# Patient Record
Sex: Female | Born: 1935 | Race: White | Hispanic: No | Smoking: Never smoker
Health system: Southern US, Community
[De-identification: ages and names within clinical notes are randomized; demographics above are authoritative.]

## PROBLEM LIST (undated history)

## (undated) DIAGNOSIS — E785 Hyperlipidemia, unspecified: Secondary | ICD-10-CM

## (undated) DIAGNOSIS — I639 Cerebral infarction, unspecified: Secondary | ICD-10-CM

## (undated) HISTORY — DX: Hyperlipidemia, unspecified: E78.5

---

## 2006-10-06 ENCOUNTER — Emergency Department: Payer: Self-pay | Admitting: Emergency Medicine

## 2006-10-24 ENCOUNTER — Ambulatory Visit: Payer: Self-pay | Admitting: Orthopedic Surgery

## 2009-03-03 ENCOUNTER — Ambulatory Visit: Payer: Self-pay | Admitting: Obstetrics and Gynecology

## 2009-03-26 ENCOUNTER — Ambulatory Visit: Payer: Self-pay | Admitting: Obstetrics and Gynecology

## 2010-06-29 ENCOUNTER — Ambulatory Visit: Payer: Self-pay | Admitting: Ophthalmology

## 2010-07-06 ENCOUNTER — Ambulatory Visit: Payer: Self-pay | Admitting: Ophthalmology

## 2010-07-20 ENCOUNTER — Ambulatory Visit: Payer: Self-pay | Admitting: Ophthalmology

## 2017-06-15 ENCOUNTER — Emergency Department: Payer: Medicare Other

## 2017-06-15 ENCOUNTER — Inpatient Hospital Stay (HOSPITAL_COMMUNITY): Payer: Medicare Other

## 2017-06-15 ENCOUNTER — Encounter: Payer: Self-pay | Admitting: Emergency Medicine

## 2017-06-15 ENCOUNTER — Inpatient Hospital Stay (HOSPITAL_COMMUNITY)
Admission: EM | Admit: 2017-06-15 | Discharge: 2017-06-20 | DRG: 041 | Disposition: A | Discharge status: Rehabilitation Facility | Payer: Medicare Other | Attending: Neurology | Admitting: Neurology

## 2017-06-15 ENCOUNTER — Other Ambulatory Visit: Payer: Self-pay

## 2017-06-15 ENCOUNTER — Emergency Department
Admission: EM | Admit: 2017-06-15 | Discharge: 2017-06-15 | Disposition: A | Discharge status: Other Acute Inpatient Hospital | Payer: Medicare Other | Attending: Emergency Medicine | Admitting: Emergency Medicine

## 2017-06-15 DIAGNOSIS — I340 Nonrheumatic mitral (valve) insufficiency: Secondary | ICD-10-CM | POA: Diagnosis not present

## 2017-06-15 DIAGNOSIS — I63432 Cerebral infarction due to embolism of left posterior cerebral artery: Secondary | ICD-10-CM | POA: Diagnosis present

## 2017-06-15 DIAGNOSIS — K219 Gastro-esophageal reflux disease without esophagitis: Secondary | ICD-10-CM | POA: Diagnosis present

## 2017-06-15 DIAGNOSIS — E785 Hyperlipidemia, unspecified: Secondary | ICD-10-CM | POA: Diagnosis not present

## 2017-06-15 DIAGNOSIS — I6300 Cerebral infarction due to thrombosis of unspecified precerebral artery: Secondary | ICD-10-CM | POA: Diagnosis not present

## 2017-06-15 DIAGNOSIS — I639 Cerebral infarction, unspecified: Secondary | ICD-10-CM | POA: Insufficient documentation

## 2017-06-15 DIAGNOSIS — I6350 Cerebral infarction due to unspecified occlusion or stenosis of unspecified cerebral artery: Secondary | ICD-10-CM

## 2017-06-15 DIAGNOSIS — R29706 NIHSS score 6: Secondary | ICD-10-CM | POA: Diagnosis present

## 2017-06-15 DIAGNOSIS — I6329 Cerebral infarction due to unspecified occlusion or stenosis of other precerebral arteries: Secondary | ICD-10-CM | POA: Diagnosis not present

## 2017-06-15 DIAGNOSIS — I361 Nonrheumatic tricuspid (valve) insufficiency: Secondary | ICD-10-CM | POA: Diagnosis not present

## 2017-06-15 DIAGNOSIS — D649 Anemia, unspecified: Secondary | ICD-10-CM | POA: Diagnosis present

## 2017-06-15 DIAGNOSIS — R410 Disorientation, unspecified: Secondary | ICD-10-CM | POA: Diagnosis present

## 2017-06-15 DIAGNOSIS — R531 Weakness: Secondary | ICD-10-CM | POA: Diagnosis present

## 2017-06-15 DIAGNOSIS — I69320 Aphasia following cerebral infarction: Secondary | ICD-10-CM | POA: Diagnosis not present

## 2017-06-15 DIAGNOSIS — I69351 Hemiplegia and hemiparesis following cerebral infarction affecting right dominant side: Secondary | ICD-10-CM | POA: Diagnosis not present

## 2017-06-15 DIAGNOSIS — R29810 Facial weakness: Secondary | ICD-10-CM | POA: Diagnosis not present

## 2017-06-15 DIAGNOSIS — I69319 Unspecified symptoms and signs involving cognitive functions following cerebral infarction: Secondary | ICD-10-CM | POA: Diagnosis not present

## 2017-06-15 DIAGNOSIS — I69398 Other sequelae of cerebral infarction: Secondary | ICD-10-CM | POA: Diagnosis not present

## 2017-06-15 DIAGNOSIS — R29703 NIHSS score 3: Secondary | ICD-10-CM | POA: Diagnosis not present

## 2017-06-15 DIAGNOSIS — I10 Essential (primary) hypertension: Secondary | ICD-10-CM | POA: Diagnosis not present

## 2017-06-15 DIAGNOSIS — I6389 Other cerebral infarction: Secondary | ICD-10-CM | POA: Diagnosis not present

## 2017-06-15 DIAGNOSIS — R270 Ataxia, unspecified: Secondary | ICD-10-CM | POA: Diagnosis present

## 2017-06-15 DIAGNOSIS — H9190 Unspecified hearing loss, unspecified ear: Secondary | ICD-10-CM | POA: Diagnosis present

## 2017-06-15 DIAGNOSIS — G8321 Monoplegia of upper limb affecting right dominant side: Secondary | ICD-10-CM | POA: Diagnosis present

## 2017-06-15 DIAGNOSIS — R269 Unspecified abnormalities of gait and mobility: Secondary | ICD-10-CM | POA: Diagnosis not present

## 2017-06-15 DIAGNOSIS — I69390 Apraxia following cerebral infarction: Secondary | ICD-10-CM | POA: Diagnosis not present

## 2017-06-15 DIAGNOSIS — W1839XA Other fall on same level, initial encounter: Secondary | ICD-10-CM | POA: Diagnosis present

## 2017-06-15 DIAGNOSIS — R414 Neurologic neglect syndrome: Secondary | ICD-10-CM | POA: Diagnosis not present

## 2017-06-15 DIAGNOSIS — E538 Deficiency of other specified B group vitamins: Secondary | ICD-10-CM | POA: Diagnosis present

## 2017-06-15 DIAGNOSIS — K5900 Constipation, unspecified: Secondary | ICD-10-CM | POA: Diagnosis present

## 2017-06-15 DIAGNOSIS — I63332 Cerebral infarction due to thrombosis of left posterior cerebral artery: Secondary | ICD-10-CM | POA: Diagnosis not present

## 2017-06-15 DIAGNOSIS — R739 Hyperglycemia, unspecified: Secondary | ICD-10-CM | POA: Diagnosis not present

## 2017-06-15 DIAGNOSIS — Z9282 Status post administration of tPA (rtPA) in a different facility within the last 24 hours prior to admission to current facility: Secondary | ICD-10-CM

## 2017-06-15 DIAGNOSIS — I63532 Cerebral infarction due to unspecified occlusion or stenosis of left posterior cerebral artery: Secondary | ICD-10-CM | POA: Diagnosis not present

## 2017-06-15 HISTORY — DX: Cerebral infarction, unspecified: I63.9

## 2017-06-15 LAB — DIFFERENTIAL
BASOS PCT: 1 %
Basophils Absolute: 0 10*3/uL (ref 0–0.1)
EOS PCT: 2 %
Eosinophils Absolute: 0.1 10*3/uL (ref 0–0.7)
Lymphocytes Relative: 45 %
Lymphs Abs: 2 10*3/uL (ref 1.0–3.6)
MONO ABS: 0.4 10*3/uL (ref 0.2–0.9)
Monocytes Relative: 9 %
NEUTROS ABS: 1.9 10*3/uL (ref 1.4–6.5)
Neutrophils Relative %: 43 %

## 2017-06-15 LAB — COMPREHENSIVE METABOLIC PANEL
ALT: 19 U/L (ref 14–54)
AST: 33 U/L (ref 15–41)
Albumin: 4.1 g/dL (ref 3.5–5.0)
Alkaline Phosphatase: 76 U/L (ref 38–126)
Anion gap: 11 (ref 5–15)
BUN: 18 mg/dL (ref 6–20)
CALCIUM: 9.3 mg/dL (ref 8.9–10.3)
CHLORIDE: 105 mmol/L (ref 101–111)
CO2: 22 mmol/L (ref 22–32)
CREATININE: 0.76 mg/dL (ref 0.44–1.00)
Glucose, Bld: 127 mg/dL — ABNORMAL HIGH (ref 65–99)
Potassium: 4.1 mmol/L (ref 3.5–5.1)
SODIUM: 138 mmol/L (ref 135–145)
Total Bilirubin: 0.4 mg/dL (ref 0.3–1.2)
Total Protein: 6.7 g/dL (ref 6.5–8.1)

## 2017-06-15 LAB — CBC
HEMATOCRIT: 37 % (ref 35.0–47.0)
Hemoglobin: 12.8 g/dL (ref 12.0–16.0)
MCH: 32.8 pg (ref 26.0–34.0)
MCHC: 34.6 g/dL (ref 32.0–36.0)
MCV: 94.7 fL (ref 80.0–100.0)
PLATELETS: 145 10*3/uL — AB (ref 150–440)
RBC: 3.91 MIL/uL (ref 3.80–5.20)
RDW: 12.9 % (ref 11.5–14.5)
WBC: 4.4 10*3/uL (ref 3.6–11.0)

## 2017-06-15 LAB — ETHANOL

## 2017-06-15 LAB — TROPONIN I

## 2017-06-15 LAB — PROTIME-INR
INR: 0.97
PROTHROMBIN TIME: 12.8 s (ref 11.4–15.2)

## 2017-06-15 LAB — APTT: aPTT: 29 seconds (ref 24–36)

## 2017-06-15 MED ORDER — CLEVIDIPINE BUTYRATE 0.5 MG/ML IV EMUL: 0.0000 mg/h | INTRAVENOUS | Status: DC

## 2017-06-15 MED ORDER — SENNOSIDES-DOCUSATE SODIUM 8.6-50 MG PO TABS: 1.0000 | ORAL_TABLET | Freq: Every evening | ORAL | Status: DC | PRN

## 2017-06-15 MED ORDER — LABETALOL HCL 5 MG/ML IV SOLN
20.0000 mg | Freq: Once | INTRAVENOUS | Status: AC
  Administered 2017-06-15: 20 mg via INTRAVENOUS

## 2017-06-15 MED ORDER — ALTEPLASE 100 MG IV SOLR
INTRAVENOUS | Status: AC
  Administered 2017-06-15: 57 mg via INTRAVENOUS
  Filled 2017-06-15: qty 100

## 2017-06-15 MED ORDER — STROKE: EARLY STAGES OF RECOVERY BOOK
Freq: Once | Status: AC
  Administered 2017-06-16: 02:00:00
  Filled 2017-06-15: qty 1

## 2017-06-15 MED ORDER — ACETAMINOPHEN 650 MG RE SUPP: 650.0000 mg | RECTAL | Status: DC | PRN

## 2017-06-15 MED ORDER — ACETAMINOPHEN 160 MG/5ML PO SOLN: 650.0000 mg | ORAL | Status: DC | PRN

## 2017-06-15 MED ORDER — ACETAMINOPHEN 325 MG PO TABS: 650.0000 mg | ORAL_TABLET | ORAL | Status: DC | PRN

## 2017-06-15 MED ORDER — LABETALOL HCL 5 MG/ML IV SOLN
INTRAVENOUS | Status: AC
  Administered 2017-06-15: 20 mg via INTRAVENOUS
  Filled 2017-06-15: qty 4

## 2017-06-15 MED ORDER — IOPAMIDOL (ISOVUE-370) INJECTION 76%
100.0000 mL | Freq: Once | INTRAVENOUS | Status: AC | PRN
  Administered 2017-06-15: 100 mL via INTRAVENOUS

## 2017-06-15 MED ORDER — SODIUM CHLORIDE 0.9 % IV SOLN
INTRAVENOUS | Status: AC
  Administered 2017-06-16: 02:00:00 via INTRAVENOUS
  Administered 2017-06-16: 75 mL/h via INTRAVENOUS

## 2017-06-15 MED ORDER — PANTOPRAZOLE SODIUM 40 MG IV SOLR
40.0000 mg | Freq: Every day | INTRAVENOUS | Status: DC
  Administered 2017-06-16: 40 mg via INTRAVENOUS
  Filled 2017-06-15: qty 40

## 2017-06-15 MED ORDER — ALTEPLASE (STROKE) FULL DOSE INFUSION
0.9000 mg/kg | Freq: Once | INTRAVENOUS | Status: AC
  Administered 2017-06-15: 57 mg via INTRAVENOUS
  Filled 2017-06-15: qty 100

## 2017-06-15 MED ORDER — LABETALOL HCL 5 MG/ML IV SOLN: 20.0000 mg | Freq: Once | INTRAVENOUS | Status: DC

## 2017-06-15 MED ORDER — NICARDIPINE HCL IN NACL 20-0.86 MG/200ML-% IV SOLN
INTRAVENOUS | Status: AC
  Filled 2017-06-15: qty 200

## 2017-06-15 MED ORDER — SODIUM CHLORIDE 0.9 % IV SOLN: 50.0000 mL | Freq: Once | INTRAVENOUS | Status: DC

## 2017-06-15 MED ORDER — LABETALOL HCL 5 MG/ML IV SOLN
INTRAVENOUS | Status: AC
  Administered 2017-06-15: 20 mg
  Filled 2017-06-15: qty 4

## 2017-06-15 NOTE — Plan of Care (Signed)
Was called about this patient by Dr. Shaune PollackLord. 82 year old female presents with right visual field defect- status post TPA after tele neurology consult. Recommended stat CTA head and neck to rule out LVO as patient also had some mild aphasia in additional to right homonymous hemianopsia and mild right-sided weakness. Perfusion scan appears to have been done as well.   CTA to me shows a left P2 occlusion with small perfusion deficit in the left PCA territory.  She will not be a candidate for EMT. She'll be admitted to neuro ICU for further workup.

## 2017-06-15 NOTE — ED Triage Notes (Signed)
Pt transferred from [facility] via [facility]. Per [facility] LSW [id number] when pt had a fall and was found with right arm numbness and dizziness. [facility] she had garbled speech and right peripheral vision loss. Pt was given a total does of 57 TPA started at 1817 and stopped at approx 1905. Pt also given 2 doses of labetolol totaling 40mg  At unknown times. She had CT and CTA PTA to this facility. Pt has 18LAC 20RAC and 20Rwrist. Pt still has numbness and peripheral vision loss.

## 2017-06-15 NOTE — ED Notes (Signed)
MD requesting medication administration to lower BP prior to alteplase administration.

## 2017-06-15 NOTE — ED Notes (Signed)
Pt Blood pressure to high to give TPA. Labetalol is ordered.

## 2017-06-15 NOTE — ED Notes (Signed)
Spoke with Dr. Otelia LimesLindzen to check on bed request order for ICU s/p TPA

## 2017-06-15 NOTE — ED Notes (Signed)
Patient transported to CT with nurse. MD aware that pt is leaving room for CT scan.

## 2017-06-15 NOTE — ED Notes (Signed)
Pt blood pressure to elevated to give TPA. Labetalol ordered.

## 2017-06-15 NOTE — ED Provider Notes (Addendum)
[facility] Emergency Department Provider Note ____________________________________________   I have reviewed the triage vital signs and the triage nursing note.  HISTORY  Chief Complaint Cerebrovascular Accident   Historian Level 5 Caveat History Limited by patient with altered mental status. Granddaughter/niece provides history, she lives next door. Sister also came to the bedside Daughter by phone from [place]  HPI [name] is a 82 y.o. female with no history of known medical problems, lives alone and is very sharp according to family.  She lives next door to her great niece who his children were over at her house this afternoon when the patient apparently lowered herself to the floor.  The 52-year-old called her mother the patient's grand niece who came over immediately from next door and found the patient to be complaining of right arm and right leg numbness and stating that she could not feel them although she was moving all 4 extremities.  911was called and when EMS got there they report no focal weakness or numbness at that point but the patient was having some trouble following commands and confusion concerning for aphasia.  Symptoms are severe.   History reviewed. No pertinent past medical history.  There are no active problems to display for this patient.   History reviewed. No pertinent surgical history.  Prior to Admission medications   Not on File    No Known Allergies  No family history on file.  Social History Social History   Tobacco Use  . Smoking status: Never Smoker  Substance Use Topics  . Alcohol use: No    Frequency: Never  . Drug use: No    Review of Systems  Constitutional: Negative for recent illness. Eyes: Denies vision changes ENT: Negative for sore throat. Cardiovascular: Negative for chest pain. Respiratory: Negative for shortness of breath. Gastrointestinal: Negative for abdominal pain, vomiting and  diarrhea. Genitourinary: Negative for dysuria. Musculoskeletal: Negative for back pain. Skin: Negative for rash. Neurological: Negative for headache.  Positive for dizziness.  ____________________________________________   PHYSICAL EXAM:  VITAL SIGNS: ED Triage Vitals  Enc Vitals Group     BP      Pulse      Resp      Temp      Temp src      SpO2      Weight      Height      Head Circumference      Peak Flow      Pain Score      Pain Loc      Pain Edu?      Excl. in GC?      Constitutional: Alert and cooperative, but somewhat slow to answer questions, appears to have receptive aphasia. Well appearing and in no distress. HEENT   Head: Normocephalic and atraumatic.      Eyes: Conjunctivae are normal. Pupils equal and round.  Right sided hemi-anopsia      Ears:         Nose: No congestion/rhinnorhea.   Mouth/Throat: Mucous membranes are moist.   Neck: No stridor. Cardiovascular/Chest: Normal rate, regular rhythm.  No murmurs, rubs, or gallops. Respiratory: Normal respiratory effort without tachypnea nor retractions. Breath sounds are clear and equal bilaterally. No wheezes/rales/rhonchi. Gastrointestinal: Soft. No distention, no guarding, no rebound. Nontender.    Genitourinary/rectal:Deferred Musculoskeletal: Nontender with normal range of motion in all extremities. No joint effusions.  No lower extremity tenderness.  No edema. Neurologic: Slow to answer questions.  Initially had  problems following commands consistent with receptive aphasia.  Initially was unable to name common objects.  No drift or obvious motor deficit in 4 extremities.  On arrival had mild left-sided facial droop questionably.  No apparent sensory deficit, she stated that her sensation was normal bilaterally.  Skin:  Skin is warm, dry and intact. No rash noted. Psychiatric: No agitation   ____________________________________________  LABS (pertinent positives/negatives) I, Governor Rooks,  MD the attending physician have reviewed the labs noted below.  Labs Reviewed  CBC - Abnormal; Notable for the following components:      Result Value   Platelets 145 (*)    All other components within normal limits  COMPREHENSIVE METABOLIC PANEL - Abnormal; Notable for the following components:   Glucose, Bld 127 (*)    All other components within normal limits  ETHANOL  PROTIME-INR  APTT  DIFFERENTIAL  TROPONIN I  URINE DRUG SCREEN, QUALITATIVE ([facility] ONLY)  URINALYSIS, ROUTINE W REFLEX MICROSCOPIC    ____________________________________________    EKG I, Governor Rooks, MD, the attending physician have personally viewed and interpreted all ECGs.  None ____________________________________________  RADIOLOGY All Xrays were viewed by me.  Imaging interpreted by Radiologist, and I, Governor Rooks, MD the attending physician have reviewed the radiologist interpretation noted below.  Ct head without contrast:  IMPRESSION: Negative head CT.  These results were called by telephone at the time of interpretation on 06/15/2017 at 5:27 pm to Dr. Governor Rooks , who verbally acknowledged these results.  CT angios head and neck as well as CT perfusion with contrast: Pending __________________________________________  PROCEDURES  Procedure(s) performed: None  Critical Care performed: CRITICAL CARE Performed by: Governor Rooks   Total critical care time: 110 minutes  Critical care time was exclusive of separately billable procedures and treating other patients.  Critical care was necessary to treat or prevent imminent or life-threatening deterioration.  Critical care was time spent personally by me on the following activities: development of treatment plan with patient and/or surrogate as well as nursing, discussions with consultants, evaluation of patient's response to treatment, examination of patient, obtaining history from patient or surrogate, ordering and performing  treatments and interventions, ordering and review of laboratory studies, ordering and review of radiographic studies, pulse oximetry and re-evaluation of patient's condition.    ____________________________________________  ED COURSE / ASSESSMENT AND PLAN  Pertinent labs & imaging results that were available during my care of the patient were reviewed by me and considered in my medical decision making (see chart for details).    Patient arrived with complaint of numbness to the right arm and right leg with acute mental status change with receptive and expressive aphasia while here.  Patient was initiated as a code stroke.  CT head was obtained expeditiously and I discussed with the radiologist and reviewed myself and this was negative.  On-call tele-neurologist was consulted and although the patient was actually improved from the standpoint of her receptive and expressive aphasia, and had no weakness or numbness on exam, she did have a focal right sided hemianopsia, right side visual field deficit in both eyes.  Risk and benefit of TPA was discussed with patient as well as her family including her sister and nephew in the room, and her daughter by phone.  Patient states she wants to try TPA.  Patient did have elevated blood pressure when she came in and got 1 dose of IV labetalol, and then had increased blood pressure again requiring second dose of IV labetalol.  Plan to start nicardipine drip.  I initiated transfer with [facility] neurology, Dr. Laurence SlateAroor, who accepted immediately and [facility] transport was sent.  Patient sent for CT angios head and neck to be done so that this could be reviewed by accepting neurologist to make decisions for potential large vessel intervention if needed as patient is in transport to [facility] to ED versus ICU depending on these imaging results.  Patient to be transported now despite results not back here.  Most of her neurologist has access to the  imaging.  CONSULTATIONS: Code stroke tele-neurologist, agree with recommendation for TPA.  Patient / Family / Caregiver informed of clinical course, medical decision-making process, and agree with plan.    ___________________________________________   FINAL CLINICAL IMPRESSION(S) / ED DIAGNOSES   Final diagnoses:  Cerebrovascular accident (CVA), unspecified mechanism (HCC)      ___________________________________________        Note: This dictation was prepared with Office managerDragon dictation. Any transcriptional errors that result from this process are unintentional    Governor RooksLord, Ellan Tess, MD 06/15/17 1840    Governor RooksLord, Amore Grater, MD 06/15/17 1924

## 2017-06-15 NOTE — ED Provider Notes (Signed)
[facility] EMERGENCY [facility]PART[facility] Provider Note   CSN: [id number] Arrival date & time: 06/15/17  [id number]     History   Chief Complaint No chief complaint on file.   HPI [name] is a 82 y.o. female.  HPI  82 year old female presents from [facility] as a transfer as a code stroke.  History is limited as the patient is mildly confused.  History is supplemented by the nurse who took transfer report as well as family at the bedside.  At around 3 PM today she all of a sudden felt dizzy and laid herself down to the ground.  She has been having right-sided vision loss and was complaining of right arm and leg numbness.  There was no apparent weakness.  She was given TPA at the outside facility and had a CT scan, CT angiography, and CT perfusion.  Neurology accepted patient as transfer.  No past medical history on file.  There are no active problems to display for this patient.   No past surgical history on file.  OB History    No data available       Home Medications    Prior to Admission medications   Not on File    Family History No family history on file.  Social History Social History   Tobacco Use  . Smoking status: Never Smoker  Substance Use Topics  . Alcohol use: No    Frequency: Never  . Drug use: No     Allergies   Patient has no known allergies.   Review of Systems Review of Systems  Unable to perform ROS: Mental status change     Physical Exam Updated Vital Signs BP (!) 172/82   Pulse 67   Temp 98.4 F (36.9 C) (Oral)   Resp 19   SpO2 99%   Physical Exam  Constitutional: She appears well-developed and well-nourished.  HENT:  Head: Normocephalic and atraumatic.  Right Ear: External ear normal.  Left Ear: External ear normal.  Nose: Nose normal.  Eyes: EOM are normal. Pupils are equal, round, and reactive to light. Right eye exhibits no discharge. Left eye exhibits no discharge.  Right  hemianopsia  Neck: Neck supple.  Cardiovascular: Normal rate, regular rhythm and normal heart sounds.  Pulmonary/Chest: Effort normal and breath sounds normal.  Abdominal: Soft. There is no tenderness.  Neurological: She is alert.  Awake, alert, but mild confusion. CN 3-12 grossly intact. 5/5 strength in all 4 extremities. Grossly normal sensation.   Skin: Skin is warm and dry.  Nursing note and vitals reviewed.    ED Treatments / Results  Labs (all labs ordered are listed, but only abnormal results are displayed) Labs Reviewed - No data to display  EKG  EKG Interpretation None       Radiology Ct Angio Head W Or Wo Contrast  Result Date: 06/15/2017 CLINICAL DATA:  Fall and right arm numbness with aphasia that resolved. Right homonymous hemianopia. EXAM: CT ANGIOGRAPHY HEAD AND NECK CT PERFUSION BRAIN TECHNIQUE: Multidetector CT imaging of the head and neck was performed using the standard protocol during bolus administration of intravenous contrast. Multiplanar CT image reconstructions and MIPs were obtained to evaluate the vascular anatomy. Carotid stenosis measurements (when applicable) are obtained utilizing NASCET criteria, using the distal internal carotid diameter as the denominator. Multiphase CT imaging of the brain was performed following IV bolus contrast injection. Subsequent parametric perfusion maps were calculated using RAPID software. CONTRAST:  100mL ISOVUE-370 IOPAMIDOL (  ISOVUE-370) INJECTION 76% COMPARISON:  Noncontrast head CT earlier today FINDINGS: CTA NECK FINDINGS Aortic arch: No acute finding.  Three vessel branching. Right carotid system: Mild atherosclerotic plaque at the ICA bulb. No beading, stenosis, or ulceration. Left carotid system: Mild atherosclerotic plaque at the common carotid bifurcation and ICA bulb. No stenosis, ulceration, or beading. Vertebral arteries: No proximal subclavian stenosis. Mild atheromatous plaque at the vertebral origins. No stenosis  or beading. Skeleton: No acute finding. Other neck: No incidental mass or inflammation. Upper chest: Negative Review of the MIP images confirms the above findings CTA HEAD FINDINGS Anterior circulation: No noted atheromatous changes. No branch occlusion or stenosis. Negative for beading or aneurysm. Posterior circulation: Vertebral and basilar arteries are smooth and widely patent. There is a left P2 segment occlusion or pre occlusion with flow gap. Venous sinuses: Patent Anatomic variants: None significant Delayed phase: No abnormal intracranial enhancement. Review of the MIP images confirms the above findings CT Brain Perfusion Findings: CBF (<30%) Volume: 0mL Perfusion (Tmax>6.0s) volume: 13mL These results were called by telephone at the time of interpretation on 06/15/2017 at 7:08 pm to Dr. Shaune Pollack. Patient has already been given tPA and is being transferred. IMPRESSION: 1. Left P2 segment occlusion with downstream reconstitution. No infarct by CTP but there is 13cc of measurable left occipital lobe penumbra. 2. Very mild atheromatous changes for age. No stenosis or noted embolic souce. Electronically Signed   By: Marnee Spring M.D.   On: 06/15/2017 19:20   Ct Head Wo Contrast  Result Date: 06/15/2017 CLINICAL DATA:  Altered level of consciousness.  Fall.  Aphasia. EXAM: CT HEAD WITHOUT CONTRAST TECHNIQUE: Contiguous axial images were obtained from the base of the skull through the vertex without intravenous contrast. COMPARISON:  None. FINDINGS: Brain: There is no evidence of acute infarct, intracranial hemorrhage, mass, midline shift, or extra-axial fluid collection. The ventricles and sulci are normal. Vascular: No hyperdense vessel. Skull: No fracture or suspicious osseous lesion. 5 mm sclerotic focus in the left frontal bone, possibly a bone island. Sinuses/Orbits: Visualized paranasal sinuses and mastoid air cells are clear. Bilateral cataract extraction. Other: None. IMPRESSION: Negative head CT. These  results were called by telephone at the time of interpretation on 06/15/2017 at 5:27 pm to Dr. Governor Rooks , who verbally acknowledged these results. Electronically Signed   By: Sebastian Ache M.D.   On: 06/15/2017 17:28   Ct Angio Neck W Or Wo Contrast  Result Date: 06/15/2017 CLINICAL DATA:  Fall and right arm numbness with aphasia that resolved. Right homonymous hemianopia. EXAM: CT ANGIOGRAPHY HEAD AND NECK CT PERFUSION BRAIN TECHNIQUE: Multidetector CT imaging of the head and neck was performed using the standard protocol during bolus administration of intravenous contrast. Multiplanar CT image reconstructions and MIPs were obtained to evaluate the vascular anatomy. Carotid stenosis measurements (when applicable) are obtained utilizing NASCET criteria, using the distal internal carotid diameter as the denominator. Multiphase CT imaging of the brain was performed following IV bolus contrast injection. Subsequent parametric perfusion maps were calculated using RAPID software. CONTRAST:  ISOVUE-370 IOPAMIDOL (ISOVUE-370) INJECTION 76% COMPARISON:  Noncontrast head CT earlier today FINDINGS: CTA NECK FINDINGS Aortic arch: No acute finding.  Three vessel branching. Right carotid system: Mild atherosclerotic plaque at the ICA bulb. No beading, stenosis, or ulceration. Left carotid system: Mild atherosclerotic plaque at the common carotid bifurcation and ICA bulb. No stenosis, ulceration, or beading. Vertebral arteries: No proximal subclavian stenosis. Mild atheromatous plaque at the vertebral origins. No stenosis or beading.  Skeleton: No acute finding. Other neck: No incidental mass or inflammation. Upper chest: Negative Review of the MIP images confirms the above findings CTA HEAD FINDINGS Anterior circulation: No noted atheromatous changes. No branch occlusion or stenosis. Negative for beading or aneurysm. Posterior circulation: Vertebral and basilar arteries are smooth and widely patent. There is a left P2  segment occlusion or pre occlusion with flow gap. Venous sinuses: Patent Anatomic variants: None significant Delayed phase: No abnormal intracranial enhancement. Review of the MIP images confirms the above findings CT Brain Perfusion Findings: CBF (<30%) Volume: 0mL Perfusion (Tmax>6.0s) volume: 13mL These results were called by telephone at the time of interpretation on 06/15/2017 at 7:08 pm to Dr. Shaune Pollack. Patient has already been given tPA and is being transferred. IMPRESSION: 1. Left P2 segment occlusion with downstream reconstitution. No infarct by CTP but there is 13cc of measurable left occipital lobe penumbra. 2. Very mild atheromatous changes for age. No stenosis or noted embolic souce. Electronically Signed   By: Marnee Spring M.D.   On: 06/15/2017 19:20   Ct Cerebral Perfusion W Contrast  Result Date: 06/15/2017 CLINICAL DATA:  Fall and right arm numbness with aphasia that resolved. Right homonymous hemianopia. EXAM: CT ANGIOGRAPHY HEAD AND NECK CT PERFUSION BRAIN TECHNIQUE: Multidetector CT imaging of the head and neck was performed using the standard protocol during bolus administration of intravenous contrast. Multiplanar CT image reconstructions and MIPs were obtained to evaluate the vascular anatomy. Carotid stenosis measurements (when applicable) are obtained utilizing NASCET criteria, using the distal internal carotid diameter as the denominator. Multiphase CT imaging of the brain was performed following IV bolus contrast injection. Subsequent parametric perfusion maps were calculated using RAPID software. CONTRAST:  ISOVUE-370 IOPAMIDOL (ISOVUE-370) INJECTION 76% COMPARISON:  Noncontrast head CT earlier today FINDINGS: CTA NECK FINDINGS Aortic arch: No acute finding.  Three vessel branching. Right carotid system: Mild atherosclerotic plaque at the ICA bulb. No beading, stenosis, or ulceration. Left carotid system: Mild atherosclerotic plaque at the common carotid bifurcation and ICA bulb. No  stenosis, ulceration, or beading. Vertebral arteries: No proximal subclavian stenosis. Mild atheromatous plaque at the vertebral origins. No stenosis or beading. Skeleton: No acute finding. Other neck: No incidental mass or inflammation. Upper chest: Negative Review of the MIP images confirms the above findings CTA HEAD FINDINGS Anterior circulation: No noted atheromatous changes. No branch occlusion or stenosis. Negative for beading or aneurysm. Posterior circulation: Vertebral and basilar arteries are smooth and widely patent. There is a left P2 segment occlusion or pre occlusion with flow gap. Venous sinuses: Patent Anatomic variants: None significant Delayed phase: No abnormal intracranial enhancement. Review of the MIP images confirms the above findings CT Brain Perfusion Findings: CBF (<30%) Volume: 0mL Perfusion (Tmax>6.0s) volume: 13mL These results were called by telephone at the time of interpretation on 06/15/2017 at 7:08 pm to Dr. Shaune Pollack. Patient has already been given tPA and is being transferred. IMPRESSION: 1. Left P2 segment occlusion with downstream reconstitution. No infarct by CTP but there is 13cc of measurable left occipital lobe penumbra. 2. Very mild atheromatous changes for age. No stenosis or noted embolic souce. Electronically Signed   By: Marnee Spring M.D.   On: 06/15/2017 19:20    Procedures Procedures (including critical care time)  Medications Ordered in ED Medications - No data to display   Initial Impression / Assessment and Plan / ED Course  I have reviewed the triage vital signs and the nursing notes.  Pertinent labs & imaging results that were  available during my care of the patient were reviewed by me and considered in my medical decision making (see chart for details).     Patient is awake, alert, mild confusion but this is not changed since 3 PM.  She does not feel numbness anymore but still has the right-sided vision loss.  She denies any headache but does feel  little dizzy.  At this point, she appears stable and somewhat improving since her trip to the outside ED.  Dr. Otelia Limes aware patient will admit to the ICU.  Reviewed patient's CT scans with him and no indication for IR treatment based on P2 clot.  Final Clinical Impressions(s) / ED Diagnoses   Final diagnoses:  Posterior circulation stroke [facility])    ED Discharge Orders    None       Pricilla Loveless, MD 06/15/17 2049

## 2017-06-15 NOTE — H&P (Signed)
Admission H&P    Chief Complaint: Left MCA stroke  HPI: [name] is an 82 y.o. female who was in her [place] on Thursday when she suddenly felt dizzy and had a "controlled fall" at 1545. She was unable to get up by herself and family noted her to be weak on the right. She was complaining of right arm numbness and dizziness. EMS transported the patient to [facility]. On arrival, she had garbled speech and right peripheral vision loss. CT head was negative for ICH. She was diagnosed with an acute stroke and tPA infusion started. She had CTA prior to transfer to [facility]. On arrival to [facility], the patient still had numbness and peripheral vision loss.   CTA head and neck with CTP:  1. Left P2 segment occlusion with downstream reconstitution. No infarct by CTP but there is 13cc of measurable left occipital lobe penumbra. 2. Very mild atheromatous changes for age. No stenosis or noted embolic souce.  LSN: [id number] tPA Given: Yes. Administered at [facility]  No past medical history on file.  No past surgical history on file.  No family history on file. Social History:  reports that  has never smoked. She does not have any smokeless tobacco history on file. She reports that she does not drink alcohol or use drugs.  Allergies: No Known Allergies  Home medications: She is not on any medications per family  ROS: No chest pain. Denies confusion. Other ROS as per HPI.   Physical Examination: Blood pressure (!) 165/96, pulse 66, temperature 98.4 F (36.9 C), temperature source Oral, resp. rate (!) 21, SpO2 99 %.  HEENT-  [place]/AT  Lungs - Respirations unlabored Extremities - No edema  Neurologic Examination: Mental Status: Awake and alert. Oriented to self, circumstance and state. Mild loss of intelligibility noted intermittently in the context of preserved fluency. Has difficulty with directional 2-step commands. Some impairment with naming. Cranial Nerves: II:  Right homonymous hemianopsia. PERRL.  III,IV, VI:  No ptosis. Able to gaze to left and right, with hesitancy on gazing to right. No nystagmus. V,VII: Subtle right facial droop. Facial FT sensation equal bilaterally.  VIII: Hearing intact to questions and commands IX,X: No hypophonia or hoarseness XI: No lag on left or right.  XII: midline tongue extension  Motor: RUE: Decreased tone. 4/5 proximal and distal strength.  RLE: 4+/5 proximal and distal LUE and LLE: 5/5 Sensory: FT sensation intact all 4 limbs when tested individually. Right sided extinction with DSS.  Deep Tendon Reflexes:  Right br and biceps: 3+ Left br and biceps: 2+ Right patella 3+ Right achilles: 2+ Left patella: 3+ Left achilles: 4+ (crossed adductor) Plantars: Equivocal bilaterally Cerebellar: Sensory ataxia with right FNF. Normal FNF on left.  Gait: Deferred due to acuity of presentation  Results for orders placed or performed during the hospital encounter of 06/15/17 (from the past 48 hour(s))  Ethanol     Status: None   Collection Time: 06/15/17  5:07 PM  Result Value Ref Range   Alcohol, Ethyl (B) <10 <10 mg/dL    Comment:        LOWEST DETECTABLE LIMIT FOR SERUM ALCOHOL IS 10 mg/dL FOR MEDICAL PURPOSES ONLY Performed at [facility], [address] [place], [place] [address]   Protime-INR     Status: None   Collection Time: 06/15/17  5:07 PM  Result Value Ref Range   Prothrombin Time 12.8 11.4 - 15.2 seconds   INR 0.97     Comment: Performed at  [facility], [address] [place], [place] [address]  APTT     Status: None   Collection Time: 06/15/17  5:07 PM  Result Value Ref Range   aPTT 29 24 - 36 seconds    Comment: Performed at [facility], [address] [place], [place] [address]  CBC     Status: Abnormal   Collection Time: 06/15/17  5:07 PM  Result Value Ref Range   WBC 4.4 3.6 - 11.0 K/uL   RBC 3.91 3.80 - 5.20 MIL/uL   Hemoglobin 12.8 12.0 - 16.0 g/dL   HCT 37.0 35.0 - 47.0 %   MCV 94.7 80.0 - 100.0  fL   MCH 32.8 26.0 - 34.0 pg   MCHC 34.6 32.0 - 36.0 g/dL   RDW 12.9 11.5 - 14.5 %   Platelets 145 (L) 150 - 440 K/uL    Comment: Performed at [facility], [address] [place], [place] [address]  Differential     Status: None   Collection Time: 06/15/17  5:07 PM  Result Value Ref Range   Neutrophils Relative % 43 %   Neutro Abs 1.9 1.4 - 6.5 K/uL   Lymphocytes Relative 45 %   Lymphs Abs 2.0 1.0 - 3.6 K/uL   Monocytes Relative 9 %   Monocytes Absolute 0.4 0.2 - 0.9 K/uL   Eosinophils Relative 2 %   Eosinophils Absolute 0.1 0 - 0.7 K/uL   Basophils Relative 1 %   Basophils Absolute 0.0 0 - 0.1 K/uL    Comment: Performed at [facility], [address] [place], [place] [address]  Comprehensive metabolic panel     Status: Abnormal   Collection Time: 06/15/17  5:07 PM  Result Value Ref Range   Sodium 138 135 - 145 mmol/L   Potassium 4.1 3.5 - 5.1 mmol/L   Chloride 105 101 - 111 mmol/L   CO2 22 22 - 32 mmol/L   Glucose, Bld 127 (H) 65 - 99 mg/dL   BUN 18 6 - 20 mg/dL   Creatinine, Ser 0.76 0.44 - 1.00 mg/dL   Calcium 9.3 8.9 - 10.3 mg/dL   Total Protein 6.7 6.5 - 8.1 g/dL   Albumin 4.1 3.5 - 5.0 g/dL   AST 33 15 - 41 U/L   ALT 19 14 - 54 U/L   Alkaline Phosphatase 76 38 - 126 U/L   Total Bilirubin 0.4 0.3 - 1.2 mg/dL   GFR calc non Af Amer >60 >60 mL/min   GFR calc Af Amer >60 >60 mL/min    Comment: (NOTE) The eGFR has been calculated using the CKD EPI equation. This calculation has not been validated in all clinical situations. eGFR's persistently <60 mL/min signify possible Chronic Kidney Disease.    Anion gap 11 5 - 15    Comment: Performed at [facility], [address] [place], [place] [address]  Troponin I     Status: None   Collection Time: 06/15/17  5:07 PM  Result Value Ref Range   Troponin I <0.03 <0.03 ng/mL    Comment: Performed at [facility], [address] [place], [place] [address]   Ct Angio Head W Or Wo  Contrast  Result Date: 06/15/2017 CLINICAL DATA:  Fall and right arm numbness with aphasia that resolved. Right homonymous hemianopia. EXAM: CT ANGIOGRAPHY HEAD AND NECK CT PERFUSION BRAIN TECHNIQUE: Multidetector CT imaging of the head and neck was performed using the standard protocol during bolus administration of  intravenous contrast. Multiplanar CT image reconstructions and MIPs were obtained to evaluate the vascular anatomy. Carotid stenosis measurements (when applicable) are obtained utilizing NASCET criteria, using the distal internal carotid diameter as the denominator. Multiphase CT imaging of the brain was performed following IV bolus contrast injection. Subsequent parametric perfusion maps were calculated using RAPID software. CONTRAST:  186m ISOVUE-370 IOPAMIDOL (ISOVUE-370) INJECTION 76% COMPARISON:  Noncontrast head CT earlier today FINDINGS: CTA NECK FINDINGS Aortic arch: No acute finding.  Three vessel branching. Right carotid system: Mild atherosclerotic plaque at the ICA bulb. No beading, stenosis, or ulceration. Left carotid system: Mild atherosclerotic plaque at the common carotid bifurcation and ICA bulb. No stenosis, ulceration, or beading. Vertebral arteries: No proximal subclavian stenosis. Mild atheromatous plaque at the vertebral origins. No stenosis or beading. Skeleton: No acute finding. Other neck: No incidental mass or inflammation. Upper chest: Negative Review of the MIP images confirms the above findings CTA HEAD FINDINGS Anterior circulation: No noted atheromatous changes. No branch occlusion or stenosis. Negative for beading or aneurysm. Posterior circulation: Vertebral and basilar arteries are smooth and widely patent. There is a left P2 segment occlusion or pre occlusion with flow gap. Venous sinuses: Patent Anatomic variants: None significant Delayed phase: No abnormal intracranial enhancement. Review of the MIP images confirms the above findings CT Brain Perfusion Findings:  CBF (<30%) Volume: 050mPerfusion (Tmax>6.0s) volume: 1365mhese results were called by telephone at the time of interpretation on 06/15/2017 at 7:08 pm to Dr. LorReita Clicheatient has already been given tPA and is being transferred. IMPRESSION: 1. Left P2 segment occlusion with downstream reconstitution. No infarct by CTP but there is 13cc of measurable left occipital lobe penumbra. 2. Very mild atheromatous changes for age. No stenosis or noted embolic souce. Electronically Signed   By: JonMonte FantasiaD.   On: 06/15/2017 19:20   Ct Head Wo Contrast  Result Date: 06/15/2017 CLINICAL DATA:  Altered level of consciousness.  Fall.  Aphasia. EXAM: CT HEAD WITHOUT CONTRAST TECHNIQUE: Contiguous axial images were obtained from the base of the skull through the vertex without intravenous contrast. COMPARISON:  None. FINDINGS: Brain: There is no evidence of acute infarct, intracranial hemorrhage, mass, midline shift, or extra-axial fluid collection. The ventricles and sulci are normal. Vascular: No hyperdense vessel. Skull: No fracture or suspicious osseous lesion. 5 mm sclerotic focus in the left frontal bone, possibly a bone island. Sinuses/Orbits: Visualized paranasal sinuses and mastoid air cells are clear. Bilateral cataract extraction. Other: None. IMPRESSION: Negative head CT. These results were called by telephone at the time of interpretation on 06/15/2017 at 5:27 pm to Dr. REBLisa Rocawho verbally acknowledged these results. Electronically Signed   By: AllLogan BoresD.   On: 06/15/2017 17:28   Ct Angio Neck W Or Wo Contrast  Result Date: 06/15/2017 CLINICAL DATA:  Fall and right arm numbness with aphasia that resolved. Right homonymous hemianopia. EXAM: CT ANGIOGRAPHY HEAD AND NECK CT PERFUSION BRAIN TECHNIQUE: Multidetector CT imaging of the head and neck was performed using the standard protocol during bolus administration of intravenous contrast. Multiplanar CT image reconstructions and MIPs were obtained to  evaluate the vascular anatomy. Carotid stenosis measurements (when applicable) are obtained utilizing NASCET criteria, using the distal internal carotid diameter as the denominator. Multiphase CT imaging of the brain was performed following IV bolus contrast injection. Subsequent parametric perfusion maps were calculated using RAPID software. CONTRAST:  100m48mOVUE-370 IOPAMIDOL (ISOVUE-370) INJECTION 76% COMPARISON:  Noncontrast head CT earlier today FINDINGS: CTA NECK  FINDINGS Aortic arch: No acute finding.  Three vessel branching. Right carotid system: Mild atherosclerotic plaque at the ICA bulb. No beading, stenosis, or ulceration. Left carotid system: Mild atherosclerotic plaque at the common carotid bifurcation and ICA bulb. No stenosis, ulceration, or beading. Vertebral arteries: No proximal subclavian stenosis. Mild atheromatous plaque at the vertebral origins. No stenosis or beading. Skeleton: No acute finding. Other neck: No incidental mass or inflammation. Upper chest: Negative Review of the MIP images confirms the above findings CTA HEAD FINDINGS Anterior circulation: No noted atheromatous changes. No branch occlusion or stenosis. Negative for beading or aneurysm. Posterior circulation: Vertebral and basilar arteries are smooth and widely patent. There is a left P2 segment occlusion or pre occlusion with flow gap. Venous sinuses: Patent Anatomic variants: None significant Delayed phase: No abnormal intracranial enhancement. Review of the MIP images confirms the above findings CT Brain Perfusion Findings: CBF (<30%) Volume: 59m Perfusion (Tmax>6.0s) volume: 170mThese results were called by telephone at the time of interpretation on 06/15/2017 at 7:08 pm to Dr. LoReita ClichePatient has already been given tPA and is being transferred. IMPRESSION: 1. Left P2 segment occlusion with downstream reconstitution. No infarct by CTP but there is 13cc of measurable left occipital lobe penumbra. 2. Very mild atheromatous  changes for age. No stenosis or noted embolic souce. Electronically Signed   By: JoMonte Fantasia.D.   On: 06/15/2017 19:20   Ct Cerebral Perfusion W Contrast  Result Date: 06/15/2017 CLINICAL DATA:  Fall and right arm numbness with aphasia that resolved. Right homonymous hemianopia. EXAM: CT ANGIOGRAPHY HEAD AND NECK CT PERFUSION BRAIN TECHNIQUE: Multidetector CT imaging of the head and neck was performed using the standard protocol during bolus administration of intravenous contrast. Multiplanar CT image reconstructions and MIPs were obtained to evaluate the vascular anatomy. Carotid stenosis measurements (when applicable) are obtained utilizing NASCET criteria, using the distal internal carotid diameter as the denominator. Multiphase CT imaging of the brain was performed following IV bolus contrast injection. Subsequent parametric perfusion maps were calculated using RAPID software. CONTRAST:  10058mSOVUE-370 IOPAMIDOL (ISOVUE-370) INJECTION 76% COMPARISON:  Noncontrast head CT earlier today FINDINGS: CTA NECK FINDINGS Aortic arch: No acute finding.  Three vessel branching. Right carotid system: Mild atherosclerotic plaque at the ICA bulb. No beading, stenosis, or ulceration. Left carotid system: Mild atherosclerotic plaque at the common carotid bifurcation and ICA bulb. No stenosis, ulceration, or beading. Vertebral arteries: No proximal subclavian stenosis. Mild atheromatous plaque at the vertebral origins. No stenosis or beading. Skeleton: No acute finding. Other neck: No incidental mass or inflammation. Upper chest: Negative Review of the MIP images confirms the above findings CTA HEAD FINDINGS Anterior circulation: No noted atheromatous changes. No branch occlusion or stenosis. Negative for beading or aneurysm. Posterior circulation: Vertebral and basilar arteries are smooth and widely patent. There is a left P2 segment occlusion or pre occlusion with flow gap. Venous sinuses: Patent Anatomic variants:  None significant Delayed phase: No abnormal intracranial enhancement. Review of the MIP images confirms the above findings CT Brain Perfusion Findings: CBF (<30%) Volume: 0mL35mrfusion (Tmax>6.0s) volume: 13mL9mse results were called by telephone at the time of interpretation on 06/15/2017 at 7:08 pm to Dr. Lord.Reita Clicheient has already been given tPA and is being transferred. IMPRESSION: 1. Left P2 segment occlusion with downstream reconstitution. No infarct by CTP but there is 13cc of measurable left occipital lobe penumbra. 2. Very mild atheromatous changes for age. No stenosis or noted embolic souce. Electronically Signed  By: Monte Fantasia M.D.   On: 06/15/2017 19:20    Assessment: 82 y.o. female with acute left PCA ischemic infarction 1. Status post IV tPA.  2. Not an endovascular candidate as occlusion is distal PCA and not accessible with catheter 3. Exam shows neurological deficits referable to the left occipital, parietal and temporal lobes, but per family and patient she has improved partially since tPA was administered 4. Stroke Risk Factors - None  Plan: 1. Admit to ICU under Neurology service 2. Post-tPA orders to include BP management and frequent neuro checks 3. MRI of the brain without contrast 3. PT consult, OT consult, Speech consult 4. TTE 5. No antiplatelet medications or anticoagulants for at least 24 hours following tPA. DVT prophylaxis with SCDs 6. Repeat CT in 24 hours. If negative for hemorrhagic conversion, can start ASA 7. Lipitor 40 mg po qd 8. Telemetry monitoring 9. HgbA1c, fasting lipid panel  45 minutes spent in the emergent Neurological evaluation and management of this acute stroke patient  Electronically signed: Dr. Kerney Elbe 06/15/2017, 9:55 PM

## 2017-06-15 NOTE — ED Notes (Signed)
Pt taken to MRI  

## 2017-06-15 NOTE — ED Notes (Signed)
Dr. Lindzen at bedside. 

## 2017-06-15 NOTE — ED Notes (Addendum)
Spoke with Dr. Otelia LimesLindzen about pt arrival in ED.

## 2017-06-15 NOTE — ED Notes (Signed)
Pt transported to [facility] stretcher in hallway. Pts family updated as best as possible. No further questions at this time.

## 2017-06-15 NOTE — Consult Note (Addendum)
[facility] TeleNeurology Consult Services Date of Service: 06/15/2017  Impression:  Patient with a fall and then right arm numbness and aphasia that resolved, but she has a clear right homonymous hemianopia.  Given the cortical findings, I am suspicious for possible LVO of the left MCA or PCA. Given persistent neurologic deficits, I reviewed the risks/benefits/alternatives to tPA as noted below.  The patient gave verbal consent to tPA.  CTA/CTP was done after tPA - 13 ml left occipital penumbra, left P2 occlusion but with distal reconstitution - she would not be a NIR candidate.  Differential Diagnosis:  1. Cardioembolic stroke  2. Small vessel disease/lacune  3. Thromboembolic, artery-to-artery mechanism  4. Hypercoagulable state-related infarct  5. Transient ischemic attack  6. Thrombotic mechanism, large artery disease   Comments:   Door time:  1644 [facility] contacted: 1735 [facility] at bedside: 1740 NIHSS assessment time: 1745 Verbal tpa order: 1751 Pre-tPA BP: 179/88 Finger stick glucose: 115 Needle time: 1812  Verbal Consent to tPA:  I have explained to the patient/family/guardian the nature of the patient's condition, the use of tPA fibrinolytic agent, and the benefits to be reasonably expected compared with alternative approaches. I have discussed the likelihood of major risks or complications of this procedure including (if applicable) but not limited to loss of limb function, brain damage, paralysis, hemorrhage, infection, complications from transfusion of blood components, drug reactions, blood clots and loss of life. I have also indicated that with any procedure there is always the possibility of an unexpected complication. I have explained the risks which include:    1. Death, Stroke or permanent neurologic injury (paralysis, coma, etc)   2. Worsening of stroke symptoms from swelling or bleeding in the brain   3. Bleeding in other parts of the body     4. Need for blood transfusions to replace blood or clotting factors   5. Allergic reaction to medications   6. Other unexpected complications    All questions were answered and the patient/family/guardian express understanding of the treatment plan and consent to the procedure.  Our recommendations are outlined below.   We will be seeing the patient back in follow up as noted.    Recommendations:   IV tPA - dose = 5.7mg  bolus, 51.3mg  infusion Routine post tPA monitoring including neuro checks and blood pressure control during/after treatment Monitor blood pressure Check blood pressure and NIHSS every 15 min for 2 h, then every 30 min for 6 h, and finally every hour for 16 h  Systolic greater than 180 OR diastolic greater than 105: Option 1: Labetalol 10 mg IV for 1 - 2 min May repeat or double labetalol every 10 min to maximum dose of 300 mg, or give initial labetalol dose, then start labetalol drip at 2 - 8 mg/min. Option 2: Nicardipine 5 mg/h IV infusion as initial dose and titrate to desired effect by increasing 2.5 mg/h every 5 min to maximum of 15 mg/h;  If blood pressure is not controlled by labetolol or nicardipine, consider sodium nitroprusside.  Admission to ICU CT brain 24 hours post tPA NPO until swallowing screen performed and passed No antiplatelet agents or anticoagulants (including heparin for DVT prophylaxis) in first 24 hours No Foley catheter, nasogastric tube, arterial catheter or central venous catheter for 24 hr, unless absolutely necessary Telemetry  Inpatient Neurology Consultation Stroke evaluation as per inpatient neurology recommendations Discussed with ED MD   ------------------------------------------------------------------------------  CC: stroke alert  History of Present Illness  Patient is a  82 year old woman with no medical history who presented to the ED after a fall.  Last seen normal by family at [id number] and a few minutes later they found the  patient on the ground.  She was unable to get up. She was complaining of right arm numbness.  On arrival to the ED she was not speaking clearly with aphasia.  Symptoms have improved, but she still seems confused when trying to read the stroke cards.  Diagnostic CT head wo - nothing acute  Exam  NIHSS score: 2 1a LOC: 0  1b Questions: 0  1c Commands: 0  2 Gaze: 0  3 VF: 2 4 Face: 0  5a Motor arm left: 0 5b Motor arm right: 0 6a Motor leg left: 0 6b Motor leg right: 0  7 Ataxia: 0  8 Sensory: 0  9: Language: 0  10: Speech: 0  11: Extinction: 0    Medical Decision Making:  - Extensive number of diagnosis or management options are considered above.   - Extensive amount of complex data reviewed.   - High risk of complication and/or morbidity or mortality are associated with differential diagnostic considerations above.  - There may be Uncertain outcome and increased probability of prolonged functional impairment or high probability of severe prolonged functional impairment associated with some of these differential diagnosis.  Medical Data Reviewed:  1.Data reviewed include clinical labs, radiology,  Medical Tests;   2.Tests results discussed w/performing or interpreting physician;   3.Obtaining/reviewing old medical records;  4.Obtaining case history from another source;  5.Independent review of image, tracing or specimen.   Medical Decision Making:  - Extensive number of diagnosis or management options are considered above.   - Extensive amount of complex data reviewed.   - High risk of complication and/or morbidity or mortality are associated with differential diagnostic considerations above.  - There may be Uncertain outcome and increased probability of prolonged functional impairment or high probability of severe prolonged functional impairment associated with some of these differential diagnosis.  Medical Data Reviewed:  1.Data reviewed include clinical labs, radiology,   Medical Tests;   2.Tests results discussed w/performing or interpreting physician;   3.Obtaining/reviewing old medical records;  4.Obtaining case history from another source;  5.Independent review of image, tracing or specimen.    Patient was informed the Neurology Consult would happen via telehealth (remote video) and consented to receiving care in this manner.

## 2017-06-15 NOTE — ED Triage Notes (Signed)
Pts family was at pt house at 345, and pt was seen falling to the ground. She was on her hands and knees, they did not see her hit her head. She was talking normal when EMS arrived and still on hands and knees. She stated to EMS that she could not feel her right arm but was still able to move it and still speaking clearly. Once pt arrived to the ER she was not speaking clearly. MD in room for evaluation and Code Stroke was called.

## 2017-06-15 NOTE — ED Notes (Signed)
Spoke with Dr. Otelia LimesLindzen about ICU bed request order. Awaiting order.

## 2017-06-15 NOTE — ED Notes (Signed)
Spoke with Dr. Otelia LimesLindzen after paging to ask for bed request order to move pt to ICU s/p TPA

## 2017-06-16 ENCOUNTER — Inpatient Hospital Stay (HOSPITAL_COMMUNITY): Payer: Medicare Other

## 2017-06-16 DIAGNOSIS — I361 Nonrheumatic tricuspid (valve) insufficiency: Secondary | ICD-10-CM

## 2017-06-16 DIAGNOSIS — I63332 Cerebral infarction due to thrombosis of left posterior cerebral artery: Secondary | ICD-10-CM

## 2017-06-16 DIAGNOSIS — E785 Hyperlipidemia, unspecified: Secondary | ICD-10-CM

## 2017-06-16 LAB — CBC
HEMATOCRIT: 35.8 % — AB (ref 36.0–46.0)
Hemoglobin: 11.9 g/dL — ABNORMAL LOW (ref 12.0–15.0)
MCH: 31.4 pg (ref 26.0–34.0)
MCHC: 33.2 g/dL (ref 30.0–36.0)
MCV: 94.5 fL (ref 78.0–100.0)
Platelets: 130 10*3/uL — ABNORMAL LOW (ref 150–400)
RBC: 3.79 MIL/uL — ABNORMAL LOW (ref 3.87–5.11)
RDW: 12.8 % (ref 11.5–15.5)
WBC: 7.9 10*3/uL (ref 4.0–10.5)

## 2017-06-16 LAB — BASIC METABOLIC PANEL
Anion gap: 7 (ref 5–15)
BUN: 10 mg/dL (ref 6–20)
CO2: 23 mmol/L (ref 22–32)
Calcium: 9.1 mg/dL (ref 8.9–10.3)
Chloride: 108 mmol/L (ref 101–111)
Creatinine, Ser: 0.72 mg/dL (ref 0.44–1.00)
GFR calc Af Amer: >60 mL/min (ref >60)
GFR calc non Af Amer: >60 mL/min (ref >60)
GLUCOSE: 110 mg/dL — AB (ref 65–99)
Potassium: 3.9 mmol/L (ref 3.5–5.1)
Sodium: 138 mmol/L (ref 135–145)

## 2017-06-16 LAB — VITAMIN B12: Vitamin B-12: 146 pg/mL — ABNORMAL LOW (ref 180–914)

## 2017-06-16 LAB — GLUCOSE, CAPILLARY: Glucose-Capillary: 115 mg/dL — ABNORMAL HIGH (ref 65–99)

## 2017-06-16 LAB — HEMOGLOBIN A1C
HEMOGLOBIN A1C: 5.2 % (ref 4.8–5.6)
Mean Plasma Glucose: 102.54 mg/dL

## 2017-06-16 LAB — LIPID PANEL
Cholesterol: 213 mg/dL — ABNORMAL HIGH (ref 0–200)
HDL: 80 mg/dL (ref >40)
LDL CALC: 120 mg/dL — AB (ref 0–99)
TRIGLYCERIDES: 65 mg/dL (ref <150)
Total CHOL/HDL Ratio: 2.7 RATIO
VLDL: 13 mg/dL (ref 0–40)

## 2017-06-16 LAB — MRSA PCR SCREENING: MRSA BY PCR: NEGATIVE

## 2017-06-16 LAB — TSH: TSH: 1.132 u[IU]/mL (ref 0.350–4.500)

## 2017-06-16 LAB — ECHOCARDIOGRAM COMPLETE
Height: 63 in
WEIGHTICAEL: 2130.53 [oz_av]

## 2017-06-16 MED ORDER — ATORVASTATIN CALCIUM 20 MG PO TABS
20.0000 mg | ORAL_TABLET | Freq: Every day | ORAL | Status: DC
  Administered 2017-06-16 – 2017-06-19 (×4): 20 mg via ORAL
  Filled 2017-06-16 (×3): qty 1
  Filled 2017-06-16: qty 2

## 2017-06-16 MED ORDER — ASPIRIN EC 325 MG PO TBEC
325.0000 mg | DELAYED_RELEASE_TABLET | Freq: Every day | ORAL | Status: DC
  Administered 2017-06-16 – 2017-06-19 (×4): 325 mg via ORAL
  Filled 2017-06-16 (×4): qty 1

## 2017-06-16 MED ORDER — PANTOPRAZOLE SODIUM 40 MG PO TBEC
40.0000 mg | DELAYED_RELEASE_TABLET | Freq: Every day | ORAL | Status: DC
  Administered 2017-06-16 – 2017-06-20 (×4): 40 mg via ORAL
  Filled 2017-06-16 (×5): qty 1

## 2017-06-16 MED ORDER — LABETALOL HCL 5 MG/ML IV SOLN: 10.0000 mg | INTRAVENOUS | Status: DC | PRN

## 2017-06-16 MED ORDER — ENOXAPARIN SODIUM 40 MG/0.4ML ~~LOC~~ SOLN
40.0000 mg | SUBCUTANEOUS | Status: DC
Start: 2017-06-17 — End: 2017-06-20
  Administered 2017-06-17 – 2017-06-20 (×4): 40 mg via SUBCUTANEOUS
  Filled 2017-06-16 (×5): qty 0.4

## 2017-06-16 NOTE — ED Notes (Signed)
Pt in CT at this time, unable to perform NIHSS and VS

## 2017-06-16 NOTE — Evaluation (Signed)
Physical Therapy Evaluation Patient Details Name: [name] MRN: [id number] DOB: 08-29-1935 Today's Date: 06/16/2017   History of Present Illness  82 yo admitted after episode of dizziness with fall with right weakness and vision loss. Pt with Left PCA infarct s/p tPA. No PMHx  Clinical Impression  Pt pleasant and eager to get up to bathroom on arrival with pt attempting to climb out of bed with all rails up. Pt needs max and frequent cues for safety and mobility as well as sequence for gait and transfers. Pt with decreased sensation RLE, decreased:balance, cognition, transfers, vision and safety who will benefit from acute therapy to maximize mobility, function, gait and safety to decrease burden of care. Family educated for visiting to right to increase pt attention as well as 2 person assist needed for safe mobility.     Follow Up Recommendations CIR;Supervision/Assistance - 24 hour    Equipment Recommendations  Rolling walker with 5" wheels;3in1 (PT)    Recommendations for Other Services Rehab consult     Precautions / Restrictions Precautions Precautions: Fall Precaution Comments: right inattention, right hemianopsia, ataxia      Mobility  Bed Mobility Overal bed mobility: Needs Assistance Bed Mobility: Supine to Sit     Supine to sit: Min guard;HOB elevated     General bed mobility comments: pt impulsive and trying to get OOB with all rails up. Pt with guarding for lines and safety and cues to decrease speed for awareness of lines  Transfers Overall transfer level: Needs assistance   Transfers: Sit to/from Stand Sit to Stand: Min assist         General transfer comment: min assist to stand from bed and toilet with cues for hand placement and safety  Ambulation/Gait Ambulation/Gait assistance: Mod assist;+2 safety/equipment Ambulation Distance (Feet): 30 Feet Assistive device: 2 person hand held assist Gait Pattern/deviations: Step-through pattern;Decreased  stride length;Leaning posteriorly;Narrow base of support   Gait velocity interpretation: Below normal speed for age/gender General Gait Details: pt with narrow BOS with right posterior lean with very short shuffling steps, not attending to right side with trunk rotated and partially side stepping to ambulate with cues for visual target, hand placement, sequence and safety  Stairs            Wheelchair Mobility    Modified Rankin (Stroke Patients Only) Modified Rankin (Stroke Patients Only) Pre-Morbid Rankin Score: No symptoms Modified Rankin: Moderately severe disability     Balance Overall balance assessment: Needs assistance   Sitting balance-Leahy Scale: Fair       Standing balance-Leahy Scale: Zero Standing balance comment: pt with bil UE support, maintained posterior right lean with lack of awareness for midline                             Pertinent Vitals/Pain Pain Assessment: No/denies pain    Home Living Family/patient expects to be discharged to:: Private residence Living Arrangements: Alone Available Help at Discharge: Family;Available 24 hours/day Type of Home: House       Home Layout: Two level;Laundry or work area in basement;Able to live on main level with [employer]: None      Prior Function Level of Independence: Independent               Hand Dominance        Extremity/Trunk Assessment   Upper Extremity Assessment Upper Extremity Assessment: Defer to OT evaluation    Lower Extremity Assessment  Lower Extremity Assessment: RLE deficits/detail RLE Deficits / Details: pt with functional strength, not formally assessed with decreaesed proprioception and awareness of position RLE Sensation: decreased proprioception    Cervical / Trunk Assessment Cervical / Trunk Assessment: Normal  Communication   Communication: No difficulties  Cognition Arousal/Alertness: Awake/alert Behavior During Therapy:  Impulsive Overall Cognitive Status: Impaired/Different from baseline Area of Impairment: Orientation;Attention;Memory;Following commands;Safety/judgement;Problem solving                 Orientation Level: Disoriented to;Time Current Attention Level: Sustained Memory: Decreased short-term memory Following Commands: Follows one step commands inconsistently;Follows one step commands with increased time Safety/Judgement: Decreased awareness of safety;Decreased awareness of deficits   Problem Solving: Requires tactile cues;Requires verbal cues;Decreased initiation        General Comments      Exercises     Assessment/Plan    PT Assessment Patient needs continued PT services  PT Problem List Decreased strength;Decreased mobility;Decreased safety awareness;Decreased coordination;Decreased activity tolerance;Decreased cognition;Decreased balance;Decreased knowledge of use of DME;Impaired sensation       PT Treatment Interventions Gait training;Therapeutic exercise;Patient/family education;Stair training;Balance training;Functional mobility training;Neuromuscular re-education;DME instruction;Therapeutic activities;Cognitive remediation    PT Goals (Current goals can be found in the Care Plan section)  Acute Rehab PT Goals Patient Stated Goal: return home PT Goal Formulation: With patient/family Time For Goal Achievement: 06/30/17 Potential to Achieve Goals: Fair    Frequency Min 4X/week   Barriers to discharge Decreased caregiver support pt lives alone but among family they believe they can provide 24hr care    Co-evaluation               AM-PAC PT "6 Clicks" Daily Activity  Outcome Measure Difficulty turning over in bed (including adjusting bedclothes, sheets and blankets)?: A Little Difficulty moving from lying on back to sitting on the side of the bed? : A Little Difficulty sitting down on and standing up from a chair with arms (e.g., wheelchair, bedside  commode, etc,.)?: Unable Help needed moving to and from a bed to chair (including a wheelchair)?: A Lot Help needed walking in hospital room?: A Lot Help needed climbing 3-5 steps with a railing? : Total 6 Click Score: 12    End of Session Equipment Utilized During Treatment: Gait belt Activity Tolerance: Patient tolerated treatment well Patient left: in chair;with call bell/phone within reach;with chair alarm set;with family/visitor present Nurse Communication: Mobility status;Precautions PT Visit Diagnosis: Other abnormalities of gait and mobility (R26.89);Other symptoms and signs involving the nervous system (R29.898);Difficulty in walking, not elsewhere classified (R26.2);Unsteadiness on feet (R26.81)    Time: 4098-11911224-1246 PT Time Calculation (min) (ACUTE ONLY): 22 min   Charges:   PT Evaluation $PT Eval Moderate Complexity: 1 Mod     PT G Codes:        Delaney MeigsMaija Tabor Ziair Penson, PT [phone]   Melvern Ramone B Teletha Petrea 06/16/2017, 12:59 PM

## 2017-06-16 NOTE — Evaluation (Signed)
Clinical/Bedside Swallow Evaluation Patient Details  Name: Cecile SheererReva R Sandra MRN: [id number] Date of Birth: 01/09/1936  Today's Date: 06/16/2017 Time: SLP Start Time (ACUTE ONLY): 1150 SLP Stop Time (ACUTE ONLY): 1220 SLP Time Calculation (min) (ACUTE ONLY): 30 min  Past Medical History: No past medical history on file. Past Surgical History: No past surgical history on file. HPI:  Rushie ChestnutReva R Terrellis an 82 y.o.femalewho was in her USOH on Thursday when she suddenly felt dizzy and had a "controlled fall" at 1545. She was unable to get up by herself and family noted her to be weak on the right. She was complaining ofright arm numbness and dizziness. [facility] On arrival,she had garbled speech and right peripheral vision loss.CT head was negative for ICH. She was diagnosed with an acute stroke and tPA infusion started.MRI shows Moderate sized acute ischemic nonhemorrhagic left PCA territory infarct involving the parasagittal left temporal occipital region.    Assessment / Plan / Recommendation Clinical Impression  Pt demosntrates no immediate signs of aspiration. Pts vocal quality slightly hoarse/wet at baseline, but did not change with PO or ever improve with throat clear and does not appear indicative of silent aspiration as pt does not seem to have any subjective oral or oropharyngeal neuromuscular impairment from this CVA. Will initiate a regular diet and thin liquids. SLP will f/u x1 for tolerance.       Aspiration Risk  Mild aspiration risk    Diet Recommendation Regular;Thin liquid   Liquid Administration via: Cup;Straw Medication Administration: Whole meds with liquid Supervision: Patient able to self feed Postural Changes: Seated upright at 90 degrees    Other  Recommendations Oral Care Recommendations: Oral care BID   Follow up Recommendations Inpatient Rehab      Frequency and Duration min 2x/week  2 weeks       Prognosis        Swallow  Study   General HPI: Rushie ChestnutReva R Terrellis an 82 y.o.femalewho was in her USOH on Thursday when she suddenly felt dizzy and had a "controlled fall" at 1545. She was unable to get up by herself and family noted her to be weak on the right. She was complaining ofright arm numbness and dizziness. [facility] On arrival,she had garbled speech and right peripheral vision loss.CT head was negative for ICH. She was diagnosed with an acute stroke and tPA infusion started.MRI shows Moderate sized acute ischemic nonhemorrhagic left PCA territory infarct involving the parasagittal left temporal occipital region.  Type of Study: Bedside Swallow Evaluation Previous Swallow Assessment: none Diet Prior to this Study: NPO Temperature Spikes Noted: No Respiratory Status: Room air History of Recent Intubation: No Behavior/Cognition: Alert;Cooperative;Pleasant mood Oral Cavity Assessment: Within Functional Limits Oral Care Completed by SLP: No Oral Cavity - Dentition: Adequate natural dentition Vision: Functional for self-feeding Self-Feeding Abilities: Able to feed self Patient Positioning: Upright in bed Baseline Vocal Quality: Wet;Hoarse Volitional Cough: Strong Volitional Swallow: Able to elicit    Oral/Motor/Sensory Function Overall Oral Motor/Sensory Function: Within functional limits   Ice Chips Ice chips: Not tested   Thin Liquid Thin Liquid: Within functional limits Presentation: Cup;Straw;Self Fed    Nectar Thick Nectar Thick Liquid: Not tested   Honey Thick Honey Thick Liquid: Not tested   Puree Puree: Within functional limits   Solid   GO   Solid: Within functional limits       [facility] Health CenterBonnie Deloris Moger, MA CCC-SLP [phone]  Claudine MoutonDeBlois, Apryle Stowell Caroline 06/16/2017,3:17 PM

## 2017-06-16 NOTE — ED Notes (Signed)
Pt in CT at this time, unable to perform NIHSS and VS 

## 2017-06-16 NOTE — ED Notes (Signed)
Upon Leaving CT, [facility] in hallway to transport pt to [facility].

## 2017-06-16 NOTE — ED Notes (Signed)
Pt in MRI unable to complete TPA assessment

## 2017-06-16 NOTE — Evaluation (Signed)
Occupational Therapy Evaluation Patient Details Name: [name] MRN: [id number] DOB: 10/11/1935 Today's Date: 06/16/2017    History of Present Illness 82 yo admitted after episode of dizziness with fall with right weakness and vision loss. Pt with Left PCA infarct s/p tPA. No PMHx   Clinical Impression   Pt reports she was independent with ADL PTA. Currently pt overall mod assist +2 for functional mobility and mod assist overall for ADL with cues for sequencing and safety. Pt presenting with impaired balance, R sided weakness/impaired coordination, R inattention and visual field deficits, impaired cognition impacting her independence and safety with ADL and functional mobility. Recommending CIR level therapies to maximize independence and safety with ADL and functional mobility prior to return home. Pt would benefit from continued skilled OT to address established goals.    Follow Up Recommendations  CIR;Supervision/Assistance - 24 hour    Equipment Recommendations  Other (comment)(TBD at next venue)    Recommendations for Other Services       Precautions / Restrictions Precautions Precautions: Fall Precaution Comments: right inattention, right hemianopsia, ataxia Restrictions Weight Bearing Restrictions: No      Mobility Bed Mobility      General bed mobility comments: Pt OOB in bathroom with PT upon arrival.  Transfers Overall transfer level: Needs assistance Equipment used: 1 person hand held assist Transfers: Sit to/from Stand Sit to Stand: Min assist         General transfer comment: min assist to stand from bed and toilet with cues for hand placement and safety    Balance Overall balance assessment: Needs assistance Sitting-balance support: Feet supported Sitting balance-Leahy Scale: Fair     Standing balance support: Bilateral upper extremity supported Standing balance-Leahy Scale: Zero Standing balance comment: pt with bil UE support, maintained  posterior right lean with lack of awareness for midline                           ADL either performed or assessed with clinical judgement   ADL Overall ADL's : Needs assistance/impaired     Grooming: Moderate assistance;Sitting;Oral care   Upper Body Bathing: Sitting;Moderate assistance   Lower Body Bathing: Moderate assistance;Sit to/from stand   Upper Body Dressing : Moderate assistance;Sitting   Lower Body Dressing: Moderate assistance;Sit to/from stand   Toilet Transfer: Moderate assistance;+2 for physical assistance;Ambulation;Comfort height toilet;Grab bars   Toileting- Clothing Manipulation and Hygiene: Minimal assistance;Sit to/from stand       Functional mobility during ADLs: Moderate assistance;+2 for physical assistance       Vision Baseline Vision/History: Wears glasses Wears Glasses: Reading only Patient Visual Report: No change from baseline Vision Assessment?: Vision impaired- to be further tested in functional context Additional Comments: R inattention, pt overshooting during functional tasks     Perception     Praxis      Pertinent Vitals/Pain Pain Assessment: No/denies pain     Hand Dominance Right   Extremity/Trunk Assessment Upper Extremity Assessment Upper Extremity Assessment: RUE deficits/detail RUE Deficits / Details: Grossly 3/5, impaired coordination during functional tasks, poor awareness of position in space RUE Sensation: decreased light touch;decreased proprioception RUE Coordination: decreased fine motor;decreased gross motor   Lower Extremity Assessment Lower Extremity Assessment: Defer to PT evaluation    Cervical / Trunk Assessment Cervical / Trunk Assessment: Normal   Communication Communication Communication: No difficulties   Cognition Arousal/Alertness: Awake/alert Behavior During Therapy: Impulsive Overall Cognitive Status: Impaired/Different from baseline Area of Impairment:  Orientation;Attention;Memory;Following  commands;Safety/judgement;Problem solving                 Orientation Level: Disoriented to;Time Current Attention Level: Sustained Memory: Decreased short-term memory Following Commands: Follows one step commands inconsistently;Follows one step commands with increased time Safety/Judgement: Decreased awareness of safety;Decreased awareness of deficits   Problem Solving: Requires tactile cues;Requires verbal cues;Decreased initiation     General Comments       Exercises     Shoulder Instructions      Home Living Family/patient expects to be discharged to:: Private residence Living Arrangements: Alone Available Help at Discharge: Family;Available 24 hours/day Type of Home: House       Home Layout: Two level;Laundry or work area in basement;Able to live on main level with bedroom/bathroom     Bathroom Shower/Tub: Producer, television/film/video: Standard     Home Equipment: None          Prior Functioning/Environment Level of Independence: Independent                 OT Problem List: Decreased strength;Impaired balance (sitting and/or standing);Decreased activity tolerance;Impaired vision/perception;Decreased coordination;Decreased safety awareness;Decreased cognition;Decreased knowledge of use of DME or AE;Impaired sensation;Impaired UE functional use      OT Treatment/Interventions: Self-care/ADL training;Therapeutic exercise;Neuromuscular education;Energy conservation;DME and/or AE instruction;Therapeutic activities;Cognitive remediation/compensation;Visual/perceptual remediation/compensation;Patient/family education;Balance training    OT Goals(Current goals can be found in the care plan section) Acute Rehab OT Goals Patient Stated Goal: return home OT Goal Formulation: With patient Time For Goal Achievement: 06/30/17 Potential to Achieve Goals: Good ADL Goals Pt Will Perform Grooming: with min guard  assist;standing Pt Will Transfer to Toilet: with min assist;ambulating;regular height toilet Pt Will Perform Toileting - Clothing Manipulation and hygiene: with min guard assist;sit to/from stand Additional ADL Goal #1: Pt will demonstrate selective attention during ADL in a minimally distracting environment. Additional ADL Goal #2: Pt will identify 3/5 items in R visual field with min cues.  OT Frequency: Min 3X/week   Barriers to D/C:            Co-evaluation PT/OT/SLP Co-Evaluation/Treatment: Yes Reason for Co-Treatment: Complexity of the patient's impairments (multi-system involvement);Necessary to address cognition/behavior during functional activity;For patient/therapist safety;To address functional/ADL transfers   OT goals addressed during session: ADL's and self-care      AM-PAC PT "6 Clicks" Daily Activity     Outcome Measure Help from another person eating meals?: A Little Help from another person taking care of personal grooming?: A Little Help from another person toileting, which includes using toliet, bedpan, or urinal?: A Lot Help from another person bathing (including washing, rinsing, drying)?: A Lot Help from another person to put on and taking off regular upper body clothing?: A Lot Help from another person to put on and taking off regular lower body clothing?: A Lot 6 Click Score: 14   End of Session Equipment Utilized During Treatment: Gait belt  Activity Tolerance: Patient tolerated treatment well Patient left: in chair;with call bell/phone within reach;with chair alarm set;with family/visitor present  OT Visit Diagnosis: Unsteadiness on feet (R26.81);Other abnormalities of gait and mobility (R26.89);Hemiplegia and hemiparesis Hemiplegia - Right/Left: Right Hemiplegia - dominant/non-dominant: Dominant Hemiplegia - caused by: Cerebral infarction                Time: 4098-1191 OT Time Calculation (min): 18 min Charges:  OT General Charges $OT Visit: 1  Visit OT Evaluation $OT Eval Moderate Complexity: 1 Mod G-Codes:     Chuong Casebeer A. Brett Albino, M.S., OTR/L  Pager: [phone]  Gaye Alken 06/16/2017, 1:32 PM

## 2017-06-16 NOTE — Progress Notes (Signed)
I will follow up with pt on Monday to discuss rehab venue goals and expectations. CIR admit pending bed availability when pt medically ready for d/c. [phone]

## 2017-06-16 NOTE — Evaluation (Signed)
Speech Language Pathology Evaluation Patient Details Name: [name] MRN: [id number] DOB: 07/04/1935 Today's Date: 06/16/2017 Time: 4098-11911150-1220 SLP Time Calculation (min) (ACUTE ONLY): 30 min  Problem List:  Patient Active Problem List   Diagnosis Date Noted  . Stroke (cerebrum) (HCC) 06/15/2017   Past Medical History: No past medical history on file. Past Surgical History: No past surgical history on file. HPI:  Angela ChestnutReva R Terrellis an 82 y.o.femalewho was in her USOH on Thursday when she suddenly felt dizzy and had a "controlled fall" at 1545. She was unable to get up by herself and family noted her to be weak on the right. She was complaining ofright arm numbness and dizziness. [facility] On arrival,she had garbled speech and right peripheral vision loss.CT head was negative for ICH. She was diagnosed with an acute stroke and tPA infusion started.MRI shows Moderate sized acute ischemic nonhemorrhagic left PCA territory infarct involving the parasagittal left temporal occipital region.    Assessment / Plan / Recommendation Clinical Impression  Pt demonstrates mild aphasia with high level receptive deficits as well as impaired word finding with naming and in conversation impacting conversation level communication. Phonemic and semantic cues beneficial. Significant right visual field cut impacts reading and awareness of functional tasks on right. Pt is able to redirect gaze to right with min question cues. Recommend f/u with CIR for intesive SLP interventions. Will follow acutely for functional communication.     SLP Assessment  SLP Recommendation/Assessment: Patient needs continued Speech Lanaguage Pathology Services SLP Visit Diagnosis: Aphasia (R47.01)    Follow Up Recommendations  Inpatient Rehab    Frequency and Duration min 2x/week  2 weeks      SLP Evaluation Cognition  Overall Cognitive Status: Impaired/Different from baseline Arousal/Alertness:  Awake/alert Orientation Level: Oriented to person;Disoriented to place;Disoriented to time;Disoriented to situation Attention: Focused;Sustained Focused Attention: Appears intact Sustained Attention: Appears intact Memory: Impaired Memory Impairment: Decreased recall of new information Awareness: Impaired Awareness Impairment: Intellectual impairment;Emergent impairment;Anticipatory impairment Problem Solving: Impaired Problem Solving Impairment: Verbal basic;Functional basic Behaviors: Restless;Impulsive Safety/Judgment: Impaired       Comprehension  Auditory Comprehension Overall Auditory Comprehension: Impaired Yes/No Questions: Within Functional Limits Commands: Impaired One Step Basic Commands: 75-100% accurate Two Step Basic Commands: 50-74% accurate Multistep Basic Commands: 50-74% accurate Complex Commands: 0-24% accurate Interfering Components: Visual impairments Reading Comprehension Reading Status: Impaired Word level: Impaired Sentence Level: Not tested Paragraph Level: Not tested Interfering Components: Visual perceptual    Expression Verbal Expression Overall Verbal Expression: Impaired Initiation: No impairment Automatic Speech: Name;Social Response Level of Generative/Spontaneous Verbalization: Conversation Repetition: No impairment Naming: Impairment Responsive: 76-100% accurate Confrontation: Impaired Convergent: 0-24% accurate Divergent: 75-100% accurate Pragmatics: No impairment Written Expression Dominant Hand: Right Written Expression: Not tested   Oral / Motor  Oral Motor/Sensory Function Overall Oral Motor/Sensory Function: Within functional limits Motor Speech Overall Motor Speech: Appears within functional limits for tasks assessed Respiration: Within functional limits Phonation: Wet Resonance: Within functional limits Articulation: Within functional limitis Intelligibility: Intelligible Motor Planning: Witnin functional limits    GO                   [employer] CorporationBonnie Horacio Werth, MA CCC-SLP [phone]  [name], [name] 06/16/2017, 3:26 PM

## 2017-06-16 NOTE — Progress Notes (Signed)
Tried to call report, RN not available at this time.

## 2017-06-16 NOTE — Consult Note (Signed)
Physical Medicine and Rehabilitation Consult Reason for Consult: Right side weakness Referring Physician: Dr.Xu   HPI: [name] is a 82 y.o. right handed female with unremarkable past medical history on no prescription medications. Per chart review patient lives alone independent prior to admission. Two-level home with bedroom and bathroom on first floor.Family live in the area questioned assistance on discharge. Presented 06/15/2017 was sudden onset of dizziness with right-sided weakness, slurred speech blurred vision and fall. Cranial CT scan negative. Patient did receive TPA. CT cerebral perfusion scan as well as CT angiogram head and neck showed left P2 segment occlusion. 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 Echocardiogram with ejection fraction of 70% no wall motion abnormalities. Neurology follow-up await plan for TEE loop recorder and workup presently ongoing. Physical and occupational therapy evaluation completed 06/16/2017 with recommendations of physical medicine rehabilitation consult.   Review of Systems  Constitutional: Negative for chills and fever.  HENT: Negative for hearing loss.   Eyes: Positive for blurred vision. Negative for double vision.  Respiratory: Negative for cough and shortness of breath.   Cardiovascular: Negative for chest pain, palpitations and leg swelling.  Gastrointestinal: Positive for constipation. Negative for nausea.  Genitourinary: Negative for dysuria, flank pain and hematuria.  Musculoskeletal: Positive for myalgias.  Skin: Negative for rash.  Neurological: Positive for dizziness, sensory change, speech change and focal weakness.  All other systems reviewed and are negative.  No past medical history on file. No past surgical history on file. No family history on file. Social History:  reports that  has never smoked. She does not have any smokeless tobacco history on file. She reports that she  does not drink alcohol or use drugs. Allergies: No Known Allergies No medications prior to admission.    Home: Home Living Family/patient expects to be discharged to:: Private residence Living Arrangements: Alone Available Help at Discharge: Family, Available 24 hours/day Type of Home: House Home Layout: Two level, Laundry or work area in basement, Able to live on main level with bedroom/bathroom Bathroom Shower/Tub: Health visitorWalk-in shower Bathroom Toilet: Standard Home Equipment: None  Functional History: Prior Function Level of Independence: Independent Functional Status:  Mobility: Bed Mobility Overal bed mobility: Needs Assistance Bed Mobility: Supine to Sit Supine to sit: Min guard, HOB elevated General bed mobility comments: Pt OOB in bathroom with PT upon arrival. Transfers Overall transfer level: Needs assistance Equipment used: 1 person hand held assist Transfers: Sit to/from Stand Sit to Stand: Min assist General transfer comment: min assist to stand from bed and toilet with cues for hand placement and safety Ambulation/Gait Ambulation/Gait assistance: Mod assist, +2 safety/equipment Ambulation Distance (Feet): 30 Feet Assistive device: 2 person hand held assist Gait Pattern/deviations: Step-through pattern, Decreased stride length, Leaning posteriorly, Narrow base of support General Gait Details: pt with narrow BOS with right posterior lean with very short shuffling steps, not attending to right side with trunk rotated and partially side stepping to ambulate with cues for visual target, hand placement, sequence and safety Gait velocity interpretation: Below normal speed for age/gender    ADL: ADL Overall ADL's : Needs assistance/impaired Grooming: Moderate assistance, Sitting, Oral care Upper Body Bathing: Sitting, Moderate assistance Lower Body Bathing: Moderate assistance, Sit to/from stand Upper Body Dressing : Moderate assistance, Sitting Lower Body Dressing:  Moderate assistance, Sit to/from stand Toilet Transfer: Moderate assistance, +2 for physical assistance, Ambulation, Comfort height toilet, Grab bars Toileting- Clothing Manipulation and Hygiene: Minimal assistance, Sit to/from stand Functional  mobility during ADLs: Moderate assistance, +2 for physical assistance  Cognition: Cognition Overall Cognitive Status: Impaired/Different from baseline Cognition Arousal/Alertness: Awake/alert Behavior During Therapy: Impulsive Overall Cognitive Status: Impaired/Different from baseline Area of Impairment: Orientation, Attention, Memory, Following commands, Safety/judgement, Problem solving Orientation Level: Disoriented to, Time Current Attention Level: Sustained Memory: Decreased short-term memory Following Commands: Follows one step commands inconsistently, Follows one step commands with increased time Safety/Judgement: Decreased awareness of safety, Decreased awareness of deficits Problem Solving: Requires tactile cues, Requires verbal cues, Decreased initiation  Blood pressure (!) 151/96, pulse 80, temperature 97.9 F (36.6 C), temperature source Oral, resp. rate (!) 21, height 5\' 3"  (1.6 m), weight 60.4 kg (133 lb 2.5 oz), SpO2 100 %. Physical Exam  Vitals reviewed. Constitutional: She appears well-developed.  Eyes:  Pupils reactive to light  Neck: Normal range of motion. Neck supple. No thyromegaly present.  Cardiovascular: Normal rate, regular rhythm and normal heart sounds.  Respiratory: Effort normal and breath sounds normal. No respiratory distress.  GI: Soft. Bowel sounds are normal. She exhibits no distension.  Neurological:  Mood is flat but appropriate. Follows simple commands. Provides her name and age. Right HH. Right pronator drift and decreased FTN, HTN right upper and lower exts. Senses pain in all 4's.   Skin: Skin is warm and dry.    Results for orders placed or performed during the hospital encounter of 06/15/17 (from  the past 24 hour(s))  MRSA PCR Screening     Status: None   Collection Time: 06/16/17  1:35 AM  Result Value Ref Range   MRSA by PCR NEGATIVE NEGATIVE  Hemoglobin A1c     Status: None   Collection Time: 06/16/17  4:40 AM  Result Value Ref Range   Hgb A1c MFr Bld 5.2 4.8 - 5.6 %   Mean Plasma Glucose 102.54 mg/dL  Lipid panel     Status: Abnormal   Collection Time: 06/16/17  4:40 AM  Result Value Ref Range   Cholesterol 213 (H) 0 - 200 mg/dL   Triglycerides 65 <409 mg/dL   HDL 80 >81 mg/dL   Total CHOL/HDL Ratio 2.7 RATIO   VLDL 13 0 - 40 mg/dL   LDL Cholesterol 191 (H) 0 - 99 mg/dL  TSH     Status: None   Collection Time: 06/16/17  8:02 AM  Result Value Ref Range   TSH 1.132 0.350 - 4.500 uIU/mL  Vitamin B12     Status: Abnormal   Collection Time: 06/16/17  8:02 AM  Result Value Ref Range   Vitamin B-12 146 (L) 180 - 914 pg/mL  CBC     Status: Abnormal   Collection Time: 06/16/17  8:02 AM  Result Value Ref Range   WBC 7.9 4.0 - 10.5 K/uL   RBC 3.79 (L) 3.87 - 5.11 MIL/uL   Hemoglobin 11.9 (L) 12.0 - 15.0 g/dL   HCT 47.8 (L) 29.5 - 62.1 %   MCV 94.5 78.0 - 100.0 fL   MCH 31.4 26.0 - 34.0 pg   MCHC 33.2 30.0 - 36.0 g/dL   RDW 30.8 65.7 - 84.6 %   Platelets 130 (L) 150 - 400 K/uL  Basic metabolic panel     Status: Abnormal   Collection Time: 06/16/17  8:02 AM  Result Value Ref Range   Sodium 138 135 - 145 mmol/L   Potassium 3.9 3.5 - 5.1 mmol/L   Chloride 108 101 - 111 mmol/L   CO2 23 22 - 32 mmol/L   Glucose, Bld  110 (H) 65 - 99 mg/dL   BUN 10 6 - 20 mg/dL   Creatinine, Ser 4.09 0.44 - 1.00 mg/dL   Calcium 9.1 8.9 - 81.1 mg/dL   GFR calc non Af Amer >60 >60 mL/min   GFR calc Af Amer >60 >60 mL/min   Anion gap 7 5 - 15   Ct Angio Head W Or Wo Contrast  Result Date: 06/15/2017 CLINICAL DATA:  Fall and right arm numbness with aphasia that resolved. Right homonymous hemianopia. EXAM: CT ANGIOGRAPHY HEAD AND NECK CT PERFUSION BRAIN TECHNIQUE: Multidetector CT imaging  of the head and neck was performed using the standard protocol during bolus administration of intravenous contrast. Multiplanar CT image reconstructions and MIPs were obtained to evaluate the vascular anatomy. Carotid stenosis measurements (when applicable) are obtained utilizing NASCET criteria, using the distal internal carotid diameter as the denominator. Multiphase CT imaging of the brain was performed following IV bolus contrast injection. Subsequent parametric perfusion maps were calculated using RAPID software. CONTRAST:  ISOVUE-370 IOPAMIDOL (ISOVUE-370) INJECTION 76% COMPARISON:  Noncontrast head CT earlier today FINDINGS: CTA NECK FINDINGS Aortic arch: No acute finding.  Three vessel branching. Right carotid system: Mild atherosclerotic plaque at the ICA bulb. No beading, stenosis, or ulceration. Left carotid system: Mild atherosclerotic plaque at the common carotid bifurcation and ICA bulb. No stenosis, ulceration, or beading. Vertebral arteries: No proximal subclavian stenosis. Mild atheromatous plaque at the vertebral origins. No stenosis or beading. Skeleton: No acute finding. Other neck: No incidental mass or inflammation. Upper chest: Negative Review of the MIP images confirms the above findings CTA HEAD FINDINGS Anterior circulation: No noted atheromatous changes. No branch occlusion or stenosis. Negative for beading or aneurysm. Posterior circulation: Vertebral and basilar arteries are smooth and widely patent. There is a left P2 segment occlusion or pre occlusion with flow gap. Venous sinuses: Patent Anatomic variants: None significant Delayed phase: No abnormal intracranial enhancement. Review of the MIP images confirms the above findings CT Brain Perfusion Findings: CBF (<30%) Volume: 0mL Perfusion (Tmax>6.0s) volume: 13mL These results were called by telephone at the time of interpretation on 06/15/2017 at 7:08 pm to Dr. Shaune Pollack. Patient has already been given tPA and is being transferred.  IMPRESSION: 1. Left P2 segment occlusion with downstream reconstitution. No infarct by CTP but there is 13cc of measurable left occipital lobe penumbra. 2. Very mild atheromatous changes for age. No stenosis or noted embolic souce. Electronically Signed   By: Marnee Spring M.D.   On: 06/15/2017 19:20   Ct Head Wo Contrast  Result Date: 06/15/2017 CLINICAL DATA:  Altered level of consciousness.  Fall.  Aphasia. EXAM: CT HEAD WITHOUT CONTRAST TECHNIQUE: Contiguous axial images were obtained from the base of the skull through the vertex without intravenous contrast. COMPARISON:  None. FINDINGS: Brain: There is no evidence of acute infarct, intracranial hemorrhage, mass, midline shift, or extra-axial fluid collection. The ventricles and sulci are normal. Vascular: No hyperdense vessel. Skull: No fracture or suspicious osseous lesion. 5 mm sclerotic focus in the left frontal bone, possibly a bone island. Sinuses/Orbits: Visualized paranasal sinuses and mastoid air cells are clear. Bilateral cataract extraction. Other: None. IMPRESSION: Negative head CT. These results were called by telephone at the time of interpretation on 06/15/2017 at 5:27 pm to Dr. Governor Rooks , who verbally acknowledged these results. Electronically Signed   By: Sebastian Ache M.D.   On: 06/15/2017 17:28   Ct Angio Neck W Or Wo Contrast  Result Date: 06/15/2017 CLINICAL DATA:  Fall and right arm numbness with aphasia that resolved. Right homonymous hemianopia. EXAM: CT ANGIOGRAPHY HEAD AND NECK CT PERFUSION BRAIN TECHNIQUE: Multidetector CT imaging of the head and neck was performed using the standard protocol during bolus administration of intravenous contrast. Multiplanar CT image reconstructions and MIPs were obtained to evaluate the vascular anatomy. Carotid stenosis measurements (when applicable) are obtained utilizing NASCET criteria, using the distal internal carotid diameter as the denominator. Multiphase CT imaging of the brain was  performed following IV bolus contrast injection. Subsequent parametric perfusion maps were calculated using RAPID software. CONTRAST:  ISOVUE-370 IOPAMIDOL (ISOVUE-370) INJECTION 76% COMPARISON:  Noncontrast head CT earlier today FINDINGS: CTA NECK FINDINGS Aortic arch: No acute finding.  Three vessel branching. Right carotid system: Mild atherosclerotic plaque at the ICA bulb. No beading, stenosis, or ulceration. Left carotid system: Mild atherosclerotic plaque at the common carotid bifurcation and ICA bulb. No stenosis, ulceration, or beading. Vertebral arteries: No proximal subclavian stenosis. Mild atheromatous plaque at the vertebral origins. No stenosis or beading. Skeleton: No acute finding. Other neck: No incidental mass or inflammation. Upper chest: Negative Review of the MIP images confirms the above findings CTA HEAD FINDINGS Anterior circulation: No noted atheromatous changes. No branch occlusion or stenosis. Negative for beading or aneurysm. Posterior circulation: Vertebral and basilar arteries are smooth and widely patent. There is a left P2 segment occlusion or pre occlusion with flow gap. Venous sinuses: Patent Anatomic variants: None significant Delayed phase: No abnormal intracranial enhancement. Review of the MIP images confirms the above findings CT Brain Perfusion Findings: CBF (<30%) Volume: 0mL Perfusion (Tmax>6.0s) volume: 13mL These results were called by telephone at the time of interpretation on 06/15/2017 at 7:08 pm to Dr. Shaune Pollack. Patient has already been given tPA and is being transferred. IMPRESSION: 1. Left P2 segment occlusion with downstream reconstitution. No infarct by CTP but there is 13cc of measurable left occipital lobe penumbra. 2. Very mild atheromatous changes for age. No stenosis or noted embolic souce. Electronically Signed   By: Marnee Spring M.D.   On: 06/15/2017 19:20   Mr Brain Wo Contrast  Result Date: 06/16/2017 CLINICAL DATA:  Follow-up examination for  stroke. Right-sided numbness. EXAM: MRI HEAD WITHOUT CONTRAST TECHNIQUE: Multiplanar, multiecho pulse sequences of the brain and surrounding structures were obtained without intravenous contrast. COMPARISON:  Prior CTs from 06/15/2016. FINDINGS: Brain: Study degraded by motion artifact. Cerebral volume normal. Minimal T2/FLAIR hyperintensity within the periventricular white matter, most like related chronic small vessel ischemic disease, felt to be within normal limits for age. Moderate-sized confluent area of restricted diffusion seen involving the parasagittal left temporal occipital region, consistent with acute left PCA territory infarct. Involvement of the left thalamus. No associated mass effect or hemorrhage. No other evidence for acute or subacute ischemia. Gray-white matter differentiation otherwise maintained. No mass lesion, midline shift or mass effect. No hydrocephalus. No extra-axial fluid collection. Pituitary gland suprasellar region normal. Midline structures intact and normal. Vascular: Major intracranial vascular flow voids maintained at the skullbase. Skull and upper cervical spine: Craniocervical junction normal. Upper cervical spine within normal limits. Bone marrow signal intensity normal. No scalp soft tissue abnormality. Sinuses/Orbits: Globes normal soft tissues within normal limits. Patient status post cataract extraction bilaterally. Paranasal sinuses are clear. Trace right mastoid effusion. Inner ear structures normal. Other: None IMPRESSION: 1. Moderate sized acute ischemic nonhemorrhagic left PCA territory infarct. 2. Otherwise normal brain MRI. Electronically Signed   By: Rise Mu M.D.   On: 06/16/2017 00:49   Ct  Cerebral Perfusion W Contrast  Result Date: 06/15/2017 CLINICAL DATA:  Fall and right arm numbness with aphasia that resolved. Right homonymous hemianopia. EXAM: CT ANGIOGRAPHY HEAD AND NECK CT PERFUSION BRAIN TECHNIQUE: Multidetector CT imaging of the head and  neck was performed using the standard protocol during bolus administration of intravenous contrast. Multiplanar CT image reconstructions and MIPs were obtained to evaluate the vascular anatomy. Carotid stenosis measurements (when applicable) are obtained utilizing NASCET criteria, using the distal internal carotid diameter as the denominator. Multiphase CT imaging of the brain was performed following IV bolus contrast injection. Subsequent parametric perfusion maps were calculated using RAPID software. CONTRAST:  ISOVUE-370 IOPAMIDOL (ISOVUE-370) INJECTION 76% COMPARISON:  Noncontrast head CT earlier today FINDINGS: CTA NECK FINDINGS Aortic arch: No acute finding.  Three vessel branching. Right carotid system: Mild atherosclerotic plaque at the ICA bulb. No beading, stenosis, or ulceration. Left carotid system: Mild atherosclerotic plaque at the common carotid bifurcation and ICA bulb. No stenosis, ulceration, or beading. Vertebral arteries: No proximal subclavian stenosis. Mild atheromatous plaque at the vertebral origins. No stenosis or beading. Skeleton: No acute finding. Other neck: No incidental mass or inflammation. Upper chest: Negative Review of the MIP images confirms the above findings CTA HEAD FINDINGS Anterior circulation: No noted atheromatous changes. No branch occlusion or stenosis. Negative for beading or aneurysm. Posterior circulation: Vertebral and basilar arteries are smooth and widely patent. There is a left P2 segment occlusion or pre occlusion with flow gap. Venous sinuses: Patent Anatomic variants: None significant Delayed phase: No abnormal intracranial enhancement. Review of the MIP images confirms the above findings CT Brain Perfusion Findings: CBF (<30%) Volume: 0mL Perfusion (Tmax>6.0s) volume: 13mL These results were called by telephone at the time of interpretation on 06/15/2017 at 7:08 pm to Dr. Shaune Pollack. Patient has already been given tPA and is being transferred. IMPRESSION: 1. Left  P2 segment occlusion with downstream reconstitution. No infarct by CTP but there is 13cc of measurable left occipital lobe penumbra. 2. Very mild atheromatous changes for age. No stenosis or noted embolic souce. Electronically Signed   By: Marnee Spring M.D.   On: 06/15/2017 19:20    Assessment/Plan: Diagnosis: left PCA infarct 1. Does the need for close, 24 hr/day medical supervision in concert with the patient's rehab needs make it unreasonable for this patient to be served in a less intensive setting? Yes 2. 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 Due to bladder management, bowel management, safety, skin/wound care, disease management, medication administration and patient education, does the patient require 24 hr/day rehab nursing? Yes 4. Does the patient require coordinated care of a physician, rehab nurse, PT (1-2 hrs/day, 5 days/week) and OT (1-2 hrs/day, 5 days/week) to address physical and functional deficits in the context of the above medical diagnosis(es)? Yes Addressing deficits in the following areas: balance, endurance, locomotion, strength, transferring, bowel/bladder control, bathing, dressing, feeding, grooming, toileting and psychosocial support 5. Can the patient actively participate in an intensive therapy program of at least 3 hrs of therapy per day at least 5 days per week? Yes 6. The potential for patient to make measurable gains while on inpatient rehab is excellent 7. Anticipated functional outcomes upon discharge from inpatient rehab are modified independent and supervision  with PT, modified independent and supervision with OT, n/a with SLP. 8. Estimated rehab length of stay to reach the above functional goals is: 10-15 days 9. Anticipated D/C setting: Home 10. Anticipated post D/C treatments: HH therapy 11. Overall Rehab/Functional Prognosis: excellent  RECOMMENDATIONS: This patient's condition is appropriate for  continued rehabilitative care in the following setting: CIR Patient has agreed to participate in recommended program. Yes Note that insurance prior authorization may be required for reimbursement for recommended care.  Comment: Rehab Admissions Coordinator to follow up.  Thanks,  Ranelle Oyster, MD, [place] [facility]    Mcarthur Rossetti Angiulli, PA-C 06/16/2017

## 2017-06-16 NOTE — Progress Notes (Signed)
  Echocardiogram 2D Echocardiogram has been performed.  Celene SkeenVijay  Sharron Simpson 06/16/2017, 12:14 PM

## 2017-06-16 NOTE — Progress Notes (Addendum)
    [facility] has been requested to perform a transesophageal echocardiogram on 06/19/16 for Stroke.  After careful review of history and examination, the risks and benefits of transesophageal echocardiogram have been explained including risks of esophageal damage, perforation (1:10,000 risk), bleeding, pharyngeal hematoma as well as other potential complications associated with conscious sedation including aspiration, arrhythmia, respiratory failure and death. Alternatives to treatment were discussed, questions were answered. Patient is willing to proceed. Labs and vitals signs stable.   Laverda PageLindsay Ava Tangney, NP-C 06/16/2017 1:44 PM

## 2017-06-16 NOTE — Progress Notes (Signed)
STROKE TEAM PROGRESS NOTE   SUBJECTIVE (INTERVAL HISTORY) Her daughters and granddaughter are at the bedside.  Overall she feels her condition is gradually improving. She still has right hemianopia, right sensory neglect and mild ataxia on the right. However, she stated that the weakness and numbness are getting better. MRI confirmed left PCA infarct. CT pending to rule out bleeding at 24 h of tPA.    OBJECTIVE Temp:  [98 F (36.7 C)-98.5 F (36.9 C)] 98.3 F (36.8 C) (01/04 0800) Pulse Rate:  [59-81] 74 (01/04 1100) Resp:  [11-25] 17 (01/04 1100) BP: (125-187)/(75-119) 157/119 (01/04 1100) SpO2:  [95 %-100 %] 98 % (01/04 1100) FiO2 (%):  [0 %] 0 % (01/03 2201) Weight:  [133 lb 2.5 oz (60.4 kg)-140 lb (63.5 kg)] 133 lb 2.5 oz (60.4 kg) (01/04 0100)  Recent Labs  Lab 06/15/17 1719  GLUCAP 115*   Recent Labs  Lab 06/15/17 1707 06/16/17 0802  NA 138 138  K 4.1 3.9  CL 105 108  CO2 22 23  GLUCOSE 127* 110*  BUN 18 10  CREATININE 0.76 0.72  CALCIUM 9.3 9.1   Recent Labs  Lab 06/15/17 1707  AST 33  ALT 19  ALKPHOS 76  BILITOT 0.4  PROT 6.7  ALBUMIN 4.1   Recent Labs  Lab 06/15/17 1707 06/16/17 0802  WBC 4.4 7.9  NEUTROABS 1.9  --   HGB 12.8 11.9*  HCT 37.0 35.8*  MCV 94.7 94.5  PLT 145* 130*   Recent Labs  Lab 06/15/17 1707  TROPONINI <0.03   Recent Labs    06/15/17 1707  LABPROT 12.8  INR 0.97   No results for input(s): COLORURINE, LABSPEC, PHURINE, GLUCOSEU, HGBUR, BILIRUBINUR, KETONESUR, PROTEINUR, UROBILINOGEN, NITRITE, LEUKOCYTESUR in the last 72 hours.  Invalid input(s): APPERANCEUR     Component Value Date/Time   CHOL 213 (H) 06/16/2017 0440   TRIG 65 06/16/2017 0440   HDL 80 06/16/2017 0440   CHOLHDL 2.7 06/16/2017 0440   VLDL 13 06/16/2017 0440   LDLCALC 120 (H) 06/16/2017 0440   Lab Results  Component Value Date   HGBA1C 5.2 06/16/2017   No results found for: LABOPIA, COCAINSCRNUR, LABBENZ, AMPHETMU, THCU, LABBARB  Recent Labs   Lab 06/15/17 1707  ETH <10    I have personally reviewed the radiological images below and agree with the radiology interpretations.  Ct Angio Head W Or Wo Contrast  Result Date: 06/15/2017 CLINICAL DATA:  Fall and right arm numbness with aphasia that resolved. Right homonymous hemianopia. EXAM: CT ANGIOGRAPHY HEAD AND NECK CT PERFUSION BRAIN TECHNIQUE: Multidetector CT imaging of the head and neck was performed using the standard protocol during bolus administration of intravenous contrast. Multiplanar CT image reconstructions and MIPs were obtained to evaluate the vascular anatomy. Carotid stenosis measurements (when applicable) are obtained utilizing NASCET criteria, using the distal internal carotid diameter as the denominator. Multiphase CT imaging of the brain was performed following IV bolus contrast injection. Subsequent parametric perfusion maps were calculated using RAPID software. CONTRAST:  ISOVUE-370 IOPAMIDOL (ISOVUE-370) INJECTION 76% COMPARISON:  Noncontrast head CT earlier today FINDINGS: CTA NECK FINDINGS Aortic arch: No acute finding.  Three vessel branching. Right carotid system: Mild atherosclerotic plaque at the ICA bulb. No beading, stenosis, or ulceration. Left carotid system: Mild atherosclerotic plaque at the common carotid bifurcation and ICA bulb. No stenosis, ulceration, or beading. Vertebral arteries: No proximal subclavian stenosis. Mild atheromatous plaque at the vertebral origins. No stenosis or beading. Skeleton: No acute finding. Other  neck: No incidental mass or inflammation. Upper chest: Negative Review of the MIP images confirms the above findings CTA HEAD FINDINGS Anterior circulation: No noted atheromatous changes. No branch occlusion or stenosis. Negative for beading or aneurysm. Posterior circulation: Vertebral and basilar arteries are smooth and widely patent. There is a left P2 segment occlusion or pre occlusion with flow gap. Venous sinuses: Patent  Anatomic variants: None significant Delayed phase: No abnormal intracranial enhancement. Review of the MIP images confirms the above findings CT Brain Perfusion Findings: CBF (<30%) Volume: 0mL Perfusion (Tmax>6.0s) volume: 13mL These results were called by telephone at the time of interpretation on 06/15/2017 at 7:08 pm to Dr. Shaune Pollack. Patient has already been given tPA and is being transferred. IMPRESSION: 1. Left P2 segment occlusion with downstream reconstitution. No infarct by CTP but there is 13cc of measurable left occipital lobe penumbra. 2. Very mild atheromatous changes for age. No stenosis or noted embolic souce. Electronically Signed   By: Marnee Spring M.D.   On: 06/15/2017 19:20   Ct Head Wo Contrast  Result Date: 06/15/2017 CLINICAL DATA:  Altered level of consciousness.  Fall.  Aphasia. EXAM: CT HEAD WITHOUT CONTRAST TECHNIQUE: Contiguous axial images were obtained from the base of the skull through the vertex without intravenous contrast. COMPARISON:  None. FINDINGS: Brain: There is no evidence of acute infarct, intracranial hemorrhage, mass, midline shift, or extra-axial fluid collection. The ventricles and sulci are normal. Vascular: No hyperdense vessel. Skull: No fracture or suspicious osseous lesion. 5 mm sclerotic focus in the left frontal bone, possibly a bone island. Sinuses/Orbits: Visualized paranasal sinuses and mastoid air cells are clear. Bilateral cataract extraction. Other: None. IMPRESSION: Negative head CT. These results were called by telephone at the time of interpretation on 06/15/2017 at 5:27 pm to Dr. Governor Rooks , who verbally acknowledged these results. Electronically Signed   By: Sebastian Ache M.D.   On: 06/15/2017 17:28   Ct Angio Neck W Or Wo Contrast  Result Date: 06/15/2017 CLINICAL DATA:  Fall and right arm numbness with aphasia that resolved. Right homonymous hemianopia. EXAM: CT ANGIOGRAPHY HEAD AND NECK CT PERFUSION BRAIN TECHNIQUE: Multidetector CT imaging of the  head and neck was performed using the standard protocol during bolus administration of intravenous contrast. Multiplanar CT image reconstructions and MIPs were obtained to evaluate the vascular anatomy. Carotid stenosis measurements (when applicable) are obtained utilizing NASCET criteria, using the distal internal carotid diameter as the denominator. Multiphase CT imaging of the brain was performed following IV bolus contrast injection. Subsequent parametric perfusion maps were calculated using RAPID software. CONTRAST:  ISOVUE-370 IOPAMIDOL (ISOVUE-370) INJECTION 76% COMPARISON:  Noncontrast head CT earlier today FINDINGS: CTA NECK FINDINGS Aortic arch: No acute finding.  Three vessel branching. Right carotid system: Mild atherosclerotic plaque at the ICA bulb. No beading, stenosis, or ulceration. Left carotid system: Mild atherosclerotic plaque at the common carotid bifurcation and ICA bulb. No stenosis, ulceration, or beading. Vertebral arteries: No proximal subclavian stenosis. Mild atheromatous plaque at the vertebral origins. No stenosis or beading. Skeleton: No acute finding. Other neck: No incidental mass or inflammation. Upper chest: Negative Review of the MIP images confirms the above findings CTA HEAD FINDINGS Anterior circulation: No noted atheromatous changes. No branch occlusion or stenosis. Negative for beading or aneurysm. Posterior circulation: Vertebral and basilar arteries are smooth and widely patent. There is a left P2 segment occlusion or pre occlusion with flow gap. Venous sinuses: Patent Anatomic variants: None significant Delayed phase: No abnormal intracranial enhancement.  Review of the MIP images confirms the above findings CT Brain Perfusion Findings: CBF (<30%) Volume: 0mL Perfusion (Tmax>6.0s) volume: 13mL These results were called by telephone at the time of interpretation on 06/15/2017 at 7:08 pm to Dr. Shaune Pollack. Patient has already been given tPA and is being transferred.  IMPRESSION: 1. Left P2 segment occlusion with downstream reconstitution. No infarct by CTP but there is 13cc of measurable left occipital lobe penumbra. 2. Very mild atheromatous changes for age. No stenosis or noted embolic souce. Electronically Signed   By: Marnee Spring M.D.   On: 06/15/2017 19:20   Mr Brain Wo Contrast  Result Date: 06/16/2017 CLINICAL DATA:  Follow-up examination for stroke. Right-sided numbness. EXAM: MRI HEAD WITHOUT CONTRAST TECHNIQUE: Multiplanar, multiecho pulse sequences of the brain and surrounding structures were obtained without intravenous contrast. COMPARISON:  Prior CTs from 06/15/2016. FINDINGS: Brain: Study degraded by motion artifact. Cerebral volume normal. Minimal T2/FLAIR hyperintensity within the periventricular white matter, most like related chronic small vessel ischemic disease, felt to be within normal limits for age. Moderate-sized confluent area of restricted diffusion seen involving the parasagittal left temporal occipital region, consistent with acute left PCA territory infarct. Involvement of the left thalamus. No associated mass effect or hemorrhage. No other evidence for acute or subacute ischemia. Gray-white matter differentiation otherwise maintained. No mass lesion, midline shift or mass effect. No hydrocephalus. No extra-axial fluid collection. Pituitary gland suprasellar region normal. Midline structures intact and normal. Vascular: Major intracranial vascular flow voids maintained at the skullbase. Skull and upper cervical spine: Craniocervical junction normal. Upper cervical spine within normal limits. Bone marrow signal intensity normal. No scalp soft tissue abnormality. Sinuses/Orbits: Globes normal soft tissues within normal limits. Patient status post cataract extraction bilaterally. Paranasal sinuses are clear. Trace right mastoid effusion. Inner ear structures normal. Other: None IMPRESSION: 1. Moderate sized acute ischemic nonhemorrhagic left PCA  territory infarct. 2. Otherwise normal brain MRI. Electronically Signed   By: Rise Mu M.D.   On: 06/16/2017 00:49   Ct Cerebral Perfusion W Contrast  Result Date: 06/15/2017 CLINICAL DATA:  Fall and right arm numbness with aphasia that resolved. Right homonymous hemianopia. EXAM: CT ANGIOGRAPHY HEAD AND NECK CT PERFUSION BRAIN TECHNIQUE: Multidetector CT imaging of the head and neck was performed using the standard protocol during bolus administration of intravenous contrast. Multiplanar CT image reconstructions and MIPs were obtained to evaluate the vascular anatomy. Carotid stenosis measurements (when applicable) are obtained utilizing NASCET criteria, using the distal internal carotid diameter as the denominator. Multiphase CT imaging of the brain was performed following IV bolus contrast injection. Subsequent parametric perfusion maps were calculated using RAPID software. CONTRAST:  ISOVUE-370 IOPAMIDOL (ISOVUE-370) INJECTION 76% COMPARISON:  Noncontrast head CT earlier today FINDINGS: CTA NECK FINDINGS Aortic arch: No acute finding.  Three vessel branching. Right carotid system: Mild atherosclerotic plaque at the ICA bulb. No beading, stenosis, or ulceration. Left carotid system: Mild atherosclerotic plaque at the common carotid bifurcation and ICA bulb. No stenosis, ulceration, or beading. Vertebral arteries: No proximal subclavian stenosis. Mild atheromatous plaque at the vertebral origins. No stenosis or beading. Skeleton: No acute finding. Other neck: No incidental mass or inflammation. Upper chest: Negative Review of the MIP images confirms the above findings CTA HEAD FINDINGS Anterior circulation: No noted atheromatous changes. No branch occlusion or stenosis. Negative for beading or aneurysm. Posterior circulation: Vertebral and basilar arteries are smooth and widely patent. There is a left P2 segment occlusion or pre occlusion with flow gap. Venous sinuses:  Patent Anatomic  variants: None significant Delayed phase: No abnormal intracranial enhancement. Review of the MIP images confirms the above findings CT Brain Perfusion Findings: CBF (<30%) Volume: 0mL Perfusion (Tmax>6.0s) volume: 13mL These results were called by telephone at the time of interpretation on 06/15/2017 at 7:08 pm to Dr. Shaune Pollack. Patient has already been given tPA and is being transferred. IMPRESSION: 1. Left P2 segment occlusion with downstream reconstitution. No infarct by CTP but there is 13cc of measurable left occipital lobe penumbra. 2. Very mild atheromatous changes for age. No stenosis or noted embolic souce. Electronically Signed   By: Marnee Spring M.D.   On: 06/15/2017 19:20   TTE pending   PHYSICAL EXAM  Temp:  [98 F (36.7 C)-98.5 F (36.9 C)] 98.3 F (36.8 C) (01/04 0800) Pulse Rate:  [59-81] 74 (01/04 1100) Resp:  [11-25] 17 (01/04 1100) BP: (125-187)/(75-119) 157/119 (01/04 1100) SpO2:  [95 %-100 %] 98 % (01/04 1100) FiO2 (%):  [0 %] 0 % (01/03 2201) Weight:  [133 lb 2.5 oz (60.4 kg)-140 lb (63.5 kg)] 133 lb 2.5 oz (60.4 kg) (01/04 0100)  General - Well nourished, well developed, in no apparent distress.  Ophthalmologic - fundi not visualized due to noncooperation.  Cardiovascular - Regular rate and rhythm with no murmur.  Mental Status -  Level of arousal and orientation to time, place, and person were intact. Language including expression, naming, repetition, comprehension was assessed and found intact. Fund of Knowledge was assessed and was intact.  Cranial Nerves II - XII - II - right hemianopia. III, IV, VI - Extraocular movements intact. V - Facial sensation intact bilaterally. VII - Facial movement intact bilaterally. VIII - Hearing & vestibular intact bilaterally. X - Palate elevates symmetrically. XI - Chin turning & shoulder shrug intact bilaterally. XII - Tongue protrusion intact.  Motor Strength - The patient's strength was normal in all extremities and  pronator drift was absent.  Bulk was normal and fasciculations were absent.   Motor Tone - Muscle tone was assessed at the neck and appendages and was normal.  Reflexes - The patient's reflexes were symmetrical in all extremities and she had no pathological reflexes.  Sensory - Light touch, temperature/pinprick were assessed and were diminished on the RLE, but also has b/l sensory neglect.    Coordination - The patient had right UE and LE mild ataxia.  Tremor was absent.  Gait and Station - deferred.   ASSESSMENT/PLAN [name] is a 82 y.o. female with history of mild hard of hearing admitted for right sided weakness, numbness, slurry speech and right hemianopia. TPA given.  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  Resultant right hemianopia, right sensory neglect, right ataxia  MRI  Left PCA infarct including left occipital and left thalamus  CTA head and neck left P2/P3 occlusion  CTP - no core, only minimal left occipital penumbra  2D Echo  Pending  Recommend TEE/loop to rule out cardioembolic source  LDL 120  HgbA1c 5.2  SCDs for VTE prophylaxis  Diet NPO time specified   No antithrombotic prior to admission, now on No antithrombotic within 24h of tPA  Ongoing aggressive stroke risk factor management  Therapy recommendations:  pending  Disposition:  pending  Hyperlipidemia  Home meds:  none   LDL 120, goal < 70  Will start statin once passed swallow  Continue statin at discharge  Other Stroke Risk Factors  Advanced age  Other Active Problems  Family report  mild cognitive impairment over time  Hospital day # 1  This patient is critically ill due to left PCA infarct s/p tPA and at significant risk of neurological worsening, death form recurrent stroke, hemorrhagic conversion, seizure. This patient's care requires constant monitoring of vital signs, hemodynamics, respiratory and cardiac monitoring, review of multiple  databases, neurological assessment, discussion with family, other specialists and medical decision making of high complexity. I had long discussion with daqughters at bedside, updated pt current condition, treatment plan and potential prognosis. They expressed understanding and appreciation. I spent 40 minutes of neurocritical care time in the care of this patient.   Marvel PlanJindong Aarron Wierzbicki, MD PhD Stroke Neurology 06/16/2017 12:11 PM    To contact Stroke Continuity provider, please refer to [email or web address]. After hours, contact General Neurology

## 2017-06-16 NOTE — Progress Notes (Signed)
PT Cancellation Note  Patient Details Name: [name] MRN: [id number] DOB: 11/11/1935   Cancelled Treatment:    Reason Eval/Treat Not Completed: Medical issues which prohibited therapy(pt on strict bedrest s/p tPA. Await increased activity order)   [name] B [name] 06/16/2017, 7:27 AM  Delaney MeigsMaija Tabor Levell Tavano, PT [phone]

## 2017-06-16 NOTE — Plan of Care (Signed)
Patient is resting after TPA administration. Patient understands immediate plans for close monitoring and testing.

## 2017-06-16 NOTE — Progress Notes (Signed)
Inpatient Rehabilitation  Per PT request, patient was screened by Danicka Hourihan for appropriateness for an Inpatient Acute Rehab consult.  At this time we are recommending an Inpatient Rehab consult.  Please order if you are agreeable.    Davontay Watlington, M.A., CCC/SLP Admission Coordinator  [facility]  Cell [phone]  

## 2017-06-17 ENCOUNTER — Encounter (HOSPITAL_COMMUNITY): Payer: Self-pay | Admitting: *Deleted

## 2017-06-17 ENCOUNTER — Other Ambulatory Visit: Payer: Self-pay

## 2017-06-17 LAB — BASIC METABOLIC PANEL
Anion gap: 8 (ref 5–15)
BUN: 8 mg/dL (ref 6–20)
CALCIUM: 9 mg/dL (ref 8.9–10.3)
CHLORIDE: 108 mmol/L (ref 101–111)
CO2: 24 mmol/L (ref 22–32)
CREATININE: 0.73 mg/dL (ref 0.44–1.00)
GFR calc Af Amer: >60 mL/min (ref >60)
GFR calc non Af Amer: >60 mL/min (ref >60)
GLUCOSE: 104 mg/dL — AB (ref 65–99)
Potassium: 3.6 mmol/L (ref 3.5–5.1)
Sodium: 140 mmol/L (ref 135–145)

## 2017-06-17 LAB — CBC
HEMATOCRIT: 34.7 % — AB (ref 36.0–46.0)
Hemoglobin: 11.8 g/dL — ABNORMAL LOW (ref 12.0–15.0)
MCH: 32.4 pg (ref 26.0–34.0)
MCHC: 34 g/dL (ref 30.0–36.0)
MCV: 95.3 fL (ref 78.0–100.0)
Platelets: 127 10*3/uL — ABNORMAL LOW (ref 150–400)
RBC: 3.64 MIL/uL — ABNORMAL LOW (ref 3.87–5.11)
RDW: 13.2 % (ref 11.5–15.5)
WBC: 5.8 10*3/uL (ref 4.0–10.5)

## 2017-06-17 NOTE — Progress Notes (Signed)
Patient's daughter called me in the room due to concern with mothers hand-eye coordination. NIH stroke scale performed and patient went from a 6 to a 3. Patient is still having trouble with following directions, dates,  right peripheral vision and right sided hand-eye coordination.

## 2017-06-17 NOTE — Progress Notes (Signed)
STROKE TEAM PROGRESS NOTE    HISTORY PER H&P 06/15/17 Cecile SheererReva R Torelli is an 82 y.o. female who was in her USOH on Thursday when she suddenly felt dizzy and had a "controlled fall" at 1545. She was unable to get up by herself and family noted her to be weak on the right. She was complaining of right arm numbness and dizziness. EMS [facility] On arrival, she had garbled speech and right peripheral vision loss. CT head was negative for ICH. She was diagnosed with an acute stroke and tPA infusion started. [facility] had [facility]. [facility], the patient still had numbness and peripheral vision loss.  CTA head and neck with CTP:  1. Left P2 segment occlusion with downstream reconstitution. No infarct by CTP but there is 13cc of measurable left occipital lobe penumbra. 2. Very mild atheromatous changes for age. No stenosis or noted embolic souce.  LSN: [id number] tPA Given: Yes. [facility]    SUBJECTIVE (INTERVAL HISTORY) No events overnight.    OBJECTIVE Temp:  [97.9 F (36.6 C)-98.7 F (37.1 C)] 98.4 F (36.9 C) (01/05 0955) Pulse Rate:  [67-80] 74 (01/05 0955) Cardiac Rhythm: Normal sinus rhythm (01/05 0703) Resp:  [15-21] 20 (01/05 0955) BP: (130-180)/(73-128) 130/80 (01/05 0955) SpO2:  [96 %-100 %] 98 % (01/05 0955) Weight:  [139 lb 15.9 oz (63.5 kg)] 139 lb 15.9 oz (63.5 kg) (01/04 2312)  Recent Labs  Lab 06/15/17 1719  GLUCAP 115*   Recent Labs  Lab 06/15/17 1707 06/16/17 0802 06/17/17 0413  NA 138 138 140  K 4.1 3.9 3.6  CL 105 108 108  CO2 22 23 24   GLUCOSE 127* 110* 104*  BUN 18 10 8   CREATININE 0.76 0.72 0.73  CALCIUM 9.3 9.1 9.0   Recent Labs  Lab 06/15/17 1707  AST 33  ALT 19  ALKPHOS 76  BILITOT 0.4  PROT 6.7  ALBUMIN 4.1   Recent Labs  Lab 06/15/17 1707 06/16/17 0802 06/17/17 0413  WBC 4.4 7.9 5.8  NEUTROABS 1.9  --   --   HGB 12.8 11.9* 11.8*  HCT 37.0 35.8* 34.7*  MCV 94.7 94.5 95.3  PLT  145* 130* 127*   Recent Labs  Lab 06/15/17 1707  TROPONINI <0.03   Recent Labs    06/15/17 1707  LABPROT 12.8  INR 0.97   No results for input(s): COLORURINE, LABSPEC, PHURINE, GLUCOSEU, HGBUR, BILIRUBINUR, KETONESUR, PROTEINUR, UROBILINOGEN, NITRITE, LEUKOCYTESUR in the last 72 hours.  Invalid input(s): APPERANCEUR     Component Value Date/Time   CHOL 213 (H) 06/16/2017 0440   TRIG 65 06/16/2017 0440   HDL 80 06/16/2017 0440   CHOLHDL 2.7 06/16/2017 0440   VLDL 13 06/16/2017 0440   LDLCALC 120 (H) 06/16/2017 0440   Lab Results  Component Value Date   HGBA1C 5.2 06/16/2017   No results found for: LABOPIA, COCAINSCRNUR, LABBENZ, AMPHETMU, THCU, LABBARB  Recent Labs  Lab 06/15/17 1707  ETH <10    IMAGING  I have personally reviewed the radiological images below and agree with the radiology interpretations.   Ct Angio Head W Or Wo Contrast Ct Angio Neck W Or Wo Contrast Ct Cerebral Perfusion W Contrast 06/15/2017 IMPRESSION:  1. Left P2 segment occlusion with downstream reconstitution. No infarct by CTP but there is 13cc of measurable left occipital lobe penumbra.  2. Very mild atheromatous changes for age. No stenosis or noted embolic souce.  Ct Head Wo Contrast 06/16/2017 IMPRESSION:  1. Stable distribution of left PCA infarct. Interval increase in edema and local mass effect. No hemorrhage or herniation.  2. No new acute intracranial abnormality identified.    Ct Head Wo Contrast 06/15/2017 IMPRESSION:  Negative head CT.     Mr Brain Wo Contrast 06/16/2017 IMPRESSION:  1. Moderate sized acute ischemic nonhemorrhagic left PCA territory infarct.  2. Otherwise normal brain MRI.    TTE  06/16/2017 Study Conclusions - Left ventricle: The cavity size was normal. Systolic function was   vigorous. The estimated ejection fraction was in the range of 65%   to 70%. Wall motion was normal; there were no regional wall   motion abnormalities. - Aortic valve:  Trileaflet; moderately thickened, moderately   calcified leaflets. - Aorta: Aortic root diameter at sino-tubular junction: 42 mm (ED). - Aortic root: The aortic root was mildly dilated. - Mitral valve: Moderately thickened leaflet, anterior, 1.2cm in   diameter. Consider TEE for further clarification (potential   papillary fibroelastoma). - Right ventricle: The cavity size was mildly dilated. Wall   thickness was normal. - Pulmonary arteries: Systolic pressure was mildly increased. PA   peak pressure: 35 mm Hg (S).   PHYSICAL EXAM  Temp:  [97.9 F (36.6 C)-98.7 F (37.1 C)] 98.4 F (36.9 C) (01/05 0955) Pulse Rate:  [67-80] 74 (01/05 0955) Resp:  [15-21] 20 (01/05 0955) BP: (130-180)/(73-128) 130/80 (01/05 0955) SpO2:  [96 %-100 %] 98 % (01/05 0955) Weight:  [139 lb 15.9 oz (63.5 kg)] 139 lb 15.9 oz (63.5 kg) (01/04 2312)  General - Well nourished, well developed, in no apparent distress.  Ophthalmologic - fundi not visualized due to noncooperation.  Cardiovascular - Regular rate and rhythm with no murmur.  Mental Status -  Level of arousal and orientation to time, place, and person were intact. Language including expression, naming, repetition, comprehension was assessed and found intact. Fund of Knowledge was assessed and was intact.  Cranial Nerves II - XII - II - right hemianopia. III, IV, VI - Extraocular movements intact. V - Facial sensation intact bilaterally. VII - Facial movement intact bilaterally. VIII - Hearing & vestibular intact bilaterally. X - Palate elevates symmetrically. XI - Chin turning & shoulder shrug intact bilaterally. XII - Tongue protrusion intact.  Motor Strength - The patient's strength was normal in all extremities and pronator drift was absent.  Bulk was normal and fasciculations were absent.   Motor Tone - Muscle tone was assessed at the neck and appendages and was normal.  Reflexes - The patient's reflexes were symmetrical in all  extremities and she had no pathological reflexes.  Sensory - Light touch, temperature/pinprick were assessed and were diminished on the RLE, but also has b/l sensory neglect.    Coordination - The patient had right UE and LE mild ataxia.  Tremor was absent.  Gait and Station - deferred.   ASSESSMENT/PLAN Ms. PANSIE GUGGISBERG is a 82 y.o. female with history of hearing impairement admitted for right sided weakness, numbness, slurry speech and right hemianopia. TPA given.  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  Resultant right hemianopia, right sensory neglect, right ataxia  MRI  Left PCA infarct including left occipital and left thalamus  CTA head and neck left P2/P3 occlusion  CTP - no core, only minimal left occipital penumbra  2D Echo - TEE for further clarification (potential papillary fibroelastoma).  Recommend TEE/loop to rule out cardioembolic source  LDL 120  HgbA1c 5.2  SCDs for VTE prophylaxis  DIET DYS 3 Room service appropriate? Yes; Fluid consistency: Thin   No antithrombotic prior to admission, now on No antithrombotic within 24h of tPA  Ongoing aggressive stroke risk factor management  Therapy recommendations:  CIR recommended. Dr Riley Kill has see patient.  Disposition:  pending  Hyperlipidemia  Home meds:  none   LDL 120, goal < 70  Now on Lipitor 20 mg daily  Continue statin at discharge  Other Stroke Risk Factors  Advanced age  Other Active Problems  Family report mild cognitive impairment over time   Assessment Plan  TEE and loop Monday  Hospital day # 2  This patient is critically ill due to left PCA infarct s/p tPA and at significant risk of neurological worsening, death form recurrent stroke, hemorrhagic conversion, seizure. This patient's care requires constant monitoring of vital signs, hemodynamics, respiratory and cardiac monitoring, review of multiple databases, neurological assessment, discussion with  family, other specialists and medical decision making of high complexity. I had long discussion with daqughters at bedside, updated pt current condition, treatment plan and potential prognosis. They expressed understanding and appreciation. I spent 40 minutes of neurocritical care time in the care of this patient.      To contact Stroke Continuity provider, please refer to [email or web address]. After hours, contact General Neurology

## 2017-06-18 ENCOUNTER — Inpatient Hospital Stay (HOSPITAL_COMMUNITY): Payer: Medicare Other

## 2017-06-18 LAB — BASIC METABOLIC PANEL
Anion gap: 8 (ref 5–15)
BUN: 13 mg/dL (ref 6–20)
CHLORIDE: 104 mmol/L (ref 101–111)
CO2: 22 mmol/L (ref 22–32)
CREATININE: 0.78 mg/dL (ref 0.44–1.00)
Calcium: 9.3 mg/dL (ref 8.9–10.3)
GFR calc Af Amer: >60 mL/min (ref >60)
GLUCOSE: 101 mg/dL — AB (ref 65–99)
Potassium: 3.7 mmol/L (ref 3.5–5.1)
SODIUM: 134 mmol/L — AB (ref 135–145)

## 2017-06-18 LAB — CBC
HCT: 35.5 % — ABNORMAL LOW (ref 36.0–46.0)
Hemoglobin: 11.8 g/dL — ABNORMAL LOW (ref 12.0–15.0)
MCH: 31.3 pg (ref 26.0–34.0)
MCHC: 33.2 g/dL (ref 30.0–36.0)
MCV: 94.2 fL (ref 78.0–100.0)
PLATELETS: 146 10*3/uL — AB (ref 150–400)
RBC: 3.77 MIL/uL — ABNORMAL LOW (ref 3.87–5.11)
RDW: 12.7 % (ref 11.5–15.5)
WBC: 5.9 10*3/uL (ref 4.0–10.5)

## 2017-06-18 MED ORDER — PROPOFOL 1000 MG/100ML IV EMUL: 5.0000 ug/kg/min | INTRAVENOUS | Status: DC

## 2017-06-18 MED ORDER — SODIUM CHLORIDE 0.9 % IV SOLN
INTRAVENOUS | Status: DC
  Administered 2017-06-18: 15:00:00 via INTRAVENOUS

## 2017-06-18 NOTE — Progress Notes (Signed)
[facility] pt will be transport to 4N20. Family aware of disposition.   Sim BoastHavy, RN

## 2017-06-18 NOTE — Progress Notes (Addendum)
Pt c/o worsening dizzness and "inability to focus" comparing to yesterday. Pt denied nausea/pain/HA. Family at bedside. Stroke provider paged.    Puja Caffey, RN.

## 2017-06-18 NOTE — Progress Notes (Signed)
Pt arrived from 3W. Alert, oriented x3 Pleasant. RT peripheral deficit. RT leg less sensitive. States: " I feel woozy". VS stable, RS. Education was given. Family at the bedside.

## 2017-06-18 NOTE — H&P (View-Only) (Signed)
STROKE TEAM PROGRESS NOTE    HISTORY PER H&P 06/15/17 Cecile SheererReva R Clack is an 82 y.o. female who was in her USOH on Thursday when she suddenly felt dizzy and had a "controlled fall" at 1545. She was unable to get up by herself and family noted her to be weak on the right. She was complaining of right arm numbness and dizziness. EMS [facility]. On arrival, she had garbled speech and right peripheral vision loss. CT head was negative for ICH. She was diagnosed with an acute stroke and tPA infusion started. [facility], the patient still had numbness and peripheral vision loss.  CTA head and neck with CTP:  1. Left P2 segment occlusion with downstream reconstitution. No infarct by CTP but there is 13cc of measurable left occipital lobe penumbra. 2. Very mild atheromatous changes for age. No stenosis or noted embolic souce.  LSN: [id number] tPA Given: Yes. [facility] at [facility]    SUBJECTIVE (INTERVAL HISTORY) She feels dizzier today and blurrier. Her right thumb feels numb but this is not new. CT head should increased edema, will move to Neuro-ICU to monitor more closely, discussed with family   OBJECTIVE Temp:  [98.1 F (36.7 C)-98.4 F (36.9 C)] 98.1 F (36.7 C) (01/06 0557) Pulse Rate:  [63-77] 71 (01/06 0557) Cardiac Rhythm: Normal sinus rhythm (01/06 0702) Resp:  [20] 20 (01/06 0557) BP: (130-155)/(64-82) 130/64 (01/06 0557) SpO2:  [97 %-100 %] 97 % (01/06 0557)  Recent Labs  Lab 06/15/17 1719  GLUCAP 115*   Recent Labs  Lab 06/15/17 1707 06/16/17 0802 06/17/17 0413 06/18/17 0314  NA 138 138 140 134*  K 4.1 3.9 3.6 3.7  CL 105 108 108 104  CO2 22 23 24 22   GLUCOSE 127* 110* 104* 101*  BUN 18 10 8 13   CREATININE 0.76 0.72 0.73 0.78  CALCIUM 9.3 9.1 9.0 9.3   Recent Labs  Lab 06/15/17 1707  AST 33  ALT 19  ALKPHOS 76  BILITOT 0.4  PROT 6.7  ALBUMIN 4.1   Recent Labs  Lab 06/15/17 1707 06/16/17 0802  06/17/17 0413 06/18/17 0314  WBC 4.4 7.9 5.8 5.9  NEUTROABS 1.9  --   --   --   HGB 12.8 11.9* 11.8* 11.8*  HCT 37.0 35.8* 34.7* 35.5*  MCV 94.7 94.5 95.3 94.2  PLT 145* 130* 127* 146*   Recent Labs  Lab 06/15/17 1707  TROPONINI <0.03   Recent Labs    06/15/17 1707  LABPROT 12.8  INR 0.97   No results for input(s): COLORURINE, LABSPEC, PHURINE, GLUCOSEU, HGBUR, BILIRUBINUR, KETONESUR, PROTEINUR, UROBILINOGEN, NITRITE, LEUKOCYTESUR in the last 72 hours.  Invalid input(s): APPERANCEUR     Component Value Date/Time   CHOL 213 (H) 06/16/2017 0440   TRIG 65 06/16/2017 0440   HDL 80 06/16/2017 0440   CHOLHDL 2.7 06/16/2017 0440   VLDL 13 06/16/2017 0440   LDLCALC 120 (H) 06/16/2017 0440   Lab Results  Component Value Date   HGBA1C 5.2 06/16/2017   No results found for: LABOPIA, COCAINSCRNUR, LABBENZ, AMPHETMU, THCU, LABBARB  Recent Labs  Lab 06/15/17 1707  ETH <10    IMAGING  I have personally reviewed the radiological images below and agree with the radiology interpretations.   Ct Angio Head W Or Wo Contrast Ct Angio Neck W Or Wo Contrast Ct Cerebral Perfusion W Contrast 06/15/2017 IMPRESSION:  1. Left P2 segment occlusion with downstream reconstitution.  No infarct by CTP but there is 13cc of measurable left occipital lobe penumbra.  2. Very mild atheromatous changes for age. No stenosis or noted embolic souce.     Ct Head Wo Contrast 06/16/2017 IMPRESSION:  1. Stable distribution of left PCA infarct. Interval increase in edema and local mass effect. No hemorrhage or herniation.  2. No new acute intracranial abnormality identified.    Ct Head Wo Contrast 06/15/2017 IMPRESSION:  Negative head CT.   CT Head 06/18/2017  Increased edema and mass effect associated with the LEFT PCA territory infarction in the occipital and temporal lobes, when compared with most recent prior CT 06/16/2017.  Central heterogeneity suggests reperfusion microhemorrhage. If  clinically indicated, MR is more sensitive in the detection of such.    Mr Brain Wo Contrast 06/16/2017 IMPRESSION:  1. Moderate sized acute ischemic nonhemorrhagic left PCA territory infarct.  2. Otherwise normal brain MRI.    TTE  06/16/2017 Study Conclusions - Left ventricle: The cavity size was normal. Systolic function was   vigorous. The estimated ejection fraction was in the range of 65%   to 70%. Wall motion was normal; there were no regional wall   motion abnormalities. - Aortic valve: Trileaflet; moderately thickened, moderately   calcified leaflets. - Aorta: Aortic root diameter at sino-tubular junction: 42 mm (ED). - Aortic root: The aortic root was mildly dilated. - Mitral valve: Moderately thickened leaflet, anterior, 1.2cm in   diameter. Consider TEE for further clarification (potential   papillary fibroelastoma). - Right ventricle: The cavity size was mildly dilated. Wall   thickness was normal. - Pulmonary arteries: Systolic pressure was mildly increased. PA   peak pressure: 35 mm Hg (S).   PHYSICAL EXAM  Temp:  [98.1 F (36.7 C)-98.4 F (36.9 C)] 98.1 F (36.7 C) (01/06 0557) Pulse Rate:  [63-77] 71 (01/06 0557) Resp:  [20] 20 (01/06 0557) BP: (130-155)/(64-82) 130/64 (01/06 0557) SpO2:  [97 %-100 %] 97 % (01/06 0557)  General - Well nourished, well developed, in no apparent distress.  Ophthalmologic - fundi not visualized due to noncooperation.  Cardiovascular - Regular rate and rhythm with no murmur.  Mental Status -  Level of arousal and orientation to time, place, and person were intact. Language including expression, naming, repetition, comprehension was assessed and found intact. Fund of Knowledge was assessed and was intact.  Cranial Nerves II - XII - II - right hemianopia. III, IV, VI - Extraocular movements intact. V - Facial sensation intact bilaterally. VII - Facial movement intact bilaterally. VIII - Hearing & vestibular intact  bilaterally. X - Palate elevates symmetrically. XI - Chin turning & shoulder shrug intact bilaterally. XII - Tongue protrusion intact.  Motor Strength - The patient's strength was normal in all extremities and pronator drift was absent.  Bulk was normal and fasciculations were absent.   Motor Tone - Muscle tone was assessed at the neck and appendages and was normal.  Reflexes - The patient's reflexes were symmetrical in all extremities and she had no pathological reflexes.  Sensory - Light touch, temperature/pinprick were assessed and were diminished on the RLE, but also has b/l sensory neglect.    Coordination - The patient had right UE and LE mild ataxia.  Tremor was absent.  Gait and Station - deferred.   ASSESSMENT/PLAN Ms. SALEEMAH MOLLENHAUER is a 82 y.o. female with history of hearing impairement admitted for right sided weakness, numbness, slurry speech and right hemianopia. TPA given.  Stroke:  left PCA infarct embolic  secondary to unclear source  Resultant right hemianopia, right sensory neglect, right ataxia  MRI  Left PCA infarct including left occipital and left thalamus  CT head shows increased edema, transfer to NeuroICU for closer monitoring  CTA head and neck left P2/P3 occlusion  CTP - no core, only minimal left occipital penumbra  2D Echo - TEE for further clarification (potential papillary fibroelastoma).  Recommend TEE/loop to rule out cardioembolic source  LDL 120  HgbA1c 5.2  SCDs for VTE prophylaxis  DIET DYS 3 Room service appropriate? Yes; Fluid consistency: Thin   No antithrombotic prior to admission, now on No antithrombotic within 24h of tPA  Ongoing aggressive stroke risk factor management  Therapy recommendations:  CIR recommended. Dr Riley Kill has see patient.  Disposition:  pending  Hyperlipidemia  Home meds:  none   LDL 120, goal < 70  Now on Lipitor 20 mg daily  Continue statin at discharge  Other Stroke Risk Factors  Advanced  age  Other Active Problems  Family report mild cognitive impairment over time   Assessment Plan  TEE and loop Monday  CT Head:shows  Increased edema and mass effect associated with the LEFT PCA territory infarction in the occipital and temporal lobes, moved to neuroicu for closer monitoring  Hospital day # 3   Personally  participated in, made any corrections needed, and agree with history, physical, neuro exam,assessment and plan as stated above.     Naomie Dean, MD Stroke Neurology [phone]    To contact Stroke Continuity provider, please refer to [email or web address]. After hours, contact General Neurology

## 2017-06-18 NOTE — Progress Notes (Signed)
STROKE TEAM PROGRESS NOTE    HISTORY PER H&P 06/15/17 [name] is an 82 y.o. female who was in her USOH on Thursday when she suddenly felt dizzy and had a "controlled fall" at 1545. She was unable to get up by herself and family noted her to be weak on the right. She was complaining of right arm numbness and dizziness. EMS transported the patient to [facility]. On arrival, she had garbled speech and right peripheral vision loss. CT head was negative for ICH. She was diagnosed with an acute stroke and tPA infusion started. She had CTA prior to transfer to [facility]. On arrival to [facility], the patient still had numbness and peripheral vision loss.  CTA head and neck with CTP:  1. Left P2 segment occlusion with downstream reconstitution. No infarct by CTP but there is 13cc of measurable left occipital lobe penumbra. 2. Very mild atheromatous changes for age. No stenosis or noted embolic souce.  LSN: [id number] tPA Given: Yes. Administered at [facility]    SUBJECTIVE (INTERVAL HISTORY) She feels dizzier today and blurrier. Her right thumb feels numb but this is not new. CT head should increased edema, will move to Neuro-ICU to monitor more closely, discussed with family   OBJECTIVE Temp:  [98.1 F (36.7 C)-98.4 F (36.9 C)] 98.1 F (36.7 C) (01/06 0557) Pulse Rate:  [63-77] 71 (01/06 0557) Cardiac Rhythm: Normal sinus rhythm (01/06 0702) Resp:  [20] 20 (01/06 0557) BP: (130-155)/(64-82) 130/64 (01/06 0557) SpO2:  [97 %-100 %] 97 % (01/06 0557)  Recent Labs  Lab 06/15/17 1719  GLUCAP 115*   Recent Labs  Lab 06/15/17 1707 06/16/17 0802 06/17/17 0413 06/18/17 0314  NA 138 138 140 134*  K 4.1 3.9 3.6 3.7  CL 105 108 108 104  CO2 22 23 24 22  GLUCOSE 127* 110* 104* 101*  BUN 18 10 8 13  CREATININE 0.76 0.72 0.73 0.78  CALCIUM 9.3 9.1 9.0 9.3   Recent Labs  Lab 06/15/17 1707  AST 33  ALT 19  ALKPHOS 76  BILITOT 0.4  PROT 6.7  ALBUMIN 4.1   Recent Labs  Lab 06/15/17 1707 06/16/17 0802  06/17/17 0413 06/18/17 0314  WBC 4.4 7.9 5.8 5.9  NEUTROABS 1.9  --   --   --   HGB 12.8 11.9* 11.8* 11.8*  HCT 37.0 35.8* 34.7* 35.5*  MCV 94.7 94.5 95.3 94.2  PLT 145* 130* 127* 146*   Recent Labs  Lab 06/15/17 1707  TROPONINI <0.03   Recent Labs    06/15/17 1707  LABPROT 12.8  INR 0.97   No results for input(s): COLORURINE, LABSPEC, PHURINE, GLUCOSEU, HGBUR, BILIRUBINUR, KETONESUR, PROTEINUR, UROBILINOGEN, NITRITE, LEUKOCYTESUR in the last 72 hours.  Invalid input(s): APPERANCEUR     Component Value Date/Time   CHOL 213 (H) 06/16/2017 0440   TRIG 65 06/16/2017 0440   HDL 80 06/16/2017 0440   CHOLHDL 2.7 06/16/2017 0440   VLDL 13 06/16/2017 0440   LDLCALC 120 (H) 06/16/2017 0440   Lab Results  Component Value Date   HGBA1C 5.2 06/16/2017   No results found for: LABOPIA, COCAINSCRNUR, LABBENZ, AMPHETMU, THCU, LABBARB  Recent Labs  Lab 06/15/17 1707  ETH <10    IMAGING  I have personally reviewed the radiological images below and agree with the radiology interpretations.   Ct Angio Head W Or Wo Contrast Ct Angio Neck W Or Wo Contrast Ct Cerebral Perfusion W Contrast 06/15/2017 IMPRESSION:  1. Left P2 segment occlusion with downstream reconstitution.   No infarct by CTP but there is 13cc of measurable left occipital lobe penumbra.  2. Very mild atheromatous changes for age. No stenosis or noted embolic souce.     Ct Head Wo Contrast 06/16/2017 IMPRESSION:  1. Stable distribution of left PCA infarct. Interval increase in edema and local mass effect. No hemorrhage or herniation.  2. No new acute intracranial abnormality identified.    Ct Head Wo Contrast 06/15/2017 IMPRESSION:  Negative head CT.   CT Head 06/18/2017  Increased edema and mass effect associated with the LEFT PCA territory infarction in the occipital and temporal lobes, when compared with most recent prior CT 06/16/2017.  Central heterogeneity suggests reperfusion microhemorrhage. If  clinically indicated, MR is more sensitive in the detection of such.    Mr Brain Wo Contrast 06/16/2017 IMPRESSION:  1. Moderate sized acute ischemic nonhemorrhagic left PCA territory infarct.  2. Otherwise normal brain MRI.    TTE  06/16/2017 Study Conclusions - Left ventricle: The cavity size was normal. Systolic function was   vigorous. The estimated ejection fraction was in the range of 65%   to 70%. Wall motion was normal; there were no regional wall   motion abnormalities. - Aortic valve: Trileaflet; moderately thickened, moderately   calcified leaflets. - Aorta: Aortic root diameter at sino-tubular junction: 42 mm (ED). - Aortic root: The aortic root was mildly dilated. - Mitral valve: Moderately thickened leaflet, anterior, 1.2cm in   diameter. Consider TEE for further clarification (potential   papillary fibroelastoma). - Right ventricle: The cavity size was mildly dilated. Wall   thickness was normal. - Pulmonary arteries: Systolic pressure was mildly increased. PA   peak pressure: 35 mm Hg (S).   PHYSICAL EXAM  Temp:  [98.1 F (36.7 C)-98.4 F (36.9 C)] 98.1 F (36.7 C) (01/06 0557) Pulse Rate:  [63-77] 71 (01/06 0557) Resp:  [20] 20 (01/06 0557) BP: (130-155)/(64-82) 130/64 (01/06 0557) SpO2:  [97 %-100 %] 97 % (01/06 0557)  General - Well nourished, well developed, in no apparent distress.  Ophthalmologic - fundi not visualized due to noncooperation.  Cardiovascular - Regular rate and rhythm with no murmur.  Mental Status -  Level of arousal and orientation to time, place, and person were intact. Language including expression, naming, repetition, comprehension was assessed and found intact. Fund of Knowledge was assessed and was intact.  Cranial Nerves II - XII - II - right hemianopia. III, IV, VI - Extraocular movements intact. V - Facial sensation intact bilaterally. VII - Facial movement intact bilaterally. VIII - Hearing & vestibular intact  bilaterally. X - Palate elevates symmetrically. XI - Chin turning & shoulder shrug intact bilaterally. XII - Tongue protrusion intact.  Motor Strength - The patient's strength was normal in all extremities and pronator drift was absent.  Bulk was normal and fasciculations were absent.   Motor Tone - Muscle tone was assessed at the neck and appendages and was normal.  Reflexes - The patient's reflexes were symmetrical in all extremities and she had no pathological reflexes.  Sensory - Light touch, temperature/pinprick were assessed and were diminished on the RLE, but also has b/l sensory neglect.    Coordination - The patient had right UE and LE mild ataxia.  Tremor was absent.  Gait and Station - deferred.   ASSESSMENT/PLAN Ms. Jodel R Hourihan is a 82 y.o. female with history of hearing impairement admitted for right sided weakness, numbness, slurry speech and right hemianopia. TPA given.  Stroke:  left PCA infarct embolic   secondary to unclear source  Resultant right hemianopia, right sensory neglect, right ataxia  MRI  Left PCA infarct including left occipital and left thalamus  CT head shows increased edema, transfer to NeuroICU for closer monitoring  CTA head and neck left P2/P3 occlusion  CTP - no core, only minimal left occipital penumbra  2D Echo - TEE for further clarification (potential papillary fibroelastoma).  Recommend TEE/loop to rule out cardioembolic source  LDL 120  HgbA1c 5.2  SCDs for VTE prophylaxis  DIET DYS 3 Room service appropriate? Yes; Fluid consistency: Thin   No antithrombotic prior to admission, now on No antithrombotic within 24h of tPA  Ongoing aggressive stroke risk factor management  Therapy recommendations:  CIR recommended. Dr Swartz has see patient.  Disposition:  pending  Hyperlipidemia  Home meds:  none   LDL 120, goal < 70  Now on Lipitor 20 mg daily  Continue statin at discharge  Other Stroke Risk Factors  Advanced  age  Other Active Problems  Family report mild cognitive impairment over time   Assessment Plan  TEE and loop Monday  CT Head:shows  Increased edema and mass effect associated with the LEFT PCA territory infarction in the occipital and temporal lobes, moved to neuroicu for closer monitoring  Hospital day # 3   Personally  participated in, made any corrections needed, and agree with history, physical, neuro exam,assessment and plan as stated above.     Antonia Ahern, MD Stroke Neurology [phone]    To contact Stroke Continuity provider, please refer to [email or web address]. After hours, contact General Neurology 

## 2017-06-18 NOTE — Progress Notes (Addendum)
Family and patient both expressed the wish that she be changed to a Full Code in order to be resuscitated should there be a complication from the TEE scheduled for tomorrow. Fully discussed the ramifications of this as well as my agreement that switching to Full Code would be in the best interests of the patient. Verbal consent to switch code status witnessed by patient's RN. Verified with family that Full Code constitutes full cardiac resuscitative measures in the event of cardiac arrest as well as clearance to intubate the patient if that should be indicated.   Electronically signed: Dr. Caryl PinaEric Kingstin Heims

## 2017-06-19 ENCOUNTER — Encounter (HOSPITAL_COMMUNITY): Payer: Self-pay | Admitting: *Deleted

## 2017-06-19 ENCOUNTER — Encounter (HOSPITAL_COMMUNITY)
Admission: EM | Disposition: A | Discharge status: Rehabilitation Facility | Payer: Self-pay | Source: Home / Self Care | Attending: Neurology

## 2017-06-19 ENCOUNTER — Inpatient Hospital Stay (HOSPITAL_COMMUNITY): Payer: Medicare Other

## 2017-06-19 DIAGNOSIS — I340 Nonrheumatic mitral (valve) insufficiency: Secondary | ICD-10-CM

## 2017-06-19 DIAGNOSIS — I639 Cerebral infarction, unspecified: Secondary | ICD-10-CM

## 2017-06-19 DIAGNOSIS — I6300 Cerebral infarction due to thrombosis of unspecified precerebral artery: Secondary | ICD-10-CM

## 2017-06-19 HISTORY — PX: TEE WITHOUT CARDIOVERSION: SHX5443

## 2017-06-19 SURGERY — ECHOCARDIOGRAM, TRANSESOPHAGEAL
Anesthesia: Moderate Sedation

## 2017-06-19 MED ORDER — MIDAZOLAM HCL 5 MG/ML IJ SOLN
INTRAMUSCULAR | Status: AC
  Filled 2017-06-19: qty 2

## 2017-06-19 MED ORDER — BUTAMBEN-TETRACAINE-BENZOCAINE 2-2-14 % EX AERO
INHALATION_SPRAY | CUTANEOUS | Status: DC | PRN
  Administered 2017-06-19: 2 via TOPICAL

## 2017-06-19 MED ORDER — MIDAZOLAM HCL 10 MG/2ML IJ SOLN
INTRAMUSCULAR | Status: DC | PRN
  Administered 2017-06-19: 2 mg via INTRAVENOUS

## 2017-06-19 MED ORDER — FENTANYL CITRATE (PF) 100 MCG/2ML IJ SOLN
INTRAMUSCULAR | Status: AC
  Filled 2017-06-19: qty 2

## 2017-06-19 MED ORDER — FENTANYL CITRATE (PF) 100 MCG/2ML IJ SOLN
INTRAMUSCULAR | Status: DC | PRN
  Administered 2017-06-19: 25 ug via INTRAVENOUS

## 2017-06-19 NOTE — Progress Notes (Addendum)
Physical Therapy Treatment Patient Details Name: Angela SheererReva R Crom MRN: [id number] DOB: 01/20/1936 Today's Date: 06/19/2017    History of Present Illness 82 yo admitted after episode of dizziness with fall with right weakness and vision loss. Pt with Left PCA infarct s/p tPA. No PMHx    PT Comments    Pt with improved balance, gait and cognition. Pt with 5/5 bil LE strength today with decreased light touch to the RLE with testing. Pt with equal proprioception bil LE today which is significantly improved from eval. Pt not impulsive and actually very guarded and slow with gait with RW today.Pt continues to have right visual field cut and blurry vision today. Pt continues to have balance, vision, gait and mobility deficits along with cognition and problem solving and remains CIR appropriate. Daughter French Anaracy present this session stating pt has a low education level and needs things stated in simple terms and that she was experiencing decreased STM at baseline.     Follow Up Recommendations  CIR;Supervision/Assistance - 24 hour     Equipment Recommendations  Rolling walker with 5" wheels;3in1 (PT)    Recommendations for Other Services       Precautions / Restrictions Precautions Precautions: Fall Precaution Comments: right inattention, right hemianopsia    Mobility  Bed Mobility Overal bed mobility: Needs Assistance Bed Mobility: Supine to Sit     Supine to sit: Supervision     General bed mobility comments: supervision for lines and safety  Transfers Overall transfer level: Needs assistance   Transfers: Sit to/from Stand Sit to Stand: Min assist         General transfer comment: cues for hand placement, safety and sequence, pt with increased time and cues to attend to rail and armrest to her right  Ambulation/Gait Ambulation/Gait assistance: Min assist Ambulation Distance (Feet): 150 Feet Assistive device: Rolling walker (2 wheeled);2 person hand held assist Gait  Pattern/deviations: Step-through pattern;Decreased stride length;Narrow base of support   Gait velocity interpretation: Below normal speed for age/gender General Gait Details: pt with narrow BOS with posterior lean in initial standing and with gait. with use of RW pt able to shift weight anteriorly without physical assist. cues for position in RW, safety and safety in hall. Pt with dizziness and blurry vision with need for visual targets and awareness in hall. Pt able to read and recall room number but needs cues to find sign for number. Increased narrowing BOS with fatigue. Very slow cautious gait   Stairs            Wheelchair Mobility    Modified Rankin (Stroke Patients Only) Modified Rankin (Stroke Patients Only) Pre-Morbid Rankin Score: No symptoms Modified Rankin: Moderately severe disability     Balance Overall balance assessment: Needs assistance Sitting-balance support: Feet supported Sitting balance-Leahy Scale: Fair Sitting balance - Comments: posterior lean with cues to return to midline and plant feet on the ground Postural control: Posterior lean Standing balance support: Bilateral upper extremity supported Standing balance-Leahy Scale: Poor Standing balance comment: required bil UE support to maintain position in standing                            Cognition Arousal/Alertness: Awake/alert Behavior During Therapy: WFL for tasks assessed/performed Overall Cognitive Status: Impaired/Different from baseline Area of Impairment: Orientation;Attention;Memory;Following commands;Safety/judgement;Problem solving                 Orientation Level: Disoriented to;Time Current Attention Level: Sustained Memory: Decreased  short-term memory Following Commands: Follows one step commands inconsistently;Follows one step commands with increased time Safety/Judgement: Decreased awareness of safety;Decreased awareness of deficits   Problem Solving: Requires  tactile cues;Requires verbal cues;Decreased initiation        Exercises      General Comments        Pertinent Vitals/Pain Pain Assessment: No/denies pain    Home Living                      Prior Function            PT Goals (current goals can now be found in the care plan section) Progress towards PT goals: Progressing toward goals    Frequency    Min 4X/week      PT Plan Current plan remains appropriate    Co-evaluation PT/OT/SLP Co-Evaluation/Treatment: Yes Reason for Co-Treatment: Complexity of the patient's impairments (multi-system involvement);For patient/therapist safety PT goals addressed during session: Mobility/safety with mobility;Balance;Proper use of DME        AM-PAC PT "6 Clicks" Daily Activity  Outcome Measure  Difficulty turning over in bed (including adjusting bedclothes, sheets and blankets)?: A Little Difficulty moving from lying on back to sitting on the side of the bed? : A Little Difficulty sitting down on and standing up from a chair with arms (e.g., wheelchair, bedside commode, etc,.)?: A Little Help needed moving to and from a bed to chair (including a wheelchair)?: A Little Help needed walking in hospital room?: A Lot Help needed climbing 3-5 steps with a railing? : A Lot 6 Click Score: 16    End of Session Equipment Utilized During Treatment: Gait belt Activity Tolerance: Patient tolerated treatment well Patient left: in chair;with call bell/phone within reach;with chair alarm set;with family/visitor present Nurse Communication: Mobility status;Precautions PT Visit Diagnosis: Other abnormalities of gait and mobility (R26.89);Other symptoms and signs involving the nervous system (R29.898);Difficulty in walking, not elsewhere classified (R26.2);Unsteadiness on feet (R26.81)     Time: 1610-9604 PT Time Calculation (min) (ACUTE ONLY): 38 min  Charges:  $Gait Training: 8-22 mins $Therapeutic Activity: 8-22 mins                     G Codes:       [name], PT [phone]    Zamani Crocker B Ruie Sendejo 06/19/2017, 9:09 AM

## 2017-06-19 NOTE — Interval H&P Note (Signed)
History and Physical Interval Note:  06/19/2017 10:30 AM  [name]  has presented today for surgery, with the diagnosis of stroke  The various methods of treatment have been discussed with the patient and family. After consideration of risks, benefits and other options for treatment, the patient has consented to  Procedure(s): TRANSESOPHAGEAL ECHOCARDIOGRAM (TEE) WITH LOOP (N/A) as a surgical intervention .  The patient's history has been reviewed, patient examined, no change in status, stable for surgery.  I have reviewed the patient's chart and labs.  Questions were answered to the patient's satisfaction.     Coca ColaMark Kendle Erker

## 2017-06-19 NOTE — PMR Pre-admission (Signed)
PMR Admission Coordinator Pre-Admission Assessment  Patient: [name] is an 82 y.o., female MRN: [id number] DOB: 1936-05-10 Height: 5\' 7"  (170.2 cm) Weight: 60.7 kg (133 lb 13.1 oz)              Insurance Information HMO:     PPO:      PCP:      IPA:      80/20: yes     OTHER: no HMO PRIMARY: Medicare a and b      Policy#: [id number]      Subscriber: pt Eff. Date: 02/12/2003     Deduct: $1364      Out of Pocket Max: none      Life Max: none CIR: 100%      SNF: 20 full days Outpatient: 80%     Co-Pay: 20% Home Health: 100%      Co-Pay: none DME: 80%     Co-Pay: 20% Providers: pt choice  SECONDARY: none        Medicaid Application Date:       Case Manager:  Disability Application Date:       Case Worker:   Emergency Contact Information Contact Information    Name Relation Home Work [place] R Sister [phone]     Lamar Sprinkles Daughter   [phone]     Current Medical History  Patient Admitting Diagnosis: Left PCA infarct  History of Present Illness:   [name] a 82 y.o.right handed femalewith unremarkable past medical history on no prescription medications. Presented 06/15/2017 was sudden onset of dizziness with right-sided weakness, slurred speech, blurred visionand fall. Cranial CT scan negative. Patient did receive TPA. CT cerebral perfusion scan as well as CT angiogram head and neck showed left P2 segment occlusion. 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 Echocardiogram with ejection fraction of 70% no wall motion abnormalities. Follow-up CT of the head 06/18/2017 showed some increased edema and mass effect associated with left PCA infarction and continue to monitor. Neurology follow-up and currently maintained on aspirin for CVA prophylaxis. Subcutaneous Lovenox for DVT prophylaxis. TEE showed ejection fraction 65%. Negative bubble study. No thrombus and loop recorder placement 06/20/2017. Maintained on  a regular diet.   Total: 1 NIHSS    Past Medical History  Past Medical History:  Diagnosis Date  . Stroke [facility])     Family History  family history includes Breast cancer in her brother and sister; Heart attack in her father.  Prior Rehab/Hospitalizations:  Has the patient had major surgery during 100 days prior to admission? No  Current Medications   Current Facility-Administered Medications:  .  acetaminophen (TYLENOL) tablet 650 mg, 650 mg, Oral, Q4H PRN **OR** [DISCONTINUED] acetaminophen (TYLENOL) solution 650 mg, 650 mg, Per Tube, Q4H PRN **OR** [DISCONTINUED] acetaminophen (TYLENOL) suppository 650 mg, 650 mg, Rectal, Q4H PRN, Caryl Pina, MD .  aspirin EC tablet 325 mg, 325 mg, Oral, Daily, Marvel Plan, MD, 325 mg at 06/19/17 2112 .  atorvastatin (LIPITOR) tablet 20 mg, 20 mg, Oral, q1800, Marvel Plan, MD, 20 mg at 06/19/17 1719 .  cyanocobalamin ((VITAMIN B-12)) injection 1,000 mcg, 1,000 mcg, Intramuscular, Daily, Costello, Mary A, NP .  enoxaparin (LOVENOX) injection 40 mg, 40 mg, Subcutaneous, Q24H, Marvel Plan, MD, 40 mg at 06/20/17 0738 .  labetalol (NORMODYNE,TRANDATE) injection 10 mg, 10 mg, Intravenous, Q2H PRN, Marvel Plan, MD .  pantoprazole (PROTONIX) EC tablet 40 mg, 40 mg, Oral, Daily, Marvel Plan, MD, 40 mg at 06/20/17  1039 .  senna-docusate (Senokot-S) tablet 1 tablet, 1 tablet, Oral, QHS PRN, Caryl Pina, MD  Patients Current Diet: Diet regular Room service appropriate? 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 None of the above  Prior Functional Level Prior Function Level of Independence: Independent  Self Care: Did the patient need help bathing, dressing, using the toilet or eating?  Independent  Indoor Mobility: Did the patient need assistance with walking from room to room (with or without device)? Independent  Stairs: Did the patient need assistance with internal or external stairs (with or without device)? Independent  Functional Cognition: Did the patient need help planning regular tasks such as shopping or remembering to take medications? Independent  Current Functional Level Cognition  Arousal/Alertness: Awake/alert Overall Cognitive Status: Impaired/Different from baseline Current Attention Level: Selective Orientation Level: Oriented to person, Oriented to place, Oriented to time, Oriented to situation Following Commands: Follows one step commands consistently Safety/Judgement: Decreased awareness of deficits, Decreased awareness of safety General Comments: pt able to recall room number, use room numbers to return to room. unable to recall deficits on arrival but end of session able to state balance and vision deficits impairing function Attention: Focused, Sustained Focused Attention: Appears intact Sustained Attention: Appears intact Memory: Impaired Memory Impairment: Decreased recall of new information Awareness: Impaired Awareness Impairment: Intellectual impairment, Emergent impairment, Anticipatory impairment Problem Solving: Impaired Problem Solving Impairment: Verbal basic, Functional basic Behaviors: Restless, Impulsive Safety/Judgment: Impaired    Extremity Assessment (includes Sensation/Coordination)  Upper Extremity Assessment: RUE deficits/detail RUE Deficits / Details: Grossly 3/5, impaired coordination during functional tasks, poor awareness of position in space RUE  Sensation: decreased light touch, decreased proprioception RUE Coordination: decreased fine motor, decreased gross motor  Lower Extremity Assessment: Defer to PT evaluation RLE Deficits / Details: pt with functional strength, not formally assessed with decreaesed proprioception and awareness of position RLE Sensation: decreased proprioception    ADLs  Overall ADL's : Needs assistance/impaired Grooming: Oral care, Set up, Supervision/safety, Sitting, Wash/dry face, Min guard, Standing, Brushing hair Grooming Details (indicate cue type and reason): Pt washing her hands and brushing hair at sink while standing with Min Guard A for safety and  Min VCs for locating items on R side. Pt performing oral care while seated in recliner with set up. Providing education on visual anchor technique to optimize independence during ADLs. Pt continues to present with decreased attention to R as well as R visual field cut.  Upper Body Bathing: Sitting, Moderate assistance Lower Body Bathing: Moderate assistance, Sit to/from stand Upper Body Dressing : Moderate assistance, Sitting Lower Body Dressing: Sit to/from stand, Minimal assistance Lower Body Dressing Details (indicate cue type and reason): Pt able to reach down and adjust socks with Min A for safety and sitting balance. Pt continues to demosntrate poor balance with signfiicant posterior lean Toilet Transfer: Moderate assistance, +2 for safety/equipment, Minimal assistance, Ambulation, Regular Toilet, RW Toilet Transfer Details (indicate cue type and reason): Pt requiring increased cues for sequencing and safety. Mod A for safety descent to toilet  and then Min A for sit>stand Toileting- Architect and Hygiene: Min guard, Sitting/lateral lean Toileting - Clothing Manipulation Details (indicate cue type and reason): Min Guard for safety during lateral leans Functional mobility during ADLs: Minimal assistance, +2 for safety/equipment, Rolling walker,  Cueing for sequencing, Cueing for safety(Cues to attending to R side) General ADL Comments: Pt demosntrating increased fucnitonal performance.However, contineus to be limited by decreased fucntional use of RUE, vision, and balance.     Mobility  Overal bed mobility: Needs Assistance Bed Mobility: Supine to Sit Supine to sit: Supervision General bed mobility comments: in chair on arrival    Transfers  Overall transfer level: Needs assistance Equipment used: Rolling walker (2 wheeled) Transfers: Sit to/from Stand Sit to Stand: Supervision General transfer comment: cues for hand placement and safety from bed and toilet with repeated trials x 4 to increase safety and controlled descent    Ambulation / Gait / Stairs / Wheelchair Mobility  Ambulation/Gait Ambulation/Gait assistance: Architect (Feet): 300 Feet Assistive device: Rolling walker (2 wheeled) Gait Pattern/deviations: Step-through pattern, Decreased stride length, Narrow base of support General Gait Details: pt with narrow BOS, slow gait but increased speed from last session. Pt utilizing head turns to look for room number to orient self and return to room. continues to have impaired ability to follow directional cues of right/left. Pt without posterior bias in static stance and with gait today Gait velocity interpretation: Below normal speed for age/gender    Posture / Balance Dynamic Sitting Balance Sitting balance - Comments: posterior lean with cues to return to midline and plant feet on the ground Static Standing Balance Single Leg Stance - Right Leg: 2 Single Leg Stance - Left Leg: 2 Tandem Stance - Right Leg: 10 Tandem Stance - Left Leg: 10 Rhomberg - Eyes Opened: 60 Rhomberg - Eyes Closed: 10 Balance Overall balance assessment: Needs assistance Sitting-balance support: Feet supported Sitting balance-Leahy Scale: Good Sitting balance - Comments: posterior lean with cues to return to midline and  plant feet on the ground Postural control: Posterior lean Standing balance support: Bilateral upper extremity supported Standing balance-Leahy Scale: Fair Standing balance comment: required bil UE support to maintain position in standing Single Leg Stance - Right Leg: 2 Single Leg Stance - Left Leg: 2 Tandem Stance - Right Leg: 10 Tandem Stance - Left Leg: 10 Rhomberg - Eyes Opened: 60 Rhomberg - Eyes Closed: 10 High Level Balance Comments: pt required assist to achieve tandem stance but was able to maintain after 2nd trial, pt able to look over bil shoulders without assist, 10 sec to turn 360 degrees with minguard assist, repeated trials in narrowed BOS with increased ability with repeated trials with pt inclination to reach for RW for support with all    Special needs/care consideration BiPAP/CPAP  N/a CPM  N/a Continuous Drip IV  N/a Dialysis  N/a Life Vest  N/a Oxygen  N/a Special Bed  N/a Trach Size  N/a Wound Vac n/a Skin ecchymosis BUE Bowel mgmt: continent LBM 06/17/17  Bladder mgmt:continent Diabetic mgmt  N/a   Previous Home Environment Living Arrangements: Alone  Lives With: Alone Available Help at Discharge: Available PRN/intermittently(great niece next door but works. sister prn) Type of Home: House Home Layout: Two level, Laundry or work area in basement, Able to live on main level with bedroom/bathroom Home Access: Stairs to enter Entrance Stairs-Rails: Right Entrance Stairs-Number of Steps: 4 to 5 Bathroom Shower/Tub: Health visitor: Standard Bathroom Accessibility: Yes How Accessible:  Accessible via walker Home Care Services: No  Discharge Living Setting Plans for Discharge Living Setting: Patient's home, Alone Type of Home at Discharge: House Discharge Home Layout: Laundry or work area in basement, Two level Discharge Home Access: Stairs to enter Entrance Stairs-Rails: Right Entrance Stairs-Number of Steps: 4 to 5 Discharge Bathroom  Shower/Tub: Walk-in shower Discharge Bathroom Toilet: Standard Discharge Bathroom Accessibility: Yes How Accessible: Accessible via walker Does the patient have any problems obtaining your medications?: No  Social/Family/Support Systems Patient Roles: Parent, 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) SolicitorContact Information: French Anaracy, daughter Anticipated Caregiver: family intermittently Anticipated Caregiver's Contact Information: see above Caregiver Availability: Intermittent Discharge Plan Discussed with Primary Caregiver: Yes Is Caregiver In Agreement with Plan?: Yes Does Caregiver/Family have Issues with Lodging/Transportation while Pt is in Rehab?: No  Goals/Additional Needs Patient/Family Goal for Rehab: Mod I to supervision with PT, OT, and SLP Expected length of stay: [facility] 10-15 days, but pt wanting to d/c asap Pt/Family Agrees to Admission and willing to participate: Yes Program Orientation Provided & Reviewed with Pt/Caregiver Including Roles  & Responsibilities: Yes  Decrease burden of Care through IP rehab admission: n/a  Possible need for SNF placement upon discharge: not anticipated  Patient Condition: This patient's medical and functional status has changed since the consult dated: 06/16/2017 in which the Rehabilitation Physician determined and documented that the patient's condition is appropriate for intensive rehabilitative care in an inpatient rehabilitation facility. See "History of Present Illness" (above) for medical update. Functional changes are: min assist. Patient's medical and functional status update has been discussed with the Rehabilitation physician and patient remains appropriate for inpatient rehabilitation. Will admit to inpatient rehab today.  Preadmission Screen Completed By:  Clois DupesBoyette, Terance Pomplun Godwin, 06/20/2017 11:02 AM ______________________________________________________________________   Discussed status with Dr. Riley KillSwartz on  06/20/2017 at  1103 and received telephone approval for admission today.  Admission Coordinator:  Clois DupesBoyette, Kerry Chisolm Godwin, time [id number] Date 06/20/2017

## 2017-06-19 NOTE — Progress Notes (Signed)
OT Treatment  Pt progressing towards established OT goals. Performing toilet transfer with Min-Mod A and +2 for safety. Pt continues to present with R inattention and required VCs during ADLs and functional mobility. Pt utilizing compensatory head turn throughout session. Providing pt and daughter with education on anchoring to increase independence with R sided inattention; pt and daughter verbalized understanding. Continues to recommend dc to CIR for further intensive therapy to optimize safety and independence with ADLs. Will continue to follow acutely as admitted.   06/19/17 1000  OT Visit Information  Last OT Received On 06/19/17  Assistance Needed +1  PT/OT/SLP Co-Evaluation/Treatment Yes  Reason for Co-Treatment Complexity of the patient's impairments (multi-system involvement)  OT goals addressed during session ADL's and self-care  History of Present Illness 82 yo admitted after episode of dizziness with fall with right weakness and vision loss. Pt with Left PCA infarct s/p tPA. No PMHx  Precautions  Precautions Fall  Precaution Comments right inattention, right hemianopsia  Pain Assessment  Pain Assessment No/denies pain  Cognition  Arousal/Alertness Awake/alert  Behavior During Therapy WFL for tasks assessed/performed  Overall Cognitive Status Impaired/Different from baseline  Area of Impairment Orientation;Attention;Memory;Following commands;Safety/judgement;Problem solving  Orientation Level Disoriented to;Time  Current Attention Level Sustained  Memory Decreased short-term memory  Following Commands Follows one step commands inconsistently;Follows one step commands with increased time  Safety/Judgement Decreased awareness of safety;Decreased awareness of deficits  Problem Solving Requires tactile cues;Requires verbal cues;Decreased initiation  General Comments Requiring increased time anc cues. Pt with poor problem solving and difficulty follow directions.   ADL  Overall  ADL's  Needs assistance/impaired  Grooming Oral care;Set up;Supervision/safety;Sitting;Wash/dry face;Min guard;Standing;Brushing hair  Grooming Details (indicate cue type and reason) Pt washing her hands and brushing hair at sink while standing with Min Guard A for safety and  Min VCs for locating items on R side. Pt performing oral care while seated in recliner with set up. Providing education on visual anchor technique to optimize independence during ADLs. Pt continues to present with decreased attention to R as well as R visual field cut.   Lower Body Dressing Sit to/from stand;Minimal assistance  Lower Body Dressing Details (indicate cue type and reason) Pt able to reach down and adjust socks with Min A for safety and sitting balance. Pt continues to demosntrate poor balance with signfiicant posterior lean  Toilet Transfer Moderate assistance;+2 for safety/equipment;Minimal assistance;Ambulation;Regular Toilet;RW  Toilet Transfer Details (indicate cue type and reason) Pt requiring increased cues for sequencing and safety. Mod A for safety descent to toilet and then Min A for sit>stand  Toileting- ArchitectClothing Manipulation and Hygiene Min guard;Sitting/lateral lean  Toileting - Clothing Manipulation Details (indicate cue type and reason) Min Guard for safety during lateral leans  Functional mobility during ADLs Minimal assistance;+2 for safety/equipment;Rolling walker;Cueing for sequencing;Cueing for safety (Cues to attending to R side)  General ADL Comments Pt demonstrating increased functional performance. However, continues to be limited by decreased fucntional use of RUE, vision, and balance.   Bed Mobility  Overal bed mobility Needs Assistance  Bed Mobility Supine to Sit  Supine to sit Supervision  General bed mobility comments supervision for lines and safety  Balance  Overall balance assessment Needs assistance  Sitting-balance support Feet supported  Sitting balance-Leahy Scale Fair   Sitting balance - Comments posterior lean with cues to return to midline and plant feet on the ground  Postural control Posterior lean  Standing balance support Bilateral upper extremity supported  Standing balance-Leahy Scale Poor  Standing balance comment required bil UE support to maintain position in standing  Restrictions  Weight Bearing Restrictions No  Vision- Assessment  Vision Assessment? Vision impaired- to be further tested in functional context  Additional Comments R inattention  Transfers  Overall transfer level Needs assistance  Equipment used Rolling walker (2 wheeled)  Transfers Sit to/from Stand  Sit to Stand Min assist  General transfer comment cues for hand placement, safety and sequence, pt with increased time and cues to attend to rail and armrest to her right  General Comments  General comments (skin integrity, edema, etc.) Daughter present throughout  session. Educated daughter on visual anchoring and to continue having pt use RUE  OT - End of Session  Equipment Utilized During Treatment Gait belt  Activity Tolerance Patient tolerated treatment well  Patient left in chair;with call bell/phone within reach;with chair alarm set;with family/visitor present  Nurse Communication Mobility status  OT Assessment/Plan  OT Plan Discharge plan remains appropriate;Frequency remains appropriate  OT Visit Diagnosis Unsteadiness on feet (R26.81);Other abnormalities of gait and mobility (R26.89);Hemiplegia and hemiparesis  Hemiplegia - Right/Left Right  Hemiplegia - dominant/non-dominant Dominant  Hemiplegia - caused by Cerebral infarction  OT Frequency (ACUTE ONLY) Min 3X/week  Follow Up Recommendations CIR;Supervision/Assistance - 24 hour  OT Equipment Other (comment) (TBD at next venue)  AM-PAC OT "6 Clicks" Daily Activity Outcome Measure  Help from another person eating meals? 3  Help from another person taking care of personal grooming? 3  Help from another person  toileting, which includes using toliet, bedpan, or urinal? 2  Help from another person bathing (including washing, rinsing, drying)? 2  Help from another person to put on and taking off regular upper body clothing? 2  Help from another person to put on and taking off regular lower body clothing? 2  6 Click Score 14  ADL G Code Conversion CK  OT Goal Progression  Progress towards OT goals Progressing toward goals  Acute Rehab OT Goals  Patient Stated Goal return home  OT Goal Formulation With patient  Time For Goal Achievement 06/30/17  Potential to Achieve Goals Good  ADL Goals  Pt Will Perform Toileting - Clothing Manipulation and hygiene with min guard assist;sit to/from stand  Pt Will Perform Grooming with min guard assist;standing  Pt Will Transfer to Toilet with min assist;ambulating;regular height toilet  Additional ADL Goal #1 Pt will demonstrate selective attention during ADL in a minimally distracting environment.  Additional ADL Goal #2 Pt will identify 3/5 items in R visual field with min cues.  OT Time Calculation  OT Start Time (ACUTE ONLY) 0829  OT Stop Time (ACUTE ONLY) 0907  OT Time Calculation (min) 38 min  OT General Charges  $OT Visit 1 Visit  OT Treatments  $Self Care/Home Management  8-22 mins   Glayds Insco MSOT, OTR/L Acute Rehab Pager: [phone] Office: [phone]

## 2017-06-19 NOTE — H&P (View-Only) (Signed)
ELECTROPHYSIOLOGY CONSULT NOTE  Patient ID: [name] MRN: [id number], DOB/AGE: 1936/03/18   Admit date: 06/15/2017 Date of Consult: 06/19/2017  Primary Physician: Patient, No Pcp Per Primary Cardiologist: none Reason for Consultation: Cryptogenic stroke -; recommendations regarding Implantable Loop Recorder requested by Dr. Lucia Gaskins  History of Present Illness [name] was admitted on 06/15/2017 with CVA.  The patient first developed symptoms while at home.  She apparently collapsed, though not reported to have had LOC, but to weak to get up despite help, slurred speech and R sided weakness was also noted, initially treated with tPA at [facility], ultimately transferred to [facility].  She remains with some R soded numbness and peripheral vision loss  She has no known PMHx until now.  Imaging demonstrated left PCA infarct embolic secondary to unclear source.  she has undergone workup for stroke including echocardiogram and carotid angio.  The patient has been monitored on telemetry which has demonstrated sinus rhythm with no arrhythmias.  Inpatient stroke work-up is to be completed with a TEE.   Echocardiogram this admission demonstrated  06/16/2017 Study Conclusions - Left ventricle: The cavity size was normal. Systolic function was vigorous. The estimated ejection fraction was in the range of 65% to 70%. Wall motion was normal; there were no regional wall motion abnormalities. - Aortic valve: Trileaflet; moderately thickened, moderately calcified leaflets. - Aorta: Aortic root diameter at sino-tubular junction: 42 mm (ED). - Aortic root: The aortic root was mildly dilated. - Mitral valve: Moderately thickened leaflet, anterior, 1.2cm in diameter. Consider TEE for further clarification (potential papillary fibroelastoma). - Right ventricle: The cavity size was mildly dilated. Wall thickness was normal. - Pulmonary arteries: Systolic pressure was mildly increased. PA peak  pressure: 35 mm Hg (S).   Lab work is reviewed.  Prior to admission, the patient denies chest pain, shortness of breath, dizziness, palpitations, or syncope.  She is recovering from their stroke with evaluation for CIR  at discharge.    Past Medical History:  Diagnosis Date  . Stroke [facility])      Surgical History: History reviewed. No pertinent surgical history.   No medications prior to admission.    Inpatient Medications:  . [MAR Hold] aspirin EC  325 mg Oral Daily  . [MAR Hold] atorvastatin  20 mg Oral q1800  . [MAR Hold] enoxaparin (LOVENOX) injection  40 mg Subcutaneous Q24H  . [MAR Hold] pantoprazole  40 mg Oral Daily    Allergies: No Known Allergies  Social History   Socioeconomic History  . Marital status: Married    Spouse name: Not on file  . Number of children: Not on file  . Years of education: Not on file  . Highest education level: Not on file  Social Needs  . Financial resource strain: Not on file  . Food insecurity - worry: Not on file  . Food insecurity - inability: Not on file  . Transportation needs - medical: Not on file  . Transportation needs - non-medical: Not on file  Occupational History  . Not on file  Tobacco Use  . Smoking status: Never Smoker  . Smokeless tobacco: Never Used  . Tobacco comment: None  Substance and Sexual Activity  . Alcohol use: No    Frequency: Never    Comment: None  . Drug use: No    Comment: None  . Sexual activity: No  Other Topics Concern  . Not on file  Social History Narrative  . Not on file  Family History  Problem Relation Age of Onset  . Heart attack Father   . Breast cancer Sister   . Breast cancer Brother       Review of Systems: All other systems reviewed and are otherwise negative except as noted above.  Physical Exam: Vitals:   06/19/17 1140 06/19/17 1150 06/19/17 1200 06/19/17 1210  BP:  135/90 139/84 139/82  Pulse: 70 68 69 68  Resp: 17 15 17 17   Temp:      TempSrc:        SpO2: 98% 98% 98% 97%  Weight:      Height:        GEN- The patient is well appearing, thin, alert and oriented x 3 today.   Head- normocephalic, atraumatic Eyes-  Sclera clear, conjunctiva pink Ears- hearing intact Oropharynx- clear Neck- supple Lungs- CTA b/l,  normal work of breathing Heart- RRR, no murmurs, rubs or gallops  GI- soft, NT, ND Extremities- no clubbing, cyanosis, or edema MS- no significant deformity, age appropriate atrophy Skin- no rash or lesion Psych- euthymic mood, full affect   Labs:   Lab Results  Component Value Date   WBC 5.9 06/18/2017   HGB 11.8 (L) 06/18/2017   HCT 35.5 (L) 06/18/2017   MCV 94.2 06/18/2017   PLT 146 (L) 06/18/2017    Recent Labs  Lab 06/15/17 1707  06/18/17 0314  NA 138   < > 134*  K 4.1   < > 3.7  CL 105   < > 104  CO2 22   < > 22  BUN 18   < > 13  CREATININE 0.76   < > 0.78  CALCIUM 9.3   < > 9.3  PROT 6.7  --   --   BILITOT 0.4  --   --   ALKPHOS 76  --   --   ALT 19  --   --   AST 33  --   --   GLUCOSE 127*   < > 101*   < > = values in this interval not displayed.   Lab Results  Component Value Date   TROPONINI <0.03 06/15/2017   Lab Results  Component Value Date   CHOL 213 (H) 06/16/2017   Lab Results  Component Value Date   HDL 80 06/16/2017   Lab Results  Component Value Date   LDLCALC 120 (H) 06/16/2017   Lab Results  Component Value Date   TRIG 65 06/16/2017   Lab Results  Component Value Date   CHOLHDL 2.7 06/16/2017   No results found for: LDLDIRECT  No results found for: DDIMER   Radiology/Studies:  Ct Angio Head W Or Wo Contrast Result Date: 06/15/2017 CLINICAL DATA:  Fall and right arm numbness with aphasia that resolved. Right homonymous hemianopia. EXAM: CT ANGIOGRAPHY HEAD AND NECK CT PERFUSION BRAIN TECHNIQUE: Multidetector CT imaging of the head and neck was performed using the standard protocol during bolus administration of intravenous contrast. Multiplanar CT image  reconstructions and MIPs were obtained to evaluate the vascular anatomy. Carotid stenosis measurements (when applicable) are obtained utilizing NASCET criteria, using the distal internal carotid diameter as the denominator. Multiphase CT imaging of the brain was performed following IV bolus contrast injection. Subsequent parametric perfusion maps were calculated using RAPID software. CONTRAST:  100mL ISOVUE-370 IOPAMIDOL (ISOVUE-370) INJECTION 76% COMPARISON:  Noncontrast head CT earlier today FINDINGS: CTA NECK FINDINGS Aortic arch: No acute finding.  Three vessel branching. Right carotid system: Mild atherosclerotic plaque at  the ICA bulb. No beading, stenosis, or ulceration. Left carotid system: Mild atherosclerotic plaque at the common carotid bifurcation and ICA bulb. No stenosis, ulceration, or beading. Vertebral arteries: No proximal subclavian stenosis. Mild atheromatous plaque at the vertebral origins. No stenosis or beading. Skeleton: No acute finding. Other neck: No incidental mass or inflammation. Upper chest: Negative Review of the MIP images confirms the above findings CTA HEAD FINDINGS Anterior circulation: No noted atheromatous changes. No branch occlusion or stenosis. Negative for beading or aneurysm. Posterior circulation: Vertebral and basilar arteries are smooth and widely patent. There is a left P2 segment occlusion or pre occlusion with flow gap. Venous sinuses: Patent Anatomic variants: None significant Delayed phase: No abnormal intracranial enhancement. Review of the MIP images confirms the above findings CT Brain Perfusion Findings: CBF (<30%) Volume: 0mL Perfusion (Tmax>6.0s) volume: 13mL These results were called by telephone at the time of interpretation on 06/15/2017 at 7:08 pm to Dr. Shaune Pollack. Patient has already been given tPA and is being transferred. IMPRESSION: 1. Left P2 segment occlusion with downstream reconstitution. No infarct by CTP but there is 13cc of measurable left occipital  lobe penumbra. 2. Very mild atheromatous changes for age. No stenosis or noted embolic souce. Electronically Signed   By: Marnee Spring M.D.   On: 06/15/2017 19:20    Ct Head Wo Contrast Result Date: 06/18/2017 CLINICAL DATA:  Continued surveillance LEFT PCA infarct, dizziness today. EXAM: CT HEAD WITHOUT CONTRAST TECHNIQUE: Contiguous axial images were obtained from the base of the skull through the vertex without intravenous contrast. COMPARISON:  Multiple priors.  Most recent CT 06/16/2017. FINDINGS: Brain: Increased edema and mass effect associated with the acute LEFT PCA territory infarction involving the medial and posterior temporal lobe and occipital lobes. Central heterogeneity suggests reperfusion microhemorrhage. No similar heterogeneity in the LEFT thalamus. No new areas of infarction. No midline shift. Vascular: No hyperdense vessel or unexpected calcification. Skull: Normal. Negative for fracture or focal lesion. Sinuses/Orbits: No acute finding. Other: None. IMPRESSION: Increased edema and mass effect associated with the LEFT PCA territory infarction in the occipital and temporal lobes, when compared with most recent prior CT 06/16/2017. Central heterogeneity suggests reperfusion microhemorrhage. If clinically indicated, MR is more sensitive in the detection of such. A call has been placed to the ordering provider. Electronically Signed   By: Elsie Stain M.D.   On: 06/18/2017 11:22    Ct Head Wo Contrast Result Date: 06/16/2017 CLINICAL DATA:  82 y/o  F; 24 hours tPA follow-up. EXAM: CT HEAD WITHOUT CONTRAST TECHNIQUE: Contiguous axial images were obtained from the base of the skull through the vertex without intravenous contrast. COMPARISON:  06/15/2016 CT head, CT perfusion head, CT angiogram head, and MRI head. FINDINGS: Brain: Stable distribution of left PCA infarction when compared with prior MRI diffusion signal abnormality. Interval increase in edema and local mass effect with  effacement of temporal horn of left lateral ventricle. No hemorrhage. No new area of infarction. No midline shift, effacement of basilar cisterns, or extra-axial collection. Vascular: No hyperdense vessel or unexpected calcification. Skull: Normal. Negative for fracture or focal lesion. Sinuses/Orbits: No acute finding. Other: Bilateral intra-ocular lens replacement. IMPRESSION: 1. Stable distribution of left PCA infarct. Interval increase in edema and local mass effect. No hemorrhage or herniation. 2. No new acute intracranial abnormality identified. Electronically Signed   By: Mitzi Hansen M.D.   On: 06/16/2017 17:53    Mr Brain Wo Contrast Result Date: 06/16/2017 CLINICAL DATA:  Follow-up examination for stroke.  Right-sided numbness. EXAM: MRI HEAD WITHOUT CONTRAST TECHNIQUE: Multiplanar, multiecho pulse sequences of the brain and surrounding structures were obtained without intravenous contrast. COMPARISON:  Prior CTs from 06/15/2016. FINDINGS: Brain: Study degraded by motion artifact. Cerebral volume normal. Minimal T2/FLAIR hyperintensity within the periventricular white matter, most like related chronic small vessel ischemic disease, felt to be within normal limits for age. Moderate-sized confluent area of restricted diffusion seen involving the parasagittal left temporal occipital region, consistent with acute left PCA territory infarct. Involvement of the left thalamus. No associated mass effect or hemorrhage. No other evidence for acute or subacute ischemia. Gray-white matter differentiation otherwise maintained. No mass lesion, midline shift or mass effect. No hydrocephalus. No extra-axial fluid collection. Pituitary gland suprasellar region normal. Midline structures intact and normal. Vascular: Major intracranial vascular flow voids maintained at the skullbase. Skull and upper cervical spine: Craniocervical junction normal. Upper cervical spine within normal limits. Bone marrow signal  intensity normal. No scalp soft tissue abnormality. Sinuses/Orbits: Globes normal soft tissues within normal limits. Patient status post cataract extraction bilaterally. Paranasal sinuses are clear. Trace right mastoid effusion. Inner ear structures normal. Other: None IMPRESSION: 1. Moderate sized acute ischemic nonhemorrhagic left PCA territory infarct. 2. Otherwise normal brain MRI. Electronically Signed   By: Rise Mu M.D.   On: 06/16/2017 00:49    12-lead ECG SR All prior EKG's in EPIC reviewed with no documented atrial fibrillation  Telemetry SR, very brief PAT only  Assessment and Plan:  1. Cryptogenic stroke The patient presents with cryptogenic stroke.  The patient has a TEE planned for this AM.  I spoke at length with the patient and her family about monitoring for afib with either a 30 day event monitor or an implantable loop recorder.  Risks, benefits, and alteratives to implantable loop recorder were discussed with the patient today.   At this time, the patient is very reluctant  To make a decision to proceed with implantable loop recorder vs event monitoring.   EP service remains available, please recall should the patient decide to pursue loop implant, this could be done day of discharge when ready and if patient wants to proceed. Please call with questions.   Sheilah Pigeon, PA-C 06/19/2017  I have seen and examined this patient with Francis Dowse.  Agree with above, note added to reflect my findings.  On exam, RRR, no murmurs, lungs clear. Presented with cryptogenic stroke. Plan for LINQ implant. Risks and benefits discussed. Risks include but not limited to bleeding, infection. The patient understands the risks and has agreed to the procedure. As she Adora Yeh be on telemetry at least overnight, would plan to implant LINQ the day of discharge. Please call EP back on the day of discharge.   Meridith Romick M. Addalie Calles MD 06/19/2017 2:44 PM

## 2017-06-19 NOTE — H&P (Signed)
Physical Medicine and Rehabilitation Admission H&P    Chief complaint: Weakness  HPI: [name] is a 82 y.o. right handed female with unremarkable past medical history on no prescription medications. Per chart review patient lives alone independent prior to admission. Two-level home with bedroom and bathroom on first floor.Family live in the area questioned assistance on discharge. Presented 06/15/2017 to [facility] with sudden onset of dizziness with right-sided weakness, slurred speech, blurred vision and fall. Cranial CT scan negative. Patient did receive TPA. CT cerebral perfusion scan as well as CT angiogram head and neck showed left P2 segment occlusion. 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 Echocardiogram with ejection fraction of 70% no wall motion abnormalities. Follow-up CT of the head 06/18/2017 showed some increased edema and mass effect associated with left PCA infarction and continue to monitor. Neurology follow-up and currently maintained on aspirin for CVA prophylaxis. Subcutaneous Lovenox for DVT prophylaxis. TEE showed ejection fraction 65%. Negative bubble study. No thrombus noted. Maintained on a regular diet. Physical and occupational therapy evaluation completed 06/16/2017 with recommendations of physical medicine rehabilitation consult. Patient was admitted for a comprehensive rehabilitation program    Review of Systems  Constitutional: Negative for chills and fever.  HENT: Negative for hearing loss.   Eyes: Positive for blurred vision.  Respiratory: Negative for cough and shortness of breath.   Cardiovascular: Negative for chest pain and leg swelling.  Gastrointestinal: Positive for constipation. Negative for heartburn, nausea and vomiting.  Genitourinary: Negative for dysuria, flank pain and hematuria.  Musculoskeletal: Positive for myalgias.  Skin: Negative for rash.  Neurological: Positive for  dizziness, sensory change, speech change and focal weakness.  All other systems reviewed and are negative.  Past Medical History:  Diagnosis Date  . Stroke [facility])    History reviewed. No pertinent surgical history. History reviewed. No pertinent family history. Social History:  reports that  has never smoked. she has never used smokeless tobacco. She reports that she does not drink alcohol or use drugs. Allergies: No Known Allergies No medications prior to admission.    Drug Regimen Review Drug regimen was reviewed and remains appropriate with no significant issues identified  Home: Home Living Family/patient expects to be discharged to:: Private residence Living Arrangements: Alone Available Help at Discharge: Family, Available 24 hours/day Type of Home: House Home Layout: Two level, Laundry or work area in basement, Able to live on main level with bedroom/bathroom Bathroom Shower/Tub: Multimedia programmer: Standard Home Equipment: None   Functional History: Prior Function Level of Independence: Independent  Functional Status:  Mobility: Bed Mobility Overal bed mobility: Needs Assistance Bed Mobility: Supine to Sit Supine to sit: Min guard, HOB elevated General bed mobility comments: Pt OOB in bathroom with PT upon arrival. Transfers Overall transfer level: Needs assistance Equipment used: 1 person hand held assist Transfers: Sit to/from Stand Sit to Stand: Min assist General transfer comment: min assist to stand from bed and toilet with cues for hand placement and safety Ambulation/Gait Ambulation/Gait assistance: Mod assist, +2 safety/equipment Ambulation Distance (Feet): 30 Feet Assistive device: 2 person hand held assist Gait Pattern/deviations: Step-through pattern, Decreased stride length, Leaning posteriorly, Narrow base of support General Gait Details: pt with narrow BOS with right posterior lean with very short shuffling steps, not attending to  right side with trunk rotated and partially side stepping to ambulate with cues for visual target, hand placement, sequence and safety Gait velocity interpretation: Below normal speed for age/gender  ADL: ADL Overall ADL's : Needs assistance/impaired Grooming: Moderate assistance, Sitting, Oral care Upper Body Bathing: Sitting, Moderate assistance Lower Body Bathing: Moderate assistance, Sit to/from stand Upper Body Dressing : Moderate assistance, Sitting Lower Body Dressing: Moderate assistance, Sit to/from stand Toilet Transfer: Moderate assistance, +2 for physical assistance, Ambulation, Comfort height toilet, Grab bars Toileting- Clothing Manipulation and Hygiene: Minimal assistance, Sit to/from stand Functional mobility during ADLs: Moderate assistance, +2 for physical assistance  Cognition: Cognition Overall Cognitive Status: Impaired/Different from baseline Arousal/Alertness: Awake/alert Orientation Level: Oriented to person, Oriented to place, Oriented to time, Oriented to situation(forgetful but remembers eventually) Attention: Focused, Sustained Focused Attention: Appears intact Sustained Attention: Appears intact Memory: Impaired Memory Impairment: Decreased recall of new information Awareness: Impaired Awareness Impairment: Intellectual impairment, Emergent impairment, Anticipatory impairment Problem Solving: Impaired Problem Solving Impairment: Verbal basic, Functional basic Behaviors: Restless, Impulsive Safety/Judgment: Impaired Cognition Arousal/Alertness: Awake/alert Behavior During Therapy: Impulsive Overall Cognitive Status: Impaired/Different from baseline Area of Impairment: Orientation, Attention, Memory, Following commands, Safety/judgement, Problem solving Orientation Level: Disoriented to, Time Current Attention Level: Sustained Memory: Decreased short-term memory Following Commands: Follows one step commands inconsistently, Follows one step commands  with increased time Safety/Judgement: Decreased awareness of safety, Decreased awareness of deficits Problem Solving: Requires tactile cues, Requires verbal cues, Decreased initiation  Physical Exam: Blood pressure (!) 152/72, pulse (!) 59, temperature 97.6 F (36.4 C), temperature source Oral, resp. rate 16, height _0  (1.702 m), weight 60.7 kg (133 lb 13.1 oz), SpO2 100 %. Physical Exam  Vitals reviewed. Constitutional: She appears well-developed.  HENT:  Head: Normocephalic.  Eyes: EOM are normal. Pupils are equal, round, and reactive to light. Right eye exhibits no discharge. Left eye exhibits no discharge.  Neck: Normal range of motion. Neck supple. No tracheal deviation present. No thyromegaly present.  Cardiovascular: Normal rate, regular rhythm and normal heart sounds. Exam reveals no friction rub.  No murmur heard. Respiratory: Effort normal. No respiratory distress. She has no wheezes. She has no rales.  GI: Soft. Bowel sounds are normal. She exhibits no distension.   Skin. Warm and dry Neurological:  Mood is flat but appropriate. Follows simple commands. Reasonable insight and awareness. Sometimes HOH.  Provides her name and age. Right HH noted. Continued right PD. RUE 4/5 prox to distal. RLE: 4/5 prox to distal. Decreased FTN, HTS  right upper and lower exts. Senses pain in all 4's Psych: pleasant and appropriate     Results for orders placed or performed during the hospital encounter of 06/15/17 (from the past 48 hour(s))  CBC     Status: Abnormal   Collection Time: 06/18/17  3:14 AM  Result Value Ref Range   WBC 5.9 4.0 - 10.5 K/uL   RBC 3.77 (L) 3.87 - 5.11 MIL/uL   Hemoglobin 11.8 (L) 12.0 - 15.0 g/dL   HCT 35.5 (L) 36.0 - 46.0 %   MCV 94.2 78.0 - 100.0 fL   MCH 31.3 26.0 - 34.0 pg   MCHC 33.2 30.0 - 36.0 g/dL   RDW 12.7 11.5 - 15.5 %   Platelets 146 (L) 150 - 400 K/uL  Basic metabolic panel     Status: Abnormal   Collection Time: 06/18/17  3:14 AM  Result  Value Ref Range   Sodium 134 (L) 135 - 145 mmol/L   Potassium 3.7 3.5 - 5.1 mmol/L   Chloride 104 101 - 111 mmol/L   CO2 22 22 - 32 mmol/L   Glucose, Bld 101 (H) 65 - 99 mg/dL  BUN 13 6 - 20 mg/dL   Creatinine, Ser 0.78 0.44 - 1.00 mg/dL   Calcium 9.3 8.9 - 10.3 mg/dL   GFR calc non Af Amer >60 >60 mL/min   GFR calc Af Amer >60 >60 mL/min    Comment: (NOTE) The eGFR has been calculated using the CKD EPI equation. This calculation has not been validated in all clinical situations. eGFR's persistently <60 mL/min signify possible Chronic Kidney Disease.    Anion gap 8 5 - 15   Ct Head Wo Contrast  Result Date: 06/18/2017 CLINICAL DATA:  Continued surveillance LEFT PCA infarct, dizziness today. EXAM: CT HEAD WITHOUT CONTRAST TECHNIQUE: Contiguous axial images were obtained from the base of the skull through the vertex without intravenous contrast. COMPARISON:  Multiple priors.  Most recent CT 06/16/2017. FINDINGS: Brain: Increased edema and mass effect associated with the acute LEFT PCA territory infarction involving the medial and posterior temporal lobe and occipital lobes. Central heterogeneity suggests reperfusion microhemorrhage. No similar heterogeneity in the LEFT thalamus. No new areas of infarction. No midline shift. Vascular: No hyperdense vessel or unexpected calcification. Skull: Normal. Negative for fracture or focal lesion. Sinuses/Orbits: No acute finding. Other: None. IMPRESSION: Increased edema and mass effect associated with the LEFT PCA territory infarction in the occipital and temporal lobes, when compared with most recent prior CT 06/16/2017. Central heterogeneity suggests reperfusion microhemorrhage. If clinically indicated, MR is more sensitive in the detection of such. A call has been placed to the ordering provider. Electronically Signed   By: Staci Righter M.D.   On: 06/18/2017 11:22       Medical Problem List and Plan: 1.  Right side weakness secondary to large PCA  infarction. Patient did receive TPA  -admit to inpatient rehab 2.  DVT Prophylaxis/Anticoagulation: Subcutaneous Lovenox. Monitor for any bleeding episodes 3. Pain Management: Tylenol as needed 4. Mood: Provide emotional support 5. Neuropsych: This patient is capable of making decisions on her own behalf. 6. Skin/Wound Care: Routine skin checks 7. Fluids/Electrolytes/Nutrition: Routine I&O's with follow-up chemistries 8. Hyperlipidemia. Lipitor     Post Admission Physician Evaluation: 1. Functional deficits secondary  to left PCA infarct. 2. Patient is admitted to receive collaborative, interdisciplinary care between the physiatrist, rehab nursing staff, and therapy team. 3. Patient's level of medical complexity and substantial therapy needs in context of that medical necessity cannot be provided at a lesser intensity of care such as a SNF. 4. Patient has experienced substantial functional loss from his/her baseline which was documented above under the "Functional History" and "Functional Status" headings.  Judging by the patient's diagnosis, physical exam, and functional history, the patient has potential for functional progress which will result in measurable gains while on inpatient rehab.  These gains will be of substantial and practical use upon discharge  in facilitating mobility and self-care at the household level. 5. Physiatrist will provide 24 hour management of medical needs as well as oversight of the therapy plan/treatment and provide guidance as appropriate regarding the interaction of the two. 6. The Preadmission Screening has been reviewed and patient status is unchanged unless otherwise stated above. 7. 24 hour rehab nursing will assist with bladder management, bowel management, safety, skin/wound care, disease management, medication administration, pain management and patient education  and help integrate therapy concepts, techniques,education, etc. 8. PT will assess and treat  for/with: Lower extremity strength, range of motion, stamina, balance, functional mobility, safety, adaptive techniques and equipment, NMR, family ed.   Goals are: mod I goals. 9. OT  will assess and treat for/with: ADL's, functional mobility, safety, upper extremity strength, adaptive techniques and equipment, NMR, family education.   Goals are: mod I. Therapy may proceed with showering this patient. 10. SLP will assess and treat for/with: cognition, communication, family ed.  Goals are: mod I. 11. Case Management and Social Worker will assess and treat for psychological issues and discharge planning. 12. Team conference will be held weekly to assess progress toward goals and to determine barriers to discharge. 13. Patient will receive at least 3 hours of therapy per day at least 5 days per week. 14. ELOS: 10-15 days       15. Prognosis:  excellent     Meredith Staggers, MD, [facility] 06/20/2017  Lavon Paganini [place], PA-C 06/19/2017

## 2017-06-19 NOTE — Progress Notes (Signed)
I met with pt at bedside with her sister, niece and great niece. We discussed goals an expectations of an inpt rehab admit. Pt initially did not want admit, rather d/c home. But after further discussion, pt is in agreement. I am hopeful for a bed available tomorrow. [phone]

## 2017-06-19 NOTE — Consult Note (Addendum)
ELECTROPHYSIOLOGY CONSULT NOTE  Patient ID: [name] MRN: [id number], DOB/AGE: 1936/03/18   Admit date: 06/15/2017 Date of Consult: 06/19/2017  Primary Physician: Patient, No Pcp Per Primary Cardiologist: none Reason for Consultation: Cryptogenic stroke -; recommendations regarding Implantable Loop Recorder requested by Dr. Lucia Gaskins  History of Present Illness [name] was admitted on 06/15/2017 with CVA.  The patient first developed symptoms while at home.  She apparently collapsed, though not reported to have had LOC, but to weak to get up despite help, slurred speech and R sided weakness was also noted, initially treated with tPA at [facility] - , ultimately transferred to [facility].  She remains with some R soded numbness and peripheral vision loss  She has no known PMHx until now.  Imaging demonstrated left PCA infarct embolic secondary to unclear source.  she has undergone workup for stroke including echocardiogram and carotid angio.  The patient has been monitored on telemetry which has demonstrated sinus rhythm with no arrhythmias.  Inpatient stroke work-up is to be completed with a TEE.   Echocardiogram this admission demonstrated  06/16/2017 Study Conclusions - Left ventricle: The cavity size was normal. Systolic function was vigorous. The estimated ejection fraction was in the range of 65% to 70%. Wall motion was normal; there were no regional wall motion abnormalities. - Aortic valve: Trileaflet; moderately thickened, moderately calcified leaflets. - Aorta: Aortic root diameter at sino-tubular junction: 42 mm (ED). - Aortic root: The aortic root was mildly dilated. - Mitral valve: Moderately thickened leaflet, anterior, 1.2cm in diameter. Consider TEE for further clarification (potential papillary fibroelastoma). - Right ventricle: The cavity size was mildly dilated. Wall thickness was normal. - Pulmonary arteries: Systolic pressure was mildly increased. PA peak  pressure: 35 mm Hg (S).   Lab work is reviewed.  Prior to admission, the patient denies chest pain, shortness of breath, dizziness, palpitations, or syncope.  She is recovering from their stroke with evaluation for CIR  at discharge.    Past Medical History:  Diagnosis Date  . Stroke [facility])      Surgical History: History reviewed. No pertinent surgical history.   No medications prior to admission.    Inpatient Medications:  . [MAR Hold] aspirin EC  325 mg Oral Daily  . [MAR Hold] atorvastatin  20 mg Oral q1800  . [MAR Hold] enoxaparin (LOVENOX) injection  40 mg Subcutaneous Q24H  . [MAR Hold] pantoprazole  40 mg Oral Daily    Allergies: No Known Allergies  Social History   Socioeconomic History  . Marital status: Married    Spouse name: Not on file  . Number of children: Not on file  . Years of education: Not on file  . Highest education level: Not on file  Social Needs  . Financial resource strain: Not on file  . Food insecurity - worry: Not on file  . Food insecurity - inability: Not on file  . Transportation needs - medical: Not on file  . Transportation needs - non-medical: Not on file  Occupational History  . Not on file  Tobacco Use  . Smoking status: Never Smoker  . Smokeless tobacco: Never Used  . Tobacco comment: None  Substance and Sexual Activity  . Alcohol use: No    Frequency: Never    Comment: None  . Drug use: No    Comment: None  . Sexual activity: No  Other Topics Concern  . Not on file  Social History Narrative  . Not on file  Family History  Problem Relation Age of Onset  . Heart attack Father   . Breast cancer Sister   . Breast cancer Brother       Review of Systems: All other systems reviewed and are otherwise negative except as noted above.  Physical Exam: Vitals:   06/19/17 1140 06/19/17 1150 06/19/17 1200 06/19/17 1210  BP:  135/90 139/84 139/82  Pulse: 70 68 69 68  Resp: 17 15 17 17   Temp:      TempSrc:        SpO2: 98% 98% 98% 97%  Weight:      Height:        GEN- The patient is well appearing, thin, alert and oriented x 3 today.   Head- normocephalic, atraumatic Eyes-  Sclera clear, conjunctiva pink Ears- hearing intact Oropharynx- clear Neck- supple Lungs- CTA b/l,  normal work of breathing Heart- RRR, no murmurs, rubs or gallops  GI- soft, NT, ND Extremities- no clubbing, cyanosis, or edema MS- no significant deformity, age appropriate atrophy Skin- no rash or lesion Psych- euthymic mood, full affect   Labs:   Lab Results  Component Value Date   WBC 5.9 06/18/2017   HGB 11.8 (L) 06/18/2017   HCT 35.5 (L) 06/18/2017   MCV 94.2 06/18/2017   PLT 146 (L) 06/18/2017    Recent Labs  Lab 06/15/17 1707  06/18/17 0314  NA 138   < > 134*  K 4.1   < > 3.7  CL 105   < > 104  CO2 22   < > 22  BUN 18   < > 13  CREATININE 0.76   < > 0.78  CALCIUM 9.3   < > 9.3  PROT 6.7  --   --   BILITOT 0.4  --   --   ALKPHOS 76  --   --   ALT 19  --   --   AST 33  --   --   GLUCOSE 127*   < > 101*   < > = values in this interval not displayed.   Lab Results  Component Value Date   TROPONINI <0.03 06/15/2017   Lab Results  Component Value Date   CHOL 213 (H) 06/16/2017   Lab Results  Component Value Date   HDL 80 06/16/2017   Lab Results  Component Value Date   LDLCALC 120 (H) 06/16/2017   Lab Results  Component Value Date   TRIG 65 06/16/2017   Lab Results  Component Value Date   CHOLHDL 2.7 06/16/2017   No results found for: LDLDIRECT  No results found for: DDIMER   Radiology/Studies:  Ct Angio Head W Or Wo Contrast Result Date: 06/15/2017 CLINICAL DATA:  Fall and right arm numbness with aphasia that resolved. Right homonymous hemianopia. EXAM: CT ANGIOGRAPHY HEAD AND NECK CT PERFUSION BRAIN TECHNIQUE: Multidetector CT imaging of the head and neck was performed using the standard protocol during bolus administration of intravenous contrast. Multiplanar CT image  reconstructions and MIPs were obtained to evaluate the vascular anatomy. Carotid stenosis measurements (when applicable) are obtained utilizing NASCET criteria, using the distal internal carotid diameter as the denominator. Multiphase CT imaging of the brain was performed following IV bolus contrast injection. Subsequent parametric perfusion maps were calculated using RAPID software. CONTRAST:  100mL ISOVUE-370 IOPAMIDOL (ISOVUE-370) INJECTION 76% COMPARISON:  Noncontrast head CT earlier today FINDINGS: CTA NECK FINDINGS Aortic arch: No acute finding.  Three vessel branching. Right carotid system: Mild atherosclerotic plaque at  the ICA bulb. No beading, stenosis, or ulceration. Left carotid system: Mild atherosclerotic plaque at the common carotid bifurcation and ICA bulb. No stenosis, ulceration, or beading. Vertebral arteries: No proximal subclavian stenosis. Mild atheromatous plaque at the vertebral origins. No stenosis or beading. Skeleton: No acute finding. Other neck: No incidental mass or inflammation. Upper chest: Negative Review of the MIP images confirms the above findings CTA HEAD FINDINGS Anterior circulation: No noted atheromatous changes. No branch occlusion or stenosis. Negative for beading or aneurysm. Posterior circulation: Vertebral and basilar arteries are smooth and widely patent. There is a left P2 segment occlusion or pre occlusion with flow gap. Venous sinuses: Patent Anatomic variants: None significant Delayed phase: No abnormal intracranial enhancement. Review of the MIP images confirms the above findings CT Brain Perfusion Findings: CBF (<30%) Volume: 0mL Perfusion (Tmax>6.0s) volume: 13mL These results were called by telephone at the time of interpretation on 06/15/2017 at 7:08 pm to Dr. Shaune Pollack. Patient has already been given tPA and is being transferred. IMPRESSION: 1. Left P2 segment occlusion with downstream reconstitution. No infarct by CTP but there is 13cc of measurable left occipital  lobe penumbra. 2. Very mild atheromatous changes for age. No stenosis or noted embolic souce. Electronically Signed   By: Marnee Spring M.D.   On: 06/15/2017 19:20    Ct Head Wo Contrast Result Date: 06/18/2017 CLINICAL DATA:  Continued surveillance LEFT PCA infarct, dizziness today. EXAM: CT HEAD WITHOUT CONTRAST TECHNIQUE: Contiguous axial images were obtained from the base of the skull through the vertex without intravenous contrast. COMPARISON:  Multiple priors.  Most recent CT 06/16/2017. FINDINGS: Brain: Increased edema and mass effect associated with the acute LEFT PCA territory infarction involving the medial and posterior temporal lobe and occipital lobes. Central heterogeneity suggests reperfusion microhemorrhage. No similar heterogeneity in the LEFT thalamus. No new areas of infarction. No midline shift. Vascular: No hyperdense vessel or unexpected calcification. Skull: Normal. Negative for fracture or focal lesion. Sinuses/Orbits: No acute finding. Other: None. IMPRESSION: Increased edema and mass effect associated with the LEFT PCA territory infarction in the occipital and temporal lobes, when compared with most recent prior CT 06/16/2017. Central heterogeneity suggests reperfusion microhemorrhage. If clinically indicated, MR is more sensitive in the detection of such. A call has been placed to the ordering provider. Electronically Signed   By: Elsie Stain M.D.   On: 06/18/2017 11:22    Ct Head Wo Contrast Result Date: 06/16/2017 CLINICAL DATA:  82 y/o  F; 24 hours tPA follow-up. EXAM: CT HEAD WITHOUT CONTRAST TECHNIQUE: Contiguous axial images were obtained from the base of the skull through the vertex without intravenous contrast. COMPARISON:  06/15/2016 CT head, CT perfusion head, CT angiogram head, and MRI head. FINDINGS: Brain: Stable distribution of left PCA infarction when compared with prior MRI diffusion signal abnormality. Interval increase in edema and local mass effect with  effacement of temporal horn of left lateral ventricle. No hemorrhage. No new area of infarction. No midline shift, effacement of basilar cisterns, or extra-axial collection. Vascular: No hyperdense vessel or unexpected calcification. Skull: Normal. Negative for fracture or focal lesion. Sinuses/Orbits: No acute finding. Other: Bilateral intra-ocular lens replacement. IMPRESSION: 1. Stable distribution of left PCA infarct. Interval increase in edema and local mass effect. No hemorrhage or herniation. 2. No new acute intracranial abnormality identified. Electronically Signed   By: Mitzi Hansen M.D.   On: 06/16/2017 17:53    Mr Brain Wo Contrast Result Date: 06/16/2017 CLINICAL DATA:  Follow-up examination for stroke.  Right-sided numbness. EXAM: MRI HEAD WITHOUT CONTRAST TECHNIQUE: Multiplanar, multiecho pulse sequences of the brain and surrounding structures were obtained without intravenous contrast. COMPARISON:  Prior CTs from 06/15/2016. FINDINGS: Brain: Study degraded by motion artifact. Cerebral volume normal. Minimal T2/FLAIR hyperintensity within the periventricular white matter, most like related chronic small vessel ischemic disease, felt to be within normal limits for age. Moderate-sized confluent area of restricted diffusion seen involving the parasagittal left temporal occipital region, consistent with acute left PCA territory infarct. Involvement of the left thalamus. No associated mass effect or hemorrhage. No other evidence for acute or subacute ischemia. Gray-white matter differentiation otherwise maintained. No mass lesion, midline shift or mass effect. No hydrocephalus. No extra-axial fluid collection. Pituitary gland suprasellar region normal. Midline structures intact and normal. Vascular: Major intracranial vascular flow voids maintained at the skullbase. Skull and upper cervical spine: Craniocervical junction normal. Upper cervical spine within normal limits. Bone marrow signal  intensity normal. No scalp soft tissue abnormality. Sinuses/Orbits: Globes normal soft tissues within normal limits. Patient status post cataract extraction bilaterally. Paranasal sinuses are clear. Trace right mastoid effusion. Inner ear structures normal. Other: None IMPRESSION: 1. Moderate sized acute ischemic nonhemorrhagic left PCA territory infarct. 2. Otherwise normal brain MRI. Electronically Signed   By: Rise Mu M.D.   On: 06/16/2017 00:49    12-lead ECG SR All prior EKG's in EPIC reviewed with no documented atrial fibrillation  Telemetry SR, very brief PAT only  Assessment and Plan:  1. Cryptogenic stroke The patient presents with cryptogenic stroke.  The patient has a TEE planned for this AM.  I spoke at length with the patient and her family about monitoring for afib with either a 30 day event monitor or an implantable loop recorder.  Risks, benefits, and alteratives to implantable loop recorder were discussed with the patient today.   At this time, the patient is very reluctant  To make a decision to proceed with implantable loop recorder vs event monitoring.   EP service remains available, please recall should the patient decide to pursue loop implant, this could be done day of discharge when ready and if patient wants to proceed. Please call with questions.   Sheilah Pigeon, PA-C 06/19/2017  I have seen and examined this patient with Francis Dowse.  Agree with above, note added to reflect my findings.  On exam, RRR, no murmurs, lungs clear. Presented with cryptogenic stroke. Plan for LINQ implant. Risks and benefits discussed. Risks include but not limited to bleeding, infection. The patient understands the risks and has agreed to the procedure. As she Emeline Simpson be on telemetry at least overnight, would plan to implant LINQ the day of discharge. Please call EP back on the day of discharge.   Arvin Abello M. Rawson Minix MD 06/19/2017 2:44 PM

## 2017-06-19 NOTE — Progress Notes (Signed)
STROKE TEAM PROGRESS NOTE    HISTORY PER H&P 06/15/17 [name] is an 82 y.o. female who was in her USOH on Thursday when she suddenly felt dizzy and had a "controlled fall" at 1545. She was unable to get up by herself and family noted her to be weak on the right. She was complaining of right arm numbness and dizziness. EMS transported the patient to [facility]. On arrival, she had garbled speech and right peripheral vision loss. CT head was negative for ICH. She was diagnosed with an acute stroke and tPA infusion started. She had CTA prior to transfer to [facility]. On arrival to [facility], the patient still had numbness and peripheral vision loss.  CTA head and neck with CTP:  1. Left P2 segment occlusion with downstream reconstitution. No infarct by CTP but there is 13cc of measurable left occipital lobe penumbra. 2. Very mild atheromatous changes for age. No stenosis or noted embolic souce.  LSN: [id number] tPA Given: Yes. Administered at [facility]    SUBJECTIVE (INTERVAL HISTORY) She feels dizzier today and blurrier. Her right thumb feels numb but this is not new. CT head should increased edema, will move to Neuro-ICU to monitor more closely, discussed with family   OBJECTIVE Temp:  [97.6 F (36.4 C)-98.5 F (36.9 C)] 98 F (36.7 C) (01/07 1139) Pulse Rate:  [58-85] 77 (01/07 1600) Cardiac Rhythm: Normal sinus rhythm (01/07 0800) Resp:  [12-24] 20 (01/07 1600) BP: (117-183)/(59-111) 133/77 (01/07 1600) SpO2:  [93 %-100 %] 97 % (01/07 1600)  Recent Labs  Lab 06/15/17 1719  GLUCAP 115*   Recent Labs  Lab 06/15/17 1707 06/16/17 0802 06/17/17 0413 06/18/17 0314  NA 138 138 140 134*  K 4.1 3.9 3.6 3.7  CL 105 108 108 104  CO2 22 23 24 22   GLUCOSE 127* 110* 104* 101*  BUN 18 10 8 13   CREATININE 0.76 0.72 0.73 0.78  CALCIUM 9.3 9.1 9.0 9.3   Recent Labs  Lab 06/15/17 1707  AST 33  ALT 19  ALKPHOS 76  BILITOT 0.4  PROT 6.7  ALBUMIN 4.1   Recent Labs  Lab 06/15/17 1707  06/16/17 0802 06/17/17 0413 06/18/17 0314  WBC 4.4 7.9 5.8 5.9  NEUTROABS 1.9  --   --   --   HGB 12.8 11.9* 11.8* 11.8*  HCT 37.0 35.8* 34.7* 35.5*  MCV 94.7 94.5 95.3 94.2  PLT 145* 130* 127* 146*   Recent Labs  Lab 06/15/17 1707  TROPONINI <0.03   No results for input(s): LABPROT, INR in the last 72 hours. No results for input(s): COLORURINE, LABSPEC, PHURINE, GLUCOSEU, HGBUR, BILIRUBINUR, KETONESUR, PROTEINUR, UROBILINOGEN, NITRITE, LEUKOCYTESUR in the last 72 hours.  Invalid input(s): APPERANCEUR     Component Value Date/Time   CHOL 213 (H) 06/16/2017 0440   TRIG 65 06/16/2017 0440   HDL 80 06/16/2017 0440   CHOLHDL 2.7 06/16/2017 0440   VLDL 13 06/16/2017 0440   LDLCALC 120 (H) 06/16/2017 0440   Lab Results  Component Value Date   HGBA1C 5.2 06/16/2017   No results found for: LABOPIA, COCAINSCRNUR, LABBENZ, AMPHETMU, THCU, LABBARB  Recent Labs  Lab 06/15/17 1707  ETH <10    IMAGING  I have personally reviewed the radiological images below and agree with the radiology interpretations.   Ct Angio Head W Or Wo Contrast Ct Angio Neck W Or Wo Contrast Ct Cerebral Perfusion W Contrast 06/15/2017 IMPRESSION:  1. Left P2 segment occlusion with downstream reconstitution. No infarct by CTP  but there is 13cc of measurable left occipital lobe penumbra.  2. Very mild atheromatous changes for age. No stenosis or noted embolic souce.     Ct Head Wo Contrast 06/16/2017 IMPRESSION:  1. Stable distribution of left PCA infarct. Interval increase in edema and local mass effect. No hemorrhage or herniation.  2. No new acute intracranial abnormality identified.    Ct Head Wo Contrast 06/15/2017 IMPRESSION:  Negative head CT.   CT Head 06/18/2017  Increased edema and mass effect associated with the LEFT PCA territory infarction in the occipital and temporal lobes, when compared with most recent prior CT 06/16/2017.  Central heterogeneity suggests reperfusion  microhemorrhage. If clinically indicated, MR is more sensitive in the detection of such.    Mr Brain Wo Contrast 06/16/2017 IMPRESSION:  1. Moderate sized acute ischemic nonhemorrhagic left PCA territory infarct.  2. Otherwise normal brain MRI.    TTE  06/16/2017 Study Conclusions - Left ventricle: The cavity size was normal. Systolic function was   vigorous. The estimated ejection fraction was in the range of 65%   to 70%. Wall motion was normal; there were no regional wall   motion abnormalities. - Aortic valve: Trileaflet; moderately thickened, moderately   calcified leaflets. - Aorta: Aortic root diameter at sino-tubular junction: 42 mm (ED). - Aortic root: The aortic root was mildly dilated. - Mitral valve: Moderately thickened leaflet, anterior, 1.2cm in   diameter. Consider TEE for further clarification (potential   papillary fibroelastoma). - Right ventricle: The cavity size was mildly dilated. Wall   thickness was normal. - Pulmonary arteries: Systolic pressure was mildly increased. PA   peak pressure: 35 mm Hg (S).   PHYSICAL EXAM  Temp:  [97.6 F (36.4 C)-98.5 F (36.9 C)] 98 F (36.7 C) (01/07 1139) Pulse Rate:  [58-85] 77 (01/07 1600) Resp:  [12-24] 20 (01/07 1600) BP: (117-183)/(59-111) 133/77 (01/07 1600) SpO2:  [93 %-100 %] 97 % (01/07 1600)  General - Well nourished, well developed, in no apparent distress.  Ophthalmologic - fundi not visualized due to noncooperation.  Cardiovascular - Regular rate and rhythm with no murmur.  Mental Status -  Level of arousal and orientation to time, place, and person were intact. Language including expression, naming, repetition, comprehension was assessed and found intact. Fund of Knowledge was assessed and was intact.  Cranial Nerves II - XII - II - right hemianopia. III, IV, VI - Extraocular movements intact. V - Facial sensation intact bilaterally. VII - Facial movement intact bilaterally. VIII - Hearing &  vestibular intact bilaterally. X - Palate elevates symmetrically. XI - Chin turning & shoulder shrug intact bilaterally. XII - Tongue protrusion intact.  Motor Strength - The patient's strength was normal in all extremities and pronator drift was absent.  Bulk was normal and fasciculations were absent.   Motor Tone - Muscle tone was assessed at the neck and appendages and was normal.  Reflexes - The patient's reflexes were symmetrical in all extremities and she had no pathological reflexes.  Sensory - Light touch, temperature/pinprick were assessed and were diminished on the RLE, but also has b/l sensory neglect.    Coordination - The patient had right UE and LE mild ataxia.  Tremor was absent.  Gait and Station - deferred.   ASSESSMENT/PLAN Ms. [name] is a 82 y.o. female with history of hearing impairement admitted for right sided weakness, numbness, slurry speech and right hemianopia. TPA given.  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  Resultant right hemianopia, right sensory neglect, right ataxia  MRI  Left PCA infarct including left occipital and left thalamus  CT head shows increased edema, transfer to NeuroICU for closer monitoring  CTA head and neck left P2/P3 occlusion  CTP - no core, only minimal left occipital penumbra  2D Echo - TEE for further clarification (potential papillary fibroelastoma).  Recommend TEE/loop to rule out cardioembolic source  LDL 120  HgbA1c 5.2  SCDs for VTE prophylaxis  Diet regular Room service appropriate? Yes; Fluid consistency: Thin   No antithrombotic prior to admission, now on No antithrombotic within 24h of tPA  Ongoing aggressive stroke risk factor management  Therapy recommendations:  CIR recommended. Dr Riley KillSwartz has see patient.  Disposition:  pending  Hyperlipidemia  Home meds:  none   LDL 120, goal < 70  Now on Lipitor 20 mg daily  Continue statin at discharge  Other Stroke Risk  Factors  Advanced age  Other Active Problems  Family report mild cognitive impairment over time   Assessment Plan  TEE and loop Monday  CT Head:shows  Increased edema and mass effect associated with the LEFT PCA territory infarction in the occipital and temporal lobes, moved to neuroicu for closer monitoring  Hospital day # 4   I have personally examined this patient, reviewed notes, independently viewed imaging studies, participated in medical decision making and plan of care.ROS completed by me personally and pertinent positives fully documented  I have made any additions or clarifications directly to the above note.  Check TEE today. Transfer to neurology floor bed later today. Long discussion with patient and family members at the bedside and answered questions. Discussed with Dr. Anne FuSkains Greater than 50% time during this 35 minute visit was spent on counseling and coordination of care: about her embolic stroke and answered questions Delia HeadyPramod Sethi, MD Medical Director [facility] Pager: [phone] 06/19/2017 4:32 PM    To contact Stroke Continuity provider, please refer to [email or web address]. After hours, contact General Neurology

## 2017-06-19 NOTE — CV Procedure (Signed)
   Transesophageal echocardiogram  Indications: Stroke  Timeout performed:During this procedure the patient is administered a total of Versed 2 mg and Fentanyl 25 mcg to achieve and maintain moderate conscious sedation.  The patient's heart rate, blood pressure, and oxygen saturation are monitored continuously during the procedure. The period of conscious sedation is 25 minutes, of which I was present face-to-face 100% of this time.  Findings: - Normal ejection fraction 65% - Mildly thickened mitral valve with trace to mild mitral regurgitation, mild anterior leaf prolapse.  There is no evidence of fibroblastoma -Negative bubble study with no shunt. -No left atrial appendage thrombus. -No embolic cardiac source identified.  Donato SchultzMark Briauna Gilmartin, MD

## 2017-06-19 NOTE — Progress Notes (Signed)
SLP Cancellation Note  Patient Details Name: Angela SheererReva R Gilbert MRN: [id number] DOB: 10/01/1935   Cancelled treatment:       Reason Eval/Treat Not Completed: Patient at procedure or test/unavailable . Procedure planned for today, pt NPO. Will f/u    Jaimya Feliciano, Riley NearingBonnie Caroline 06/19/2017, 7:47 AM

## 2017-06-20 ENCOUNTER — Encounter (HOSPITAL_COMMUNITY): Payer: Self-pay | Admitting: Cardiology

## 2017-06-20 ENCOUNTER — Inpatient Hospital Stay (HOSPITAL_COMMUNITY)
Admission: RE | Admit: 2017-06-20 | Discharge: 2017-06-28 | DRG: 057 | Disposition: A | Discharge status: Home / Self Care | Payer: Medicare Other | Source: Intra-hospital | Attending: Physical Medicine & Rehabilitation | Admitting: Physical Medicine & Rehabilitation

## 2017-06-20 ENCOUNTER — Encounter (HOSPITAL_COMMUNITY)
Admission: EM | Disposition: A | Discharge status: Rehabilitation Facility | Payer: Self-pay | Source: Home / Self Care | Attending: Neurology

## 2017-06-20 DIAGNOSIS — E785 Hyperlipidemia, unspecified: Secondary | ICD-10-CM | POA: Diagnosis present

## 2017-06-20 DIAGNOSIS — I69390 Apraxia following cerebral infarction: Secondary | ICD-10-CM | POA: Diagnosis not present

## 2017-06-20 DIAGNOSIS — I63532 Cerebral infarction due to unspecified occlusion or stenosis of left posterior cerebral artery: Secondary | ICD-10-CM

## 2017-06-20 DIAGNOSIS — I69320 Aphasia following cerebral infarction: Secondary | ICD-10-CM | POA: Diagnosis not present

## 2017-06-20 DIAGNOSIS — I69351 Hemiplegia and hemiparesis following cerebral infarction affecting right dominant side: Principal | ICD-10-CM

## 2017-06-20 DIAGNOSIS — I69319 Unspecified symptoms and signs involving cognitive functions following cerebral infarction: Secondary | ICD-10-CM

## 2017-06-20 DIAGNOSIS — R269 Unspecified abnormalities of gait and mobility: Secondary | ICD-10-CM | POA: Diagnosis not present

## 2017-06-20 DIAGNOSIS — R531 Weakness: Secondary | ICD-10-CM | POA: Diagnosis present

## 2017-06-20 DIAGNOSIS — R739 Hyperglycemia, unspecified: Secondary | ICD-10-CM

## 2017-06-20 DIAGNOSIS — I10 Essential (primary) hypertension: Secondary | ICD-10-CM

## 2017-06-20 DIAGNOSIS — I6329 Cerebral infarction due to unspecified occlusion or stenosis of other precerebral arteries: Secondary | ICD-10-CM | POA: Diagnosis present

## 2017-06-20 DIAGNOSIS — K5900 Constipation, unspecified: Secondary | ICD-10-CM | POA: Diagnosis present

## 2017-06-20 DIAGNOSIS — K219 Gastro-esophageal reflux disease without esophagitis: Secondary | ICD-10-CM | POA: Diagnosis present

## 2017-06-20 DIAGNOSIS — I6389 Other cerebral infarction: Secondary | ICD-10-CM

## 2017-06-20 DIAGNOSIS — I69398 Other sequelae of cerebral infarction: Secondary | ICD-10-CM | POA: Diagnosis not present

## 2017-06-20 HISTORY — PX: LOOP RECORDER INSERTION: EP1214

## 2017-06-20 LAB — CREATININE, SERUM
CREATININE: 0.81 mg/dL (ref 0.44–1.00)
GFR calc Af Amer: >60 mL/min (ref >60)
GFR calc non Af Amer: >60 mL/min (ref >60)

## 2017-06-20 LAB — CBC
HCT: 38.6 % (ref 36.0–46.0)
Hemoglobin: 12.7 g/dL (ref 12.0–15.0)
MCH: 31.5 pg (ref 26.0–34.0)
MCHC: 32.9 g/dL (ref 30.0–36.0)
MCV: 95.8 fL (ref 78.0–100.0)
Platelets: 158 10*3/uL (ref 150–400)
RBC: 4.03 MIL/uL (ref 3.87–5.11)
RDW: 12.7 % (ref 11.5–15.5)
WBC: 5.1 10*3/uL (ref 4.0–10.5)

## 2017-06-20 SURGERY — LOOP RECORDER INSERTION

## 2017-06-20 MED ORDER — ASPIRIN EC 325 MG PO TBEC
325.0000 mg | DELAYED_RELEASE_TABLET | Freq: Every day | ORAL | Status: DC
  Administered 2017-06-20 – 2017-06-26 (×7): 325 mg via ORAL
  Filled 2017-06-20 (×7): qty 1

## 2017-06-20 MED ORDER — ACETAMINOPHEN 325 MG PO TABS: 650.0000 mg | ORAL_TABLET | ORAL | Status: DC | PRN

## 2017-06-20 MED ORDER — SENNOSIDES-DOCUSATE SODIUM 8.6-50 MG PO TABS
1.0000 | ORAL_TABLET | Freq: Every evening | ORAL | Status: DC | PRN
  Administered 2017-06-22: 1 via ORAL
  Filled 2017-06-20: qty 1

## 2017-06-20 MED ORDER — ONDANSETRON HCL 4 MG PO TABS: 4.0000 mg | ORAL_TABLET | Freq: Four times a day (QID) | ORAL | Status: DC | PRN

## 2017-06-20 MED ORDER — ONDANSETRON HCL 4 MG/2ML IJ SOLN: 4.0000 mg | Freq: Four times a day (QID) | INTRAMUSCULAR | Status: DC | PRN

## 2017-06-20 MED ORDER — ATORVASTATIN CALCIUM 20 MG PO TABS
20.0000 mg | ORAL_TABLET | Freq: Every day | ORAL | Status: DC
  Administered 2017-06-20 – 2017-06-26 (×7): 20 mg via ORAL
  Filled 2017-06-20 (×7): qty 1

## 2017-06-20 MED ORDER — LIDOCAINE-EPINEPHRINE 1 %-1:100000 IJ SOLN
INTRAMUSCULAR | Status: DC | PRN
  Administered 2017-06-20: 30 mL

## 2017-06-20 MED ORDER — CYANOCOBALAMIN 1000 MCG/ML IJ SOLN
1000.0000 ug | Freq: Every day | INTRAMUSCULAR | Status: DC
  Administered 2017-06-20: 1000 ug via INTRAMUSCULAR
  Filled 2017-06-20: qty 1

## 2017-06-20 MED ORDER — PANTOPRAZOLE SODIUM 40 MG PO TBEC
40.0000 mg | DELAYED_RELEASE_TABLET | Freq: Every day | ORAL | Status: DC
  Administered 2017-06-21 – 2017-06-26 (×5): 40 mg via ORAL
  Filled 2017-06-20 (×6): qty 1

## 2017-06-20 MED ORDER — ENOXAPARIN SODIUM 40 MG/0.4ML ~~LOC~~ SOLN: 40.0000 mg | SUBCUTANEOUS | Status: DC

## 2017-06-20 MED ORDER — LIDOCAINE-EPINEPHRINE 1 %-1:100000 IJ SOLN
INTRAMUSCULAR | Status: AC
  Filled 2017-06-20: qty 1

## 2017-06-20 MED ORDER — ENOXAPARIN SODIUM 40 MG/0.4ML ~~LOC~~ SOLN
40.0000 mg | SUBCUTANEOUS | Status: DC
  Administered 2017-06-21 – 2017-06-26 (×6): 40 mg via SUBCUTANEOUS
  Filled 2017-06-20 (×6): qty 0.4

## 2017-06-20 MED ORDER — SORBITOL 70 % SOLN
30.0000 mL | Freq: Every day | Status: DC | PRN
  Administered 2017-06-26: 30 mL via ORAL
  Filled 2017-06-20: qty 30

## 2017-06-20 SURGICAL SUPPLY — 2 items
LOOP REVEAL LINQSYS (Prosthesis & Implant Heart) ×3 IMPLANT
PACK LOOP INSERTION (CUSTOM PROCEDURE TRAY) ×3 IMPLANT

## 2017-06-20 NOTE — Progress Notes (Signed)
Patient received at approximately 1545 via w/c. Patient alert and oriented x 4. Oriented patient and sister to room and call bell system. Left chest noted with loop recorder dressing CD&I. Patient and sister verbalized understanding of admission process. Continue with plan of care.  Cleotilde NeerJoyce, Zynasia Burklow S

## 2017-06-20 NOTE — Discharge Instructions (Signed)
monitor implant site care Keep incision clean and dry for 3 days. You can remove outer dressing tomorrow. Leave steri-strips (little pieces of tape) on until seen in the office for wound check appointment. Call the office [phone]) for redness, drainage, swelling, or fever.

## 2017-06-20 NOTE — Progress Notes (Signed)
Progress Note  Patient Name: [name] Date of Encounter: 06/20/2017  Primary Cardiologist: No primary care provider on file.   Subjective   Working with PT, no complaints, lookig forward to rehab, no CP, palpitations or SOB  Inpatient Medications    Scheduled Meds: . aspirin EC  325 mg Oral Daily  . atorvastatin  20 mg Oral q1800  . enoxaparin (LOVENOX) injection  40 mg Subcutaneous Q24H  . pantoprazole  40 mg Oral Daily   Continuous Infusions:  PRN Meds: acetaminophen **OR** [DISCONTINUED] acetaminophen (TYLENOL) oral liquid 160 mg/5 mL **OR** [DISCONTINUED] acetaminophen, labetalol, senna-docusate   Vital Signs    Vitals:   06/20/17 0500 06/20/17 0600 06/20/17 0700 06/20/17 0800  BP: 130/64 113/73 114/78 133/79  Pulse: 62 65 71 61  Resp: 15 17 19 17   Temp:    (!) 97.4 F (36.3 C)  TempSrc:    Oral  SpO2: 98% 95% 98% 100%  Weight:      Height:       No intake or output data in the 24 hours ending 06/20/17 0944 Filed Weights   06/16/17 0100 06/16/17 2312 06/18/17 1300  Weight: 133 lb 2.5 oz (60.4 kg) 139 lb 15.9 oz (63.5 kg) 133 lb 13.1 oz (60.7 kg)    Telemetry    SR only- Personally Reviewed  ECG    No new EKGs, all prior EKGs have been reviewed, no AF- Personally Reviewed  Physical Exam   GEN: No acute distress.   Neck: No JVD Cardiac: RRR, no murmurs, rubs, or gallops.  Respiratory: CTA b/l. GI: Soft, nontender, non-distended  MS: No edema; No deformity. Neuro:  Nonfocal  Psych: Normal affect   Labs    Chemistry Recent Labs  Lab 06/15/17 1707 06/16/17 0802 06/17/17 0413 06/18/17 0314  NA 138 138 140 134*  K 4.1 3.9 3.6 3.7  CL 105 108 108 104  CO2 22 23 24 22   GLUCOSE 127* 110* 104* 101*  BUN 18 10 8 13   CREATININE 0.76 0.72 0.73 0.78  CALCIUM 9.3 9.1 9.0 9.3  PROT 6.7  --   --   --   ALBUMIN 4.1  --   --   --   AST 33  --   --   --   ALT 19  --   --   --   ALKPHOS 76  --   --   --   BILITOT 0.4  --   --   --   GFRNONAA  >60 >60 >60 >60  GFRAA >60 >60 >60 >60  ANIONGAP 11 7 8 8      Hematology Recent Labs  Lab 06/16/17 0802 06/17/17 0413 06/18/17 0314  WBC 7.9 5.8 5.9  RBC 3.79* 3.64* 3.77*  HGB 11.9* 11.8* 11.8*  HCT 35.8* 34.7* 35.5*  MCV 94.5 95.3 94.2  MCH 31.4 32.4 31.3  MCHC 33.2 34.0 33.2  RDW 12.8 13.2 12.7  PLT 130* 127* 146*    Cardiac Enzymes Recent Labs  Lab 06/15/17 1707  TROPONINI <0.03   No results for input(s): TROPIPOC in the last 168 hours.   BNPNo results for input(s): BNP, PROBNP in the last 168 hours.   DDimer No results for input(s): DDIMER in the last 168 hours.   Radiology      Cardiac Studies   06/16/2017 Study Conclusions - Left ventricle: The cavity size was normal. Systolic function was vigorous. The estimated ejection fraction was in the range of 65% to 70%.Wall motion  was normal; there were no regional wall motion abnormalities. - Aortic valve: Trileaflet; moderately thickened, moderately calcified leaflets. - Aorta: Aortic root diameter at sino-tubular junction: 42 mm (ED). - Aortic root: The aortic root was mildly dilated. - Mitral valve: Moderately thickened leaflet, anterior, 1.2cm in diameter. Consider TEE for further clarification (potential papillary fibroelastoma). - Right ventricle: The cavity size was mildly dilated. Wall thickness was normal. - Pulmonary arteries: Systolic pressure was mildly increased. PA peak pressure: 35 mm Hg (S).  06/19/17: TEE Findings: - Normal ejection fraction 65% - Mildly thickened mitral valve with trace to mild mitral regurgitation, mild anterior leaf prolapse.  There is no evidence of fibroblastoma -Negative bubble study with no shunt. -No left atrial appendage thrombus. -No embolic cardiac source identified.     Patient Profile     82 y.o. female with no notable PMHx admitted with CVA  Assessment & Plan    1. Cryptogenic stroke     Patient was seen yesterday, discharge to CIR  was pending     Plans to discharge today     I revisited with the patient, no new questions, she remains agreeable to9 proceed     I have reviewed telemetry, no AFib  We will proceed with loop implant today   For questions or updates, please contact [facility] Please consult [email or web address] for contact info under Cardiology/STEMI.      Signed, Sheilah PigeonRenee Lynn Ursuy, PA-C  06/20/2017, 9:44 AM    Agree with Dr Elberta Fortisamnitz that ILR is indicated for afib monitoring post stroke.  Risks and benefits of ILR discussed with the patient who wishes to proceed.  Hillis RangeJames Crimson Dubberly MD, [facility] HospitalFACC 06/20/2017

## 2017-06-20 NOTE — Progress Notes (Signed)
I have an inpt rehab bed to admit pt to today. I met with pt and her sister at bedside and they are in agreement to admit after LOOP placed today. I have notified [facility] with Stroke service as well as Dr. Leonie Man. RN CM and SW aware. I will make the arrangements to admit today. [phone]

## 2017-06-20 NOTE — Discharge Summary (Signed)
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 Stroke [facility])    Past Surgical History:  Procedure Laterality Date  . TEE WITHOUT CARDIOVERSION N/A 06/19/2017   Procedure: TRANSESOPHAGEAL ECHOCARDIOGRAM (TEE) WITH LOOP;  Surgeon: Jake Bathe, MD;  Location: [facility] ENDOSCOPY;  Service: Cardiovascular;  Laterality: N/A;   Medications to be continued on Rehab . aspirin EC  325 mg Oral Daily  . atorvastatin  20 mg Oral q1800  . cyanocobalamin  1,000 mcg Intramuscular Daily  . enoxaparin (LOVENOX) injection  40 mg Subcutaneous Q24H  . pantoprazole  40 mg Oral Daily   LABORATORY STUDIES CBC    Component Value Date/Time   WBC 5.9 06/18/2017 0314   RBC 3.77 (L) 06/18/2017 0314   HGB 11.8 (L) 06/18/2017 0314   HCT 35.5 (L) 06/18/2017 0314   PLT 146 (L) 06/18/2017 0314   MCV 94.2 06/18/2017 0314   MCH 31.3 06/18/2017 0314   MCHC 33.2 06/18/2017 0314   RDW 12.7 06/18/2017 0314   LYMPHSABS 2.0 06/15/2017 1707   MONOABS 0.4 06/15/2017 1707   EOSABS 0.1 06/15/2017 1707   BASOSABS 0.0 06/15/2017 1707   CMP    Component Value Date/Time   NA 134 (L) 06/18/2017 0314   K 3.7 06/18/2017 0314   CL 104 06/18/2017 0314   CO2 22 06/18/2017 0314   GLUCOSE 101 (H) 06/18/2017 0314   BUN 13 06/18/2017 0314   CREATININE 0.78 06/18/2017 0314   CALCIUM 9.3 06/18/2017 0314   PROT 6.7 06/15/2017 1707   ALBUMIN 4.1 06/15/2017 1707   AST 33 06/15/2017 1707    ALT 19 06/15/2017 1707   ALKPHOS 76 06/15/2017 1707   BILITOT 0.4 06/15/2017 1707   GFRNONAA >60 06/18/2017 0314   GFRAA >60 06/18/2017 0314   COAGS Lab Results  Component Value Date   INR 0.97 06/15/2017   Lipid Panel    Component Value Date/Time   CHOL 213 (H) 06/16/2017 0440   TRIG 65 06/16/2017 0440   HDL 80 06/16/2017 0440   CHOLHDL 2.7 06/16/2017 0440   VLDL 13 06/16/2017 0440   LDLCALC 120 (H) 06/16/2017 0440   HgbA1C  Lab Results  Component Value Date   HGBA1C 5.2 06/16/2017   SIGNIFICANT DIAGNOSTIC STUDIES Ct Angio Head W Or Wo Contrast Ct Angio Neck W Or Wo Contrast Ct Cerebral Perfusion W Contrast 06/15/2017 IMPRESSION:  1. Left P2 segment occlusion with downstream reconstitution. No infarct by CTP but there is 13cc of measurable left occipital lobe penumbra.  2. Very mild atheromatous changes for age. No stenosis or noted embolic souce.   Ct Head Wo Contrast 06/16/2017 IMPRESSION:  1. Stable distribution of left PCA infarct. Interval increase in edema and local mass effect. No hemorrhage or herniation.  2. No new acute intracranial abnormality identified.   Ct Head Wo Contrast 06/15/2017 IMPRESSION:  Negative head CT.   CT Head 06/18/2017 Increased edema and mass effect associated with the LEFT PCA territory infarction in the  occipital and temporal lobes, when compared with most recent prior CT 06/16/2017. Central heterogeneity suggests reperfusion microhemorrhage. If clinically indicated, MR is more sensitive in the detection of such.  Mr Brain Wo Contrast 06/16/2017 IMPRESSION:  1. Moderate sized acute ischemic nonhemorrhagic left PCA territory infarct.  2. Otherwise normal brain MRI.   TTE  06/16/2017 Study Conclusions - Left ventricle: The cavity size was normal. Systolic function was vigorous. The estimated ejection fraction was in the range of 65% to 70%. Wall motion was normal; there were no regional wall motion abnormalities. -  Aortic valve: Trileaflet; moderately thickened, moderately calcified leaflets. - Aorta: Aortic root diameter at sino-tubular junction: 42 mm (ED). - Aortic root: The aortic root was mildly dilated. - Mitral valve: Moderately thickened leaflet, anterior, 1.2cm in diameter. Consider TEE for further clarification (potential papillary fibroelastoma). - Right ventricle: The cavity size was mildly dilated. Wall thickness was normal. - Pulmonary arteries: Systolic pressure was mildly increased. PA peak pressure: 35 mm Hg (S).  TEE  06/19/2017 Study Conclusions - Left ventricle: Systolic function was vigorous. The estimated   ejection fraction was in the range of 65% to 70%. Wall motion was   normal; there were no regional wall motion abnormalities. - Aortic valve: Trileaflet; mildly thickened, mildly calcified   leaflets. - Mitral valve: Moderate prolapse, involving the anterior leaflet.   There was mild regurgitation. No evidence of papillary   fibroelastoma. - Left atrium: No evidence of thrombus in the atrial cavity or   appendage. - Right atrium: No evidence of thrombus in the atrial cavity or   appendage. - Atrial septum: No defect or patent foramen ovale was identified.   Echo contrast study showed no right-to-left atrial level shunt,   following an increase in RA pressure induced by provocative   maneuvers. Impressions: - No cardiac source of emboli was indentified.    HISTORY OF PRESENT ILLNESS   &   HOSPITAL COURSE ASSESSMENT/PLAN Ms. [name] is a 82 y.o. female with history of hearing impairement admitted for right sided weakness, numbness, slurry speech and right hemianopia. TPA given.  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  Resultant right hemianopia, right sensory neglect, right ataxia  MRI  Left PCA infarct including left occipital and left thalamus  CT head shows increased edema, transfer to NeuroICU for closer  monitoring  CTA head and neck left P2/P3 occlusion  CTP - no core, only minimal left occipital penumbra  2D Echo - 65-70%  TEE Negative for PFO, Loop recorder placement 06/20/2017  LDL 120  HgbA1c 5.2  SCDs for VTE prophylaxis  Diet regular Room service appropriate? Yes; Fluid consistency: Thin   No antithrombotic prior to admission, now on No antithrombotic within 24h of tPA  Ongoing aggressive stroke risk factor management  Therapy recommendations:  CIR   Disposition:  CIR today  Hyperlipidemia  Home meds:  none   LDL 120, goal < 70  Now on Lipitor 20 mg daily  Continue statin at discharge  Anemia - stable CIR and PCP to monitor  B12 Deficiency Supplementation in progress  Other Stroke Risk Factors  Advanced age  Other Active Problems  Family report mild cognitive impairment over time, outpatient Neurology workup  Assessment Plan  Loop Placement today  Discharge to CIR today  DISCHARGE EXAM Blood pressure 114/81, pulse 74, temperature (!) 97.4 F (36.3 C), temperature source Oral, resp. rate 17, height 5\' 7"  (1.702 m), weight 60.7 kg (133 lb 13.1 oz),  SpO2 100 %. General - Well nourished, well developed, in no apparent distress.  Ophthalmologic - fundi not visualized due to noncooperation.  Cardiovascular - Regular rate and rhythm with no murmur.  Mental Status -  Level of arousal and orientation to time, place, and person were intact. Language including expression, naming, repetition, comprehension was assessed and found intact. Fund of Knowledge was assessed and was intact.  Cranial Nerves II - XII - II - right hemianopia. III, IV, VI - Extraocular movements intact. V - Facial sensation intact bilaterally. VII - Facial movement intact bilaterally. VIII - Hearing & vestibular intact bilaterally. X - Palate elevates symmetrically. XI - Chin turning & shoulder shrug intact bilaterally. XII - Tongue protrusion intact.  Motor  Strength - The patient's strength was normal in all extremities and pronator drift was absent.  Bulk was normal and fasciculations were absent.   Motor Tone - Muscle tone was assessed at the neck and appendages and was normal. Reflexes - The patient's reflexes were symmetrical in all extremities and she had no pathological reflexes. Sensory - Light touch, temperature/pinprick were assessed and were diminished on the RLE, but also has b/l sensory neglect.   Coordination - The patient had right UE and LE mild ataxia.  Tremor was absent. Gait and Station - deferred  Discharge Diet  Diet regular Room service appropriate? Yes; Fluid consistency: Thin liquids  DISCHARGE PLAN  [facility]  [facility], OT and ST  aspirin 325 mg daily for secondary stroke prevention.  Recommend ongoing risk factor control by Primary Care Physician at time of discharge from inpatient rehabilitation.  Follow-up Patient, No Pcp Per in 2 weeks following discharge from rehab.Case Management aware.  Follow-up with Dr. Delia HeadyPramod Sethi, Stroke Clinic in 6 weeks, office to schedule an appointment.   No Driving until follow up with PCP or cleared by Opthomologist  Greater than 40 minutes were spent preparing discharge.  Beryl MeagerMary A Costello, ANP-C 06/20/2017 10:54AM I have personally examined this patient, reviewed notes, independently viewed imaging studies, participated in medical decision making and plan of care.ROS completed by me personally and pertinent positives fully documented  I have made any additions or clarifications directly to the above note. Agree with note above.   Delia HeadyPramod Sethi, MD Medical Director [facility] Pager: [phone] 06/20/2017 5:41 PM

## 2017-06-20 NOTE — Progress Notes (Signed)
Physical Medicine and Rehabilitation Consult Reason for Consult: Right side weakness Referring Physician: Dr.Xu   HPI: [name] is a 82 y.o. right handed female with unremarkable past medical history on no prescription medications. Per chart review patient lives alone independent prior to admission. Two-level home with bedroom and bathroom on first floor.Family live in the area questioned assistance on discharge. Presented 06/15/2017 was sudden onset of dizziness with right-sided weakness, slurred speech blurred vision and fall. Cranial CT scan negative. Patient did receive TPA. CT cerebral perfusion scan as well as CT angiogram head and neck showed left P2 segment occlusion. 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 Echocardiogram with ejection fraction of 70% no wall motion abnormalities. Neurology follow-up await plan for TEE loop recorder and workup presently ongoing. Physical and occupational therapy evaluation completed 06/16/2017 with recommendations of physical medicine rehabilitation consult.   Review of Systems  Constitutional: Negative for chills and fever.  HENT: Negative for hearing loss.   Eyes: Positive for blurred vision. Negative for double vision.  Respiratory: Negative for cough and shortness of breath.   Cardiovascular: Negative for chest pain, palpitations and leg swelling.  Gastrointestinal: Positive for constipation. Negative for nausea.  Genitourinary: Negative for dysuria, flank pain and hematuria.  Musculoskeletal: Positive for myalgias.  Skin: Negative for rash.  Neurological: Positive for dizziness, sensory change, speech change and focal weakness.  All other systems reviewed and are negative.  No past medical history on file. No past surgical history on file. No family history on file. Social History:  reports that  has never smoked. She does not have any smokeless tobacco history on file. She reports  that she does not drink alcohol or use drugs. Allergies: No Known Allergies No medications prior to admission.    Home: Home Living Family/patient expects to be discharged to:: Private residence Living Arrangements: Alone Available Help at Discharge: Family, Available 24 hours/day Type of Home: House Home Layout: Two level, Laundry or work area in basement, Able to live on main level with bedroom/bathroom Bathroom Shower/Tub: Health visitorWalk-in shower Bathroom Toilet: Standard Home Equipment: None  Functional History: Prior Function Level of Independence: Independent Functional Status:  Mobility: Bed Mobility Overal bed mobility: Needs Assistance Bed Mobility: Supine to Sit Supine to sit: Min guard, HOB elevated General bed mobility comments: Pt OOB in bathroom with PT upon arrival. Transfers Overall transfer level: Needs assistance Equipment used: 1 person hand held assist Transfers: Sit to/from Stand Sit to Stand: Min assist General transfer comment: min assist to stand from bed and toilet with cues for hand placement and safety Ambulation/Gait Ambulation/Gait assistance: Mod assist, +2 safety/equipment Ambulation Distance (Feet): 30 Feet Assistive device: 2 person hand held assist Gait Pattern/deviations: Step-through pattern, Decreased stride length, Leaning posteriorly, Narrow base of support General Gait Details: pt with narrow BOS with right posterior lean with very short shuffling steps, not attending to right side with trunk rotated and partially side stepping to ambulate with cues for visual target, hand placement, sequence and safety Gait velocity interpretation: Below normal speed for age/gender  ADL: ADL Overall ADL's : Needs assistance/impaired Grooming: Moderate assistance, Sitting, Oral care Upper Body Bathing: Sitting, Moderate assistance Lower Body Bathing: Moderate assistance, Sit to/from stand Upper Body Dressing : Moderate assistance, Sitting Lower Body  Dressing: Moderate assistance, Sit to/from stand Toilet Transfer: Moderate assistance, +2 for physical assistance, Ambulation, Comfort height toilet, Grab bars Toileting- Clothing Manipulation and Hygiene: Minimal assistance, Sit to/from stand Functional mobility during ADLs:  Moderate assistance, +2 for physical assistance  Cognition: Cognition Overall Cognitive Status: Impaired/Different from baseline Cognition Arousal/Alertness: Awake/alert Behavior During Therapy: Impulsive Overall Cognitive Status: Impaired/Different from baseline Area of Impairment: Orientation, Attention, Memory, Following commands, Safety/judgement, Problem solving Orientation Level: Disoriented to, Time Current Attention Level: Sustained Memory: Decreased short-term memory Following Commands: Follows one step commands inconsistently, Follows one step commands with increased time Safety/Judgement: Decreased awareness of safety, Decreased awareness of deficits Problem Solving: Requires tactile cues, Requires verbal cues, Decreased initiation  Blood pressure (!) 151/96, pulse 80, temperature 97.9 F (36.6 C), temperature source Oral, resp. rate (!) 21, height 5\' 3"  (1.6 m), weight 60.4 kg (133 lb 2.5 oz), SpO2 100 %. Physical Exam  Vitals reviewed. Constitutional: She appears well-developed.  Eyes:  Pupils reactive to light  Neck: Normal range of motion. Neck supple. No thyromegaly present.  Cardiovascular: Normal rate, regular rhythm and normal heart sounds.  Respiratory: Effort normal and breath sounds normal. No respiratory distress.  GI: Soft. Bowel sounds are normal. She exhibits no distension.  Neurological:  Mood is flat but appropriate. Follows simple commands. Provides her name and age. Right HH. Right pronator drift and decreased FTN, HTN right upper and lower exts. Senses pain in all 4's.   Skin: Skin is warm and dry.    LabResultsLast24Hours       Results for orders placed or performed  during the hospital encounter of 06/15/17 (from the past 24 hour(s))  MRSA PCR Screening     Status: None   Collection Time: 06/16/17  1:35 AM  Result Value Ref Range   MRSA by PCR NEGATIVE NEGATIVE  Hemoglobin A1c     Status: None   Collection Time: 06/16/17  4:40 AM  Result Value Ref Range   Hgb A1c MFr Bld 5.2 4.8 - 5.6 %   Mean Plasma Glucose 102.54 mg/dL  Lipid panel     Status: Abnormal   Collection Time: 06/16/17  4:40 AM  Result Value Ref Range   Cholesterol 213 (H) 0 - 200 mg/dL   Triglycerides 65 <161 mg/dL   HDL 80 >09 mg/dL   Total CHOL/HDL Ratio 2.7 RATIO   VLDL 13 0 - 40 mg/dL   LDL Cholesterol 604 (H) 0 - 99 mg/dL  TSH     Status: None   Collection Time: 06/16/17  8:02 AM  Result Value Ref Range   TSH 1.132 0.350 - 4.500 uIU/mL  Vitamin B12     Status: Abnormal   Collection Time: 06/16/17  8:02 AM  Result Value Ref Range   Vitamin B-12 146 (L) 180 - 914 pg/mL  CBC     Status: Abnormal   Collection Time: 06/16/17  8:02 AM  Result Value Ref Range   WBC 7.9 4.0 - 10.5 K/uL   RBC 3.79 (L) 3.87 - 5.11 MIL/uL   Hemoglobin 11.9 (L) 12.0 - 15.0 g/dL   HCT 54.0 (L) 98.1 - 19.1 %   MCV 94.5 78.0 - 100.0 fL   MCH 31.4 26.0 - 34.0 pg   MCHC 33.2 30.0 - 36.0 g/dL   RDW 47.8 29.5 - 62.1 %   Platelets 130 (L) 150 - 400 K/uL  Basic metabolic panel     Status: Abnormal   Collection Time: 06/16/17  8:02 AM  Result Value Ref Range   Sodium 138 135 - 145 mmol/L   Potassium 3.9 3.5 - 5.1 mmol/L   Chloride 108 101 - 111 mmol/L   CO2 23 22 - 32 mmol/L  Glucose, Bld 110 (H) 65 - 99 mg/dL   BUN 10 6 - 20 mg/dL   Creatinine, Ser 1.61 0.44 - 1.00 mg/dL   Calcium 9.1 8.9 - 09.6 mg/dL   GFR calc non Af Amer >60 >60 mL/min   GFR calc Af Amer >60 >60 mL/min   Anion gap 7 5 - 15      ImagingResults(Last48hours)  Ct Angio Head W Or Wo Contrast  Result Date: 06/15/2017 CLINICAL DATA:  Fall and right arm numbness with aphasia that  resolved. Right homonymous hemianopia. EXAM: CT ANGIOGRAPHY HEAD AND NECK CT PERFUSION BRAIN TECHNIQUE: Multidetector CT imaging of the head and neck was performed using the standard protocol during bolus administration of intravenous contrast. Multiplanar CT image reconstructions and MIPs were obtained to evaluate the vascular anatomy. Carotid stenosis measurements (when applicable) are obtained utilizing NASCET criteria, using the distal internal carotid diameter as the denominator. Multiphase CT imaging of the brain was performed following IV bolus contrast injection. Subsequent parametric perfusion maps were calculated using RAPID software. CONTRAST:  ISOVUE-370 IOPAMIDOL (ISOVUE-370) INJECTION 76% COMPARISON:  Noncontrast head CT earlier today FINDINGS: CTA NECK FINDINGS Aortic arch: No acute finding.  Three vessel branching. Right carotid system: Mild atherosclerotic plaque at the ICA bulb. No beading, stenosis, or ulceration. Left carotid system: Mild atherosclerotic plaque at the common carotid bifurcation and ICA bulb. No stenosis, ulceration, or beading. Vertebral arteries: No proximal subclavian stenosis. Mild atheromatous plaque at the vertebral origins. No stenosis or beading. Skeleton: No acute finding. Other neck: No incidental mass or inflammation. Upper chest: Negative Review of the MIP images confirms the above findings CTA HEAD FINDINGS Anterior circulation: No noted atheromatous changes. No branch occlusion or stenosis. Negative for beading or aneurysm. Posterior circulation: Vertebral and basilar arteries are smooth and widely patent. There is a left P2 segment occlusion or pre occlusion with flow gap. Venous sinuses: Patent Anatomic variants: None significant Delayed phase: No abnormal intracranial enhancement. Review of the MIP images confirms the above findings CT Brain Perfusion Findings: CBF (<30%) Volume: 0mL Perfusion (Tmax>6.0s) volume: 13mL These results were called by telephone  at the time of interpretation on 06/15/2017 at 7:08 pm to Dr. Shaune Pollack. Patient has already been given tPA and is being transferred. IMPRESSION: 1. Left P2 segment occlusion with downstream reconstitution. No infarct by CTP but there is 13cc of measurable left occipital lobe penumbra. 2. Very mild atheromatous changes for age. No stenosis or noted embolic souce. Electronically Signed   By: Marnee Spring M.D.   On: 06/15/2017 19:20   Ct Head Wo Contrast  Result Date: 06/15/2017 CLINICAL DATA:  Altered level of consciousness.  Fall.  Aphasia. EXAM: CT HEAD WITHOUT CONTRAST TECHNIQUE: Contiguous axial images were obtained from the base of the skull through the vertex without intravenous contrast. COMPARISON:  None. FINDINGS: Brain: There is no evidence of acute infarct, intracranial hemorrhage, mass, midline shift, or extra-axial fluid collection. The ventricles and sulci are normal. Vascular: No hyperdense vessel. Skull: No fracture or suspicious osseous lesion. 5 mm sclerotic focus in the left frontal bone, possibly a bone island. Sinuses/Orbits: Visualized paranasal sinuses and mastoid air cells are clear. Bilateral cataract extraction. Other: None. IMPRESSION: Negative head CT. These results were called by telephone at the time of interpretation on 06/15/2017 at 5:27 pm to Dr. Governor Rooks , who verbally acknowledged these results. Electronically Signed   By: Sebastian Ache M.D.   On: 06/15/2017 17:28   Ct Angio Neck W Or Wo Contrast  Result Date: 06/15/2017 CLINICAL DATA:  Fall and right arm numbness with aphasia that resolved. Right homonymous hemianopia. EXAM: CT ANGIOGRAPHY HEAD AND NECK CT PERFUSION BRAIN TECHNIQUE: Multidetector CT imaging of the head and neck was performed using the standard protocol during bolus administration of intravenous contrast. Multiplanar CT image reconstructions and MIPs were obtained to evaluate the vascular anatomy. Carotid stenosis measurements (when applicable) are obtained  utilizing NASCET criteria, using the distal internal carotid diameter as the denominator. Multiphase CT imaging of the brain was performed following IV bolus contrast injection. Subsequent parametric perfusion maps were calculated using RAPID software. CONTRAST:  ISOVUE-370 IOPAMIDOL (ISOVUE-370) INJECTION 76% COMPARISON:  Noncontrast head CT earlier today FINDINGS: CTA NECK FINDINGS Aortic arch: No acute finding.  Three vessel branching. Right carotid system: Mild atherosclerotic plaque at the ICA bulb. No beading, stenosis, or ulceration. Left carotid system: Mild atherosclerotic plaque at the common carotid bifurcation and ICA bulb. No stenosis, ulceration, or beading. Vertebral arteries: No proximal subclavian stenosis. Mild atheromatous plaque at the vertebral origins. No stenosis or beading. Skeleton: No acute finding. Other neck: No incidental mass or inflammation. Upper chest: Negative Review of the MIP images confirms the above findings CTA HEAD FINDINGS Anterior circulation: No noted atheromatous changes. No branch occlusion or stenosis. Negative for beading or aneurysm. Posterior circulation: Vertebral and basilar arteries are smooth and widely patent. There is a left P2 segment occlusion or pre occlusion with flow gap. Venous sinuses: Patent Anatomic variants: None significant Delayed phase: No abnormal intracranial enhancement. Review of the MIP images confirms the above findings CT Brain Perfusion Findings: CBF (<30%) Volume: 0mL Perfusion (Tmax>6.0s) volume: 13mL These results were called by telephone at the time of interpretation on 06/15/2017 at 7:08 pm to Dr. Shaune Pollack. Patient has already been given tPA and is being transferred. IMPRESSION: 1. Left P2 segment occlusion with downstream reconstitution. No infarct by CTP but there is 13cc of measurable left occipital lobe penumbra. 2. Very mild atheromatous changes for age. No stenosis or noted embolic souce. Electronically Signed   By: Marnee Spring M.D.   On: 06/15/2017 19:20   Mr Brain Wo Contrast  Result Date: 06/16/2017 CLINICAL DATA:  Follow-up examination for stroke. Right-sided numbness. EXAM: MRI HEAD WITHOUT CONTRAST TECHNIQUE: Multiplanar, multiecho pulse sequences of the brain and surrounding structures were obtained without intravenous contrast. COMPARISON:  Prior CTs from 06/15/2016. FINDINGS: Brain: Study degraded by motion artifact. Cerebral volume normal. Minimal T2/FLAIR hyperintensity within the periventricular white matter, most like related chronic small vessel ischemic disease, felt to be within normal limits for age. Moderate-sized confluent area of restricted diffusion seen involving the parasagittal left temporal occipital region, consistent with acute left PCA territory infarct. Involvement of the left thalamus. No associated mass effect or hemorrhage. No other evidence for acute or subacute ischemia. Gray-white matter differentiation otherwise maintained. No mass lesion, midline shift or mass effect. No hydrocephalus. No extra-axial fluid collection. Pituitary gland suprasellar region normal. Midline structures intact and normal. Vascular: Major intracranial vascular flow voids maintained at the skullbase. Skull and upper cervical spine: Craniocervical junction normal. Upper cervical spine within normal limits. Bone marrow signal intensity normal. No scalp soft tissue abnormality. Sinuses/Orbits: Globes normal soft tissues within normal limits. Patient status post cataract extraction bilaterally. Paranasal sinuses are clear. Trace right mastoid effusion. Inner ear structures normal. Other: None IMPRESSION: 1. Moderate sized acute ischemic nonhemorrhagic left PCA territory infarct. 2. Otherwise normal brain MRI. Electronically Signed   By: Janell Quiet.D.  On: 06/16/2017 00:49   Ct Cerebral Perfusion W Contrast  Result Date: 06/15/2017 CLINICAL DATA:  Fall and right arm numbness with aphasia that resolved.  Right homonymous hemianopia. EXAM: CT ANGIOGRAPHY HEAD AND NECK CT PERFUSION BRAIN TECHNIQUE: Multidetector CT imaging of the head and neck was performed using the standard protocol during bolus administration of intravenous contrast. Multiplanar CT image reconstructions and MIPs were obtained to evaluate the vascular anatomy. Carotid stenosis measurements (when applicable) are obtained utilizing NASCET criteria, using the distal internal carotid diameter as the denominator. Multiphase CT imaging of the brain was performed following IV bolus contrast injection. Subsequent parametric perfusion maps were calculated using RAPID software. CONTRAST:  ISOVUE-370 IOPAMIDOL (ISOVUE-370) INJECTION 76% COMPARISON:  Noncontrast head CT earlier today FINDINGS: CTA NECK FINDINGS Aortic arch: No acute finding.  Three vessel branching. Right carotid system: Mild atherosclerotic plaque at the ICA bulb. No beading, stenosis, or ulceration. Left carotid system: Mild atherosclerotic plaque at the common carotid bifurcation and ICA bulb. No stenosis, ulceration, or beading. Vertebral arteries: No proximal subclavian stenosis. Mild atheromatous plaque at the vertebral origins. No stenosis or beading. Skeleton: No acute finding. Other neck: No incidental mass or inflammation. Upper chest: Negative Review of the MIP images confirms the above findings CTA HEAD FINDINGS Anterior circulation: No noted atheromatous changes. No branch occlusion or stenosis. Negative for beading or aneurysm. Posterior circulation: Vertebral and basilar arteries are smooth and widely patent. There is a left P2 segment occlusion or pre occlusion with flow gap. Venous sinuses: Patent Anatomic variants: None significant Delayed phase: No abnormal intracranial enhancement. Review of the MIP images confirms the above findings CT Brain Perfusion Findings: CBF (<30%) Volume: 0mL Perfusion (Tmax>6.0s) volume: 13mL These results were called by telephone at the time  of interpretation on 06/15/2017 at 7:08 pm to Dr. Shaune Pollack. Patient has already been given tPA and is being transferred. IMPRESSION: 1. Left P2 segment occlusion with downstream reconstitution. No infarct by CTP but there is 13cc of measurable left occipital lobe penumbra. 2. Very mild atheromatous changes for age. No stenosis or noted embolic souce. Electronically Signed   By: Marnee Spring M.D.   On: 06/15/2017 19:20     Assessment/Plan: Diagnosis: left PCA infarct 1. Does the need for close, 24 hr/day medical supervision in concert with the patient's rehab needs make it unreasonable for this patient to be served in a less intensive setting? Yes 2. 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 Due to bladder management, bowel management, safety, skin/wound care, disease management, medication administration and patient education, does the patient require 24 hr/day rehab nursing? Yes 4. Does the patient require coordinated care of a physician, rehab nurse, PT (1-2 hrs/day, 5 days/week) and OT (1-2 hrs/day, 5 days/week) to address physical and functional deficits in the context of the above medical diagnosis(es)? Yes Addressing deficits in the following areas: balance, endurance, locomotion, strength, transferring, bowel/bladder control, bathing, dressing, feeding, grooming, toileting and psychosocial support 5. Can the patient actively participate in an intensive therapy program of at least 3 hrs of therapy per day at least 5 days per week? Yes 6. The potential for patient to make measurable gains while on inpatient rehab is excellent 7. Anticipated functional outcomes upon discharge from inpatient rehab are modified independent and supervision  with PT, modified independent and supervision with OT, n/a with SLP. 8. Estimated rehab length of stay to reach the above functional goals is: 10-15 days 9. Anticipated D/C setting: Home 10. Anticipated post D/C  treatments:  HH therapy 11. Overall Rehab/Functional Prognosis: excellent  RECOMMENDATIONS: This patient's condition is appropriate for continued rehabilitative care in the following setting: CIR Patient has agreed to participate in recommended program. Yes Note that insurance prior authorization may be required for reimbursement for recommended care.  Comment: Rehab Admissions Coordinator to follow up.  Thanks,  Ranelle Oyster, MD, [place] [facility]    Mcarthur Rossetti Angiulli, PA-C 06/16/2017

## 2017-06-20 NOTE — H&P (Signed)
Physical Medicine and Rehabilitation Admission H&P    Chief complaint: Weakness  HPI: [name] a 82 y.o.right handed femalewith unremarkable past medical history on no prescription medications. Per chart review patient lives alone independent prior to admission. Two-level home with bedroom and bathroom on first floor.Family live in the area questioned assistance on discharge.Presented 06/15/2017 to [facility] with sudden onset of dizziness with right-sided weakness, slurred speech, blurred visionand fall. Cranial CT scan negative. Patient did receive TPA. CT cerebral perfusion scan as well as CT angiogram head and neck showed left P2 segment occlusion. 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 Echocardiogram with ejection fraction of 70% no wall motion abnormalities. Follow-up CT of the head 06/18/2017 showed some increased edema and mass effect associated with left PCA infarction and continue to monitor. Neurology follow-up and currently maintained on aspirin for CVA prophylaxis. Subcutaneous Lovenox for DVT prophylaxis. TEE showed ejection fraction 65%. Negative bubble study. No thrombus noted. Maintained on a regular diet. Physical and occupational therapy evaluation completed 06/16/2017 with recommendations of physical medicine rehabilitation consult. Patient was admitted for a comprehensive rehabilitation program    Review of Systems  Constitutional: Negative for chills and fever.  HENT: Negative for hearing loss.   Eyes: Positive for blurred vision.  Respiratory: Negative for cough and shortness of breath.   Cardiovascular: Negative for chest pain and leg swelling.  Gastrointestinal: Positive for constipation. Negative for heartburn, nausea and vomiting.  Genitourinary: Negative for dysuria, flank pain and hematuria.  Musculoskeletal: Positive for myalgias.  Skin: Negative for rash.  Neurological: Positive  for dizziness, sensory change, speech change and focal weakness.  All other systems reviewed and are negative.      Past Medical History:  Diagnosis Date  . Stroke [facility])    History reviewed. No pertinent surgical history. History reviewed. No pertinent family history. Social History:  reports that  has never smoked. she has never used smokeless tobacco. She reports that she does not drink alcohol or use drugs. Allergies: No Known Allergies No medications prior to admission.    Drug Regimen Review Drug regimen was reviewed and remains appropriate with no significant issues identified  Home: Home Living Family/patient expects to be discharged to:: Private residence Living Arrangements: Alone Available Help at Discharge: Family, Available 24 hours/day Type of Home: House Home Layout: Two level, Laundry or work area in basement, Able to live on main level with bedroom/bathroom Bathroom Shower/Tub: Multimedia programmer: Standard Home Equipment: None   Functional History: Prior Function Level of Independence: Independent  Functional Status:  Mobility: Bed Mobility Overal bed mobility: Needs Assistance Bed Mobility: Supine to Sit Supine to sit: Min guard, HOB elevated General bed mobility comments: Pt OOB in bathroom with PT upon arrival. Transfers Overall transfer level: Needs assistance Equipment used: 1 person hand held assist Transfers: Sit to/from Stand Sit to Stand: Min assist General transfer comment: min assist to stand from bed and toilet with cues for hand placement and safety Ambulation/Gait Ambulation/Gait assistance: Mod assist, +2 safety/equipment Ambulation Distance (Feet): 30 Feet Assistive device: 2 person hand held assist Gait Pattern/deviations: Step-through pattern, Decreased stride length, Leaning posteriorly, Narrow base of support General Gait Details: pt with narrow BOS with right posterior lean with very short shuffling steps, not  attending to right side with trunk rotated and partially side stepping to ambulate with cues for visual target, hand placement, sequence and safety Gait velocity interpretation: Below normal speed for age/gender  ADL: ADL Overall ADL's : Needs assistance/impaired Grooming: Moderate assistance, Sitting, Oral care Upper Body Bathing: Sitting, Moderate assistance Lower Body Bathing: Moderate assistance, Sit to/from stand Upper Body Dressing : Moderate assistance, Sitting Lower Body Dressing: Moderate assistance, Sit to/from stand Toilet Transfer: Moderate assistance, +2 for physical assistance, Ambulation, Comfort height toilet, Grab bars Toileting- Clothing Manipulation and Hygiene: Minimal assistance, Sit to/from stand Functional mobility during ADLs: Moderate assistance, +2 for physical assistance  Cognition: Cognition Overall Cognitive Status: Impaired/Different from baseline Arousal/Alertness: Awake/alert Orientation Level: Oriented to person, Oriented to place, Oriented to time, Oriented to situation(forgetful but remembers eventually) Attention: Focused, Sustained Focused Attention: Appears intact Sustained Attention: Appears intact Memory: Impaired Memory Impairment: Decreased recall of new information Awareness: Impaired Awareness Impairment: Intellectual impairment, Emergent impairment, Anticipatory impairment Problem Solving: Impaired Problem Solving Impairment: Verbal basic, Functional basic Behaviors: Restless, Impulsive Safety/Judgment: Impaired Cognition Arousal/Alertness: Awake/alert Behavior During Therapy: Impulsive Overall Cognitive Status: Impaired/Different from baseline Area of Impairment: Orientation, Attention, Memory, Following commands, Safety/judgement, Problem solving Orientation Level: Disoriented to, Time Current Attention Level: Sustained Memory: Decreased short-term memory Following Commands: Follows one step commands inconsistently, Follows one  step commands with increased time Safety/Judgement: Decreased awareness of safety, Decreased awareness of deficits Problem Solving: Requires tactile cues, Requires verbal cues, Decreased initiation  Physical Exam: Blood pressure (!) 152/72, pulse (!) 59, temperature 97.6 F (36.4 C), temperature source Oral, resp. rate 16, height _0  (1.702 m), weight 60.7 kg (133 lb 13.1 oz), SpO2 100 %. Physical Exam  Vitals reviewed. Constitutional: She appears well-developed.  HENT:  Head: Normocephalic.  Eyes: EOM are normal. Pupils are equal, round, and reactive to light. Right eye exhibits no discharge. Left eye exhibits no discharge.  Neck: Normal range of motion. Neck supple. No tracheal deviation present. No thyromegaly present.  Cardiovascular: Normal rate, regular rhythm and normal heart sounds. Exam reveals no friction rub.  No murmur heard. Respiratory: Effort normal. No respiratory distress. She has no wheezes. She has no rales.  GI: Soft. Bowel sounds are normal. She exhibits no distension.   Skin. Warm and dry Neurological: Mood is flat but appropriate. Follows simple commands. Reasonable insight and awareness. Sometimes HOH.  Provides her name and age. Right HH noted. Continued right PD. RUE 4/5 prox to distal. RLE: 4/5 prox to distal. Decreased FTN, HTS  right upper and lower exts. Senses pain in all 4's Psych: pleasant and appropriate     LabResultsLast48Hours  Results for orders placed or performed during the hospital encounter of 06/15/17 (from the past 48 hour(s))  CBC     Status: Abnormal   Collection Time: 06/18/17  3:14 AM  Result Value Ref Range   WBC 5.9 4.0 - 10.5 K/uL   RBC 3.77 (L) 3.87 - 5.11 MIL/uL   Hemoglobin 11.8 (L) 12.0 - 15.0 g/dL   HCT 35.5 (L) 36.0 - 46.0 %   MCV 94.2 78.0 - 100.0 fL   MCH 31.3 26.0 - 34.0 pg   MCHC 33.2 30.0 - 36.0 g/dL   RDW 12.7 11.5 - 15.5 %   Platelets 146 (L) 150 - 400 K/uL  Basic metabolic panel     Status:  Abnormal   Collection Time: 06/18/17  3:14 AM  Result Value Ref Range   Sodium 134 (L) 135 - 145 mmol/L   Potassium 3.7 3.5 - 5.1 mmol/L   Chloride 104 101 - 111 mmol/L   CO2 22 22 - 32 mmol/L   Glucose, Bld 101 (H) 65 - 99 mg/dL  BUN 13 6 - 20 mg/dL   Creatinine, Ser 0.78 0.44 - 1.00 mg/dL   Calcium 9.3 8.9 - 10.3 mg/dL   GFR calc non Af Amer >60 >60 mL/min   GFR calc Af Amer >60 >60 mL/min    Comment: (NOTE) The eGFR has been calculated using the CKD EPI equation. This calculation has not been validated in all clinical situations. eGFR's persistently <60 mL/min signify possible Chronic Kidney Disease.    Anion gap 8 5 - 15      ImagingResults(Last48hours)  Ct Head Wo Contrast  Result Date: 06/18/2017 CLINICAL DATA:  Continued surveillance LEFT PCA infarct, dizziness today. EXAM: CT HEAD WITHOUT CONTRAST TECHNIQUE: Contiguous axial images were obtained from the base of the skull through the vertex without intravenous contrast. COMPARISON:  Multiple priors.  Most recent CT 06/16/2017. FINDINGS: Brain: Increased edema and mass effect associated with the acute LEFT PCA territory infarction involving the medial and posterior temporal lobe and occipital lobes. Central heterogeneity suggests reperfusion microhemorrhage. No similar heterogeneity in the LEFT thalamus. No new areas of infarction. No midline shift. Vascular: No hyperdense vessel or unexpected calcification. Skull: Normal. Negative for fracture or focal lesion. Sinuses/Orbits: No acute finding. Other: None. IMPRESSION: Increased edema and mass effect associated with the LEFT PCA territory infarction in the occipital and temporal lobes, when compared with most recent prior CT 06/16/2017. Central heterogeneity suggests reperfusion microhemorrhage. If clinically indicated, MR is more sensitive in the detection of such. A call has been placed to the ordering provider. Electronically Signed   By: Staci Righter M.D.    On: 06/18/2017 11:22        Medical Problem List and Plan: 1.  Right side weakness secondary to large PCA infarction. Patient did receive TPA             -admit to inpatient rehab 2.  DVT Prophylaxis/Anticoagulation: Subcutaneous Lovenox. Monitor for any bleeding episodes 3. Pain Management: Tylenol as needed 4. Mood: Provide emotional support 5. Neuropsych: This patient is capable of making decisions on her own behalf. 6. Skin/Wound Care: Routine skin checks 7. Fluids/Electrolytes/Nutrition: Routine I&O's with follow-up chemistries 8. Hyperlipidemia. Lipitor     Post Admission Physician Evaluation: 1. Functional deficits secondary  to left PCA infarct. 2. Patient is admitted to receive collaborative, interdisciplinary care between the physiatrist, rehab nursing staff, and therapy team. 3. Patient's level of medical complexity and substantial therapy needs in context of that medical necessity cannot be provided at a lesser intensity of care such as a SNF. 4. Patient has experienced substantial functional loss from his/her baseline which was documented above under the "Functional History" and "Functional Status" headings.  Judging by the patient's diagnosis, physical exam, and functional history, the patient has potential for functional progress which will result in measurable gains while on inpatient rehab.  These gains will be of substantial and practical use upon discharge  in facilitating mobility and self-care at the household level. 5. Physiatrist will provide 24 hour management of medical needs as well as oversight of the therapy plan/treatment and provide guidance as appropriate regarding the interaction of the two. 6. The Preadmission Screening has been reviewed and patient status is unchanged unless otherwise stated above. 7. 24 hour rehab nursing will assist with bladder management, bowel management, safety, skin/wound care, disease management, medication administration,  pain management and patient education  and help integrate therapy concepts, techniques,education, etc. 8. PT will assess and treat for/with: Lower extremity strength, range of motion, stamina, balance, functional mobility,  safety, adaptive techniques and equipment, NMR, family ed.   Goals are: mod I goals. 9. OT will assess and treat for/with: ADL's, functional mobility, safety, upper extremity strength, adaptive techniques and equipment, NMR, family education.   Goals are: mod I. Therapy may proceed with showering this patient. 10. SLP will assess and treat for/with: cognition, communication, family ed.  Goals are: mod I. 11. Case Management and Social Worker will assess and treat for psychological issues and discharge planning. 12. Team conference will be held weekly to assess progress toward goals and to determine barriers to discharge. 13. Patient will receive at least 3 hours of therapy per day at least 5 days per week. 14. ELOS: 10-15 days       15. Prognosis:  excellent     Meredith Staggers, MD, [facility] 06/20/2017  Lavon Paganini [place], PA-C 06/19/2017

## 2017-06-20 NOTE — Progress Notes (Signed)
Physical Therapy Treatment Patient Details Name: [name] MRN: [id number] DOB: 1935/08/13 Today's Date: 06/20/2017    History of Present Illness 82 yo admitted after episode of dizziness with fall with right weakness and vision loss. Pt with Left PCA infarct s/p tPA. No PMHx    PT Comments    Pt with continued improved balance and gait. Pt unable to state why she cannot get up alone or what her deficits are on arrival, end of session she could state but limited carryover. Pt with improved balance without initial posterior bias in standing but certainly having to concentrate and struggle to maintain midline. Pt educated for safety, balance and gait and will continue to follow.     Follow Up Recommendations  CIR;Supervision/Assistance - 24 hour     Equipment Recommendations  Rolling walker with 5" wheels;3in1 (PT)    Recommendations for Other Services       Precautions / Restrictions Precautions Precautions: Fall Precaution Comments: right inattention, right hemianopia    Mobility  Bed Mobility               General bed mobility comments: in chair on arrival  Transfers Overall transfer level: Needs assistance   Transfers: Sit to/from Stand Sit to Stand: Supervision         General transfer comment: cues for hand placement and safety from bed and toilet with repeated trials x 4 to increase safety and controlled descent  Ambulation/Gait Ambulation/Gait assistance: Min assist Ambulation Distance (Feet): 300 Feet Assistive device: Rolling walker (2 wheeled) Gait Pattern/deviations: Step-through pattern;Decreased stride length;Narrow base of support   Gait velocity interpretation: Below normal speed for age/gender General Gait Details: pt with narrow BOS, slow gait but increased speed from last session. Pt utilizing head turns to look for room number to orient self and return to room. continues to have impaired ability to follow directional cues of right/left. Pt  without posterior bias in static stance and with gait today   Stairs            Wheelchair Mobility    Modified Rankin (Stroke Patients Only) Modified Rankin (Stroke Patients Only) Pre-Morbid Rankin Score: No symptoms Modified Rankin: Moderately severe disability     Balance Overall balance assessment: Needs assistance   Sitting balance-Leahy Scale: Good       Standing balance-Leahy Scale: Fair   Single Leg Stance - Right Leg: 2 Single Leg Stance - Left Leg: 2 Tandem Stance - Right Leg: 10 Tandem Stance - Left Leg: 10 Rhomberg - Eyes Opened: 60 Rhomberg - Eyes Closed: 10   High Level Balance Comments: pt required assist to achieve tandem stance but was able to maintain after 2nd trial, pt able to look over bil shoulders without assist, 10 sec to turn 360 degrees with minguard assist, repeated trials in narrowed BOS with increased ability with repeated trials with pt inclination to reach for RW for support with all            Cognition Arousal/Alertness: Awake/alert Behavior During Therapy: WFL for tasks assessed/performed Overall Cognitive Status: Impaired/Different from baseline Area of Impairment: Orientation;Attention;Memory;Following commands;Safety/judgement;Problem solving                 Orientation Level: Disoriented to;Time Current Attention Level: Selective   Following Commands: Follows one step commands consistently Safety/Judgement: Decreased awareness of deficits;Decreased awareness of safety     General Comments: pt able to recall room number, use room numbers to return to room. unable to recall deficits on  arrival but end of session able to state balance and vision deficits impairing function      Exercises      General Comments General comments (skin integrity, edema, etc.): sister present throughout session      Pertinent Vitals/Pain Pain Assessment: No/denies pain    Home Living                      Prior Function             PT Goals (current goals can now be found in the care plan section) Progress towards PT goals: Progressing toward goals    Frequency           PT Plan Current plan remains appropriate    Co-evaluation              AM-PAC PT "6 Clicks" Daily Activity  Outcome Measure  Difficulty turning over in bed (including adjusting bedclothes, sheets and blankets)?: A Little Difficulty moving from lying on back to sitting on the side of the bed? : A Little Difficulty sitting down on and standing up from a chair with arms (e.g., wheelchair, bedside commode, etc,.)?: A Little Help needed moving to and from a bed to chair (including a wheelchair)?: A Little Help needed walking in hospital room?: A Little Help needed climbing 3-5 steps with a railing? : A Little 6 Click Score: 18    End of Session Equipment Utilized During Treatment: Gait belt Activity Tolerance: Patient tolerated treatment well Patient left: in chair;with call bell/phone within reach;with chair alarm set;with family/visitor present Nurse Communication: Mobility status;Precautions PT Visit Diagnosis: Other abnormalities of gait and mobility (R26.89);Other symptoms and signs involving the nervous system (R29.898);Difficulty in walking, not elsewhere classified (R26.2);Unsteadiness on feet (R26.81)     Time: [id number] PT Time Calculation (min) (ACUTE ONLY): 33 min  Charges:  $Gait Training: 8-22 mins $Neuromuscular Re-education: 8-22 mins                    G Codes:       Delaney MeigsMaija Tabor Matix Henshaw, PT [phone]    Jesica Goheen B Jack Mineau 06/20/2017, 10:05 AM

## 2017-06-20 NOTE — Interval H&P Note (Signed)
History and Physical Interval Note:  06/20/2017 1:05 PM  [name]  has presented today for surgery, with the diagnosis of syncope  The various methods of treatment have been discussed with the patient and family. After consideration of risks, benefits and other options for treatment, the patient has consented to  Procedure(s): LOOP RECORDER INSERTION (N/A) as a surgical intervention .  The patient's history has been reviewed, patient examined, no change in status, stable for surgery.  I have reviewed the patient's chart and labs.  Questions were answered to the patient's satisfaction.     Hillis RangeJames Narely Nobles

## 2017-06-20 NOTE — Progress Notes (Signed)
Standley Brooking, RN  Rehab Admission Coordinator  Physical Medicine and Rehabilitation  PMR Pre-admission  Signed  Date of Service:  06/19/2017 3:03 PM       Related encounter: ED to Hosp-Admission (Current) from 06/15/2017 in [facility] NEURO/TRAUMA/SURGICAL ICU      Signed           [] Hide copied text  [] Hover for details   PMR Admission Coordinator Pre-Admission Assessment  Patient: [name] is an 82 y.o., female MRN: [id number] DOB: 1935-09-07 Height: 5\' 7"  (170.2 cm) Weight: 60.7 kg (133 lb 13.1 oz)                                                                                                                                                  Insurance Information HMO:     PPO:      PCP:      IPA:      80/20: yes     OTHER: no HMO PRIMARY: Medicare a and b      Policy#: [id number]      Subscriber: pt Eff. Date: 02/12/2003     Deduct: $1364      Out of Pocket Max: none      Life Max: none CIR: 100%      SNF: 20 full days Outpatient: 80%     Co-Pay: 20% Home Health: 100%      Co-Pay: none DME: 80%     Co-Pay: 20% Providers: pt choice  SECONDARY: none        Medicaid Application Date:       Case Manager:  Disability Application Date:       Case Worker:   Emergency Contact Information        Contact Information    Name Relation Home Work Bixby R Sister [phone]     Lamar Sprinkles Daughter   [phone]     Current Medical History  Patient Admitting Diagnosis: Left PCA infarct  History of Present Illness:  Billijo Dilling Terrellis a 82 y.o.right handed femalewith unremarkable past medical history on no prescription medications. Presented 06/15/2017 was sudden onset of dizziness with right-sided weakness, slurred speech,blurred visionand fall. Cranial CT scan negative. Patient did receive TPA. CT cerebral perfusion scan as well as CT angiogram head and neck showed left P2 segment occlusion. 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 Echocardiogram with ejection fraction of 70% no wall motion abnormalities.Follow-up CT of the head 06/18/2017 showed some increased edema and mass effect associated with left PCA infarction and continue to monitor.Neurology follow-upand currently maintained on aspirin for CVA prophylaxis. Subcutaneous Lovenox for DVT prophylaxis.TEEshowed ejection fraction 65%. Negative bubble study. No thrombusand loop recorder placement 06/20/2017. Maintained on a regular diet.  Total: 1 NIHSS  Past Medical History      Past Medical History:  Diagnosis Date  . Stroke [facility])     Family History  family history includes Breast cancer in her brother and sister; Heart attack in her father.  Prior Rehab/Hospitalizations:  Has the patient had major surgery during 100 days prior to admission? No  Current Medications   Current Facility-Administered Medications:  .  acetaminophen (TYLENOL) tablet 650 mg, 650 mg, Oral, Q4H PRN **OR** [DISCONTINUED] acetaminophen (TYLENOL) solution 650 mg, 650 mg, Per Tube, Q4H PRN **OR** [DISCONTINUED] acetaminophen (TYLENOL) suppository 650 mg, 650 mg, Rectal, Q4H PRN, Caryl Pina, MD .  aspirin EC tablet 325 mg, 325 mg, Oral, Daily, Marvel Plan, MD, 325 mg at 06/19/17 2112 .  atorvastatin (LIPITOR) tablet 20 mg, 20 mg, Oral, q1800, Marvel Plan, MD, 20 mg at 06/19/17 1719 .  cyanocobalamin ((VITAMIN B-12)) injection 1,000 mcg, 1,000 mcg, Intramuscular, Daily, Costello, Mary A, NP .  enoxaparin (LOVENOX) injection 40 mg, 40 mg, Subcutaneous, Q24H, Marvel Plan, MD, 40 mg at 06/20/17 0738 .  labetalol (NORMODYNE,TRANDATE) injection 10 mg, 10 mg, Intravenous, Q2H PRN, Marvel Plan, MD .  pantoprazole (PROTONIX) EC tablet 40 mg, 40 mg, Oral, Daily, Marvel Plan, MD, 40 mg at 06/20/17 1039 .  senna-docusate (Senokot-S) tablet 1 tablet, 1 tablet, Oral, QHS PRN, Caryl Pina, MD  Patients Current Diet: Diet regular  Room service appropriate? 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 None of the above  Prior Functional Level Prior Function Level of Independence: Independent  Self Care: Did the patient need help bathing, dressing, using the toilet or eating?  Independent  Indoor Mobility: Did the patient need assistance with walking from room to room (with or without device)? Independent  Stairs: Did the patient need assistance with internal or external stairs (with or without device)? Independent  Functional Cognition: Did the patient need help planning regular tasks such as shopping or remembering to take medications? Independent  Current Functional Level Cognition  Arousal/Alertness: Awake/alert Overall Cognitive Status: Impaired/Different from baseline Current Attention Level: Selective Orientation Level: Oriented to person, Oriented to place, Oriented to time, Oriented to situation Following Commands: Follows one step commands consistently Safety/Judgement: Decreased awareness of deficits, Decreased awareness of safety General Comments: pt able to recall room number, use room numbers to return to room. unable to recall deficits on arrival but end of session able to state balance and vision deficits impairing function Attention: Focused, Sustained Focused Attention: Appears intact Sustained Attention: Appears intact Memory: Impaired Memory  Impairment: Decreased recall of new information Awareness: Impaired Awareness Impairment: Intellectual impairment, Emergent impairment, Anticipatory impairment Problem Solving: Impaired Problem Solving Impairment: Verbal basic, Functional basic Behaviors: Restless, Impulsive Safety/Judgment: Impaired    Extremity Assessment (includes Sensation/Coordination)  Upper Extremity Assessment: RUE deficits/detail RUE Deficits / Details: Grossly 3/5, impaired coordination during functional tasks, poor awareness of position in space RUE Sensation: decreased light touch, decreased proprioception RUE Coordination: decreased fine motor, decreased gross motor  Lower Extremity Assessment: Defer to PT evaluation RLE Deficits / Details: pt with functional strength, not formally assessed with decreaesed proprioception and awareness of position  RLE Sensation: decreased proprioception    ADLs  Overall ADL's : Needs assistance/impaired Grooming: Oral care, Set up, Supervision/safety, Sitting, Wash/dry face, Min guard, Standing, Brushing hair Grooming Details (indicate cue type and reason): Pt washing her hands and brushing hair at sink while standing with Min Guard A for safety and  Min VCs for locating items on R side. Pt performing oral care while seated in recliner with set up. Providing education on visual anchor technique to optimize independence during ADLs. Pt continues to present with decreased attention to R as well as R visual field cut.  Upper Body Bathing: Sitting, Moderate assistance Lower Body Bathing: Moderate assistance, Sit to/from stand Upper Body Dressing : Moderate assistance, Sitting Lower Body Dressing: Sit to/from stand, Minimal assistance Lower Body Dressing Details (indicate cue type and reason): Pt able to reach down and adjust socks with Min A for safety and sitting balance. Pt continues to demosntrate poor balance with signfiicant posterior lean Toilet Transfer: Moderate  assistance, +2 for safety/equipment, Minimal assistance, Ambulation, Regular Toilet, RW Toilet Transfer Details (indicate cue type and reason): Pt requiring increased cues for sequencing and safety. Mod A for safety descent to toilet and then Min A for sit>stand Toileting- Clothing Manipulation and Hygiene: Min guard, Sitting/lateral lean Toileting - Clothing Manipulation Details (indicate cue type and reason): Min Guard for safety during lateral leans Functional mobility during ADLs: Minimal assistance, +2 for safety/equipment, Rolling walker, Cueing for sequencing, Cueing for safety(Cues to attending to R side) General ADL Comments: Pt demosntrating increased fucnitonal performance.However, contineus to be limited by decreased fucntional use of RUE, vision, and balance.     Mobility  Overal bed mobility: Needs Assistance Bed Mobility: Supine to Sit Supine to sit: Supervision General bed mobility comments: in chair on arrival    Transfers  Overall transfer level: Needs assistance Equipment used: Rolling walker (2 wheeled) Transfers: Sit to/from Stand Sit to Stand: Supervision General transfer comment: cues for hand placement and safety from bed and toilet with repeated trials x 4 to increase safety and controlled descent    Ambulation / Gait / Stairs / Wheelchair Mobility  Ambulation/Gait Ambulation/Gait assistance: ArchitectMin assist Ambulation Distance (Feet): 300 Feet Assistive device: Rolling walker (2 wheeled) Gait Pattern/deviations: Step-through pattern, Decreased stride length, Narrow base of support General Gait Details: pt with narrow BOS, slow gait but increased speed from last session. Pt utilizing head turns to look for room number to orient self and return to room. continues to have impaired ability to follow directional cues of right/left. Pt without posterior bias in static stance and with gait today Gait velocity interpretation: Below normal speed for age/gender      Posture / Balance Dynamic Sitting Balance Sitting balance - Comments: posterior lean with cues to return to midline and plant feet on the ground Static Standing Balance Single Leg Stance - Right Leg: 2 Single Leg Stance - Left Leg: 2 Tandem Stance - Right Leg: 10 Tandem Stance - Left Leg: 10 Rhomberg - Eyes Opened: 60 Rhomberg - Eyes Closed: 10 Balance Overall balance assessment: Needs assistance Sitting-balance support: Feet supported Sitting balance-Leahy Scale: Good Sitting balance - Comments: posterior lean with cues to return to midline and plant feet on the ground Postural control: Posterior lean Standing balance support: Bilateral upper extremity supported Standing balance-Leahy Scale: Fair Standing balance comment: required bil UE support to maintain position in standing Single Leg Stance - Right Leg: 2 Single Leg Stance - Left Leg: 2 Tandem Stance - Right Leg: 10  Tandem Stance - Left Leg: 10 Rhomberg - Eyes Opened: 60 Rhomberg - Eyes Closed: 10 High Level Balance Comments: pt required assist to achieve tandem stance but was able to maintain after 2nd trial, pt able to look over bil shoulders without assist, 10 sec to turn 360 degrees with minguard assist, repeated trials in narrowed BOS with increased ability with repeated trials with pt inclination to reach for RW for support with all    Special needs/care consideration BiPAP/CPAP  N/a CPM  N/a Continuous Drip IV  N/a Dialysis  N/a Life Vest  N/a Oxygen  N/a Special Bed  N/a Trach Size  N/a Wound Vac n/a Skin ecchymosis BUE Bowel mgmt: continent LBM 06/17/17  Bladder mgmt:continent Diabetic mgmt  N/a   Previous Home Environment Living Arrangements: Alone  Lives With: Alone Available Help at Discharge: Available PRN/intermittently(great niece next door but works. sister prn) Type of Home: House Home Layout: Two level, Laundry or work area in basement, Able to live on main level with bedroom/bathroom Home  Access: Stairs to enter Entrance Stairs-Rails: Right Entrance Stairs-Number of Steps: 4 to 5 Bathroom Shower/Tub: Health visitor: Standard Bathroom Accessibility: Yes How Accessible: Accessible via walker Home Care Services: No  Discharge Living Setting Plans for Discharge Living Setting: Patient's home, Alone Type of Home at Discharge: House Discharge Home Layout: Laundry or work area in basement, Two level Discharge Home Access: Stairs to enter Entrance Stairs-Rails: Right Entrance Stairs-Number of Steps: 4 to 5 Discharge Bathroom Shower/Tub: Walk-in shower Discharge Bathroom Toilet: Standard Discharge Bathroom Accessibility: Yes How Accessible: Accessible via walker Does the patient have any problems obtaining your medications?: No  Social/Family/Support Systems Patient Roles: Parent, 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) Solicitor Information: French Ana, daughter Anticipated Caregiver: family intermittently Anticipated Caregiver's Contact Information: see above Caregiver Availability: Intermittent Discharge Plan Discussed with Primary Caregiver: Yes Is Caregiver In Agreement with Plan?: Yes Does Caregiver/Family have Issues with Lodging/Transportation while Pt is in Rehab?: No  Goals/Additional Needs Patient/Family Goal for Rehab: Mod I to supervision with PT, OT, and SLP Expected length of stay: [facility] 10-15 days, but pt wanting to d/c asap Pt/Family Agrees to Admission and willing to participate: Yes Program Orientation Provided & Reviewed with Pt/Caregiver Including Roles  & Responsibilities: Yes  Decrease burden of Care through IP rehab admission: n/a  Possible need for SNF placement upon discharge: not anticipated  Patient Condition: This patient's medical and functional status has changed since the consult dated: 06/16/2017 in which the Rehabilitation Physician determined and documented that the patient's condition is  appropriate for intensive rehabilitative care in an inpatient rehabilitation facility. See "History of Present Illness" (above) for medical update. Functional changes are: min assist. Patient's medical and functional status update has been discussed with the Rehabilitation physician and patient remains appropriate for inpatient rehabilitation. Will admit to inpatient rehab today.  Preadmission Screen Completed By:  Clois Dupes, 06/20/2017 11:02 AM ______________________________________________________________________   Discussed status with Dr. Riley Kill on 06/20/2017 at  1103 and received telephone approval for admission today.  Admission Coordinator:  Clois Dupes, time 8469 Date 06/20/2017             Cosigned by: Ranelle Oyster, MD at 06/20/2017 11:09 AM  Revision History

## 2017-06-20 NOTE — Care Management Note (Signed)
Case Management Note  Patient Details  Name: Angela SheererReva R Gilbert MRN: [id number] Date of Birth: 05/28/1936  Subjective/Objective: 82 yo admitted after episode of dizziness with fall with right weakness and vision loss. Pt with Left PCA infarct s/p TPA.  PTA, pt resided at home alone, but has family available to assist 24/7 at dc.                     Action/Plan: PT/OT recommending CIR, and bed available today.  Plan dc to [facility] IP rehab later today.    Expected Discharge Date:  06/20/17               Expected Discharge Plan:  IP Rehab Facility  In-House Referral:     Discharge planning Services  CM Consult  Post Acute Care Choice:    Choice offered to:     DME Arranged:    DME Agency:     HH Arranged:    HH Agency:     Status of Service:  Completed, signed off  If discussed at [employer] Meetings, dates discussed:    Additional Comments:  Quintella BatonJulie W. Sukhman Martine, RN, BSN  Trauma/Neuro ICU Case Manager [phone]

## 2017-06-21 ENCOUNTER — Inpatient Hospital Stay (HOSPITAL_COMMUNITY): Payer: Medicare Other | Admitting: Speech Pathology

## 2017-06-21 ENCOUNTER — Inpatient Hospital Stay (HOSPITAL_COMMUNITY): Payer: Medicare Other | Admitting: Occupational Therapy

## 2017-06-21 ENCOUNTER — Inpatient Hospital Stay (HOSPITAL_COMMUNITY): Payer: Medicare Other | Admitting: Physical Therapy

## 2017-06-21 DIAGNOSIS — R269 Unspecified abnormalities of gait and mobility: Secondary | ICD-10-CM

## 2017-06-21 DIAGNOSIS — I69320 Aphasia following cerebral infarction: Secondary | ICD-10-CM

## 2017-06-21 DIAGNOSIS — I69398 Other sequelae of cerebral infarction: Secondary | ICD-10-CM

## 2017-06-21 DIAGNOSIS — I69390 Apraxia following cerebral infarction: Secondary | ICD-10-CM

## 2017-06-21 LAB — CBC WITH DIFFERENTIAL/PLATELET
Basophils Absolute: 0 10*3/uL (ref 0.0–0.1)
Basophils Relative: 0 %
EOS PCT: 4 %
Eosinophils Absolute: 0.2 10*3/uL (ref 0.0–0.7)
HCT: 38.7 % (ref 36.0–46.0)
Hemoglobin: 12.7 g/dL (ref 12.0–15.0)
LYMPHS ABS: 1.2 10*3/uL (ref 0.7–4.0)
Lymphocytes Relative: 23 %
MCH: 31.3 pg (ref 26.0–34.0)
MCHC: 32.8 g/dL (ref 30.0–36.0)
MCV: 95.3 fL (ref 78.0–100.0)
MONO ABS: 0.4 10*3/uL (ref 0.1–1.0)
MONOS PCT: 7 %
Neutro Abs: 3.5 10*3/uL (ref 1.7–7.7)
Neutrophils Relative %: 66 %
PLATELETS: 179 10*3/uL (ref 150–400)
RBC: 4.06 MIL/uL (ref 3.87–5.11)
RDW: 12.5 % (ref 11.5–15.5)
WBC: 5.3 10*3/uL (ref 4.0–10.5)

## 2017-06-21 LAB — COMPREHENSIVE METABOLIC PANEL
ALT: 21 U/L (ref 14–54)
AST: 26 U/L (ref 15–41)
Albumin: 3.6 g/dL (ref 3.5–5.0)
Alkaline Phosphatase: 68 U/L (ref 38–126)
Anion gap: 9 (ref 5–15)
BILIRUBIN TOTAL: 1 mg/dL (ref 0.3–1.2)
BUN: 19 mg/dL (ref 6–20)
CO2: 25 mmol/L (ref 22–32)
Calcium: 9.3 mg/dL (ref 8.9–10.3)
Chloride: 105 mmol/L (ref 101–111)
Creatinine, Ser: 0.82 mg/dL (ref 0.44–1.00)
Glucose, Bld: 120 mg/dL — ABNORMAL HIGH (ref 65–99)
POTASSIUM: 3.6 mmol/L (ref 3.5–5.1)
Sodium: 139 mmol/L (ref 135–145)
TOTAL PROTEIN: 6.2 g/dL — AB (ref 6.5–8.1)

## 2017-06-21 NOTE — Evaluation (Signed)
Physical Therapy Assessment and Plan  Patient Details  Name: [name] MRN: [id number] Date of Birth: 1935/10/06  PT Diagnosis: Abnormality of gait, Ataxia, Ataxic gait and Hemiplegia non-dominant Rehab Potential: Excellent ELOS: 8-10 days    Today's Date: 06/21/2017 PT Individual Time: 0915-1030 PT Individual Time Calculation (min): 75 min    Problem List:  Patient Active Problem List   Diagnosis Date Noted  . Cerebrovascular accident (CVA) due to occlusion of left posterior communicating artery ([place]) 06/20/2017  . Stroke (cerebrum) ([place]) 06/15/2017    Past Medical History:  Past Medical History:  Diagnosis Date  . Stroke [facility])    Past Surgical History:  Past Surgical History:  Procedure Laterality Date  . LOOP RECORDER INSERTION N/A 06/20/2017   Procedure: LOOP RECORDER INSERTION;  Surgeon: Thompson Grayer, MD;  Location: [place] CV LAB;  Service: Cardiovascular;  Laterality: N/A;  . TEE WITHOUT CARDIOVERSION N/A 06/19/2017   Procedure: TRANSESOPHAGEAL ECHOCARDIOGRAM (TEE) WITH LOOP;  Surgeon: Angela Pain, MD;  Location: [facility] ENDOSCOPY;  Service: Cardiovascular;  Laterality: N/A;    Assessment & Plan Clinical Impression: Patient is a 82 y.o.right handed femalewith unremarkable past medical history on no prescription medications. Per chart review patient lives alone independent prior to admission. Two-level home with bedroom and bathroom on first floor.Family live in the area questioned assistance on discharge.[facility]ed [facility]to [facility] withsudden onset of dizziness with right-sided weakness, slurred speech,blurred visionand fall. Cranial CT scan negative. Patient did receive TPA. CT cerebral perfusion scan as well as CT angiogram head and neck showed left P2 segment occlusion. 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 Echocardiogram with ejection fraction of 70% no wall motion abnormalities.Follow-up CT  of the head 06/18/2017 showed some increased edema and mass effect associated with left PCA infarction and continue to monitor.Neurology follow-upand currently maintained on aspirin for CVA prophylaxis.    Patient transferred to CIR on 06/20/2017 .   Patient currently requires min with mobility secondary to muscle weakness, motor apraxia, ataxia, decreased coordination and decreased motor planning, decreased visual perceptual skills, decreased attention to left and ideational apraxia and decreased standing balance, decreased postural control, hemiplegia and decreased balance strategies.  Prior to hospitalization, patient was independent  with mobility and lived with Alone in a House home.  Home access is 4Stairs to enter.  Patient will benefit from skilled PT intervention to maximize safe functional mobility, minimize fall risk and decrease caregiver burden for planned discharge home with intermittent assist.  Anticipate patient will benefit from follow up [facility] at discharge.  PT - End of Session Activity Tolerance: Tolerates 30+ min activity with multiple rests Endurance Deficit: Yes PT Assessment Rehab Potential (ACUTE/IP ONLY): Excellent PT Barriers to Discharge: Decreased caregiver support;Home environment access/layout PT Patient demonstrates impairments in the following area(s): Balance;Behavior;Endurance;Motor;Nutrition;Perception;Safety;Sensory;Skin Integrity PT Transfers Functional Problem(s): Bed Mobility;Bed to Chair;Car;Furniture;Floor PT Locomotion Functional Problem(s): Ambulation;Stairs;Wheelchair Mobility PT Plan PT Intensity: Minimum of 1-2 x/day ,45 to 90 minutes PT Frequency: 5 out of 7 days PT Duration Estimated Length of Stay: 8-10 days  PT Treatment/Interventions: Ambulation/gait training;Cognitive remediation/compensation;DME/adaptive equipment instruction;Community reintegration;Balance/vestibular training;Functional electrical stimulation;Disease  management/prevention;Discharge planning;Functional mobility training;Neuromuscular re-education;Gilbert management;Patient/family education;Psychosocial support;Skin care/wound management;Splinting/orthotics;Stair training;Therapeutic Exercise;Therapeutic Activities;UE/LE Strength taining/ROM;UE/LE Coordination activities;Wheelchair propulsion/positioning;Visual/perceptual remediation/compensation PT Transfers Anticipated Outcome(s): Mod I with LRAD  PT Locomotion Anticipated Outcome(s): Mod I with LRAD at househol distances.  PT Recommendation Follow Up Recommendations: Outpatient PT;Home health PT Patient destination: Home Equipment Recommended: To be determined;Cane  Skilled Therapeutic Intervention Pt  received sitting in WC and agreeable to PT. PT instructed patient in PT Evaluation and initiated treatment intervention; see below for results. PT educated patient in [place], rehab potential, rehab goals, and discharge recommendations. Patient demonstrates increased fall risk as noted by score of   46/56 on Berg Balance Scale.  (<36= high risk for falls, close to 100%; 37-45 significant >80%; 46-51 moderate >50%; 52-55 lower >25%)  Patient returned to room and left sitting in [facility] with call bell in reach and all needs met.       PT Evaluation Precautions/Restrictions Precautions Precautions: Fall Precaution Comments: right inattention, right hemianopia General   Vital Signs  Gilbert Gilbert Assessment Gilbert Assessment: No/denies Gilbert Gilbert Score: 0-No Gilbert Home Living/Prior Functioning Home Living Available Help at Discharge: Family;Available 24 hours/day Type of Home: House Home Access: Stairs to enter [employer] of Steps: 4 Entrance Stairs-Rails: Left Home Layout: Two level;Laundry or work area in basement;Able to live on main level with bedroom/bathroom Bathroom Shower/Tub: Multimedia programmer: Standard  Lives With: Alone Prior Function Level of Independence:  Independent with basic ADLs;Independent with homemaking with ambulation  Able to Take Stairs?: Yes Driving: Yes Vocation: Retired Leisure: Hobbies-yes (Comment) Comments: senior games. table tennis, batchi, pickle ball Vision/Perception  Vision - Assessment Eye Alignment: Within Functional Limits Ocular Range of Motion: Within Functional Limits Perception Perception: Impaired Inattention/Neglect: Does not attend to right visual field;Does not attend to right side of body Praxis Praxis: Impaired Praxis Impairment Details: Motor planning  Cognition Overall Cognitive Status: Impaired/Different from baseline Orientation Level: Oriented to person;Oriented to place Focused Attention: Appears intact Sustained Attention: Impaired Memory: Impaired Memory Impairment: Retrieval deficit Awareness: Impaired Safety/Judgment: Impaired Sensation Sensation Light Touch: Impaired by gross assessment(Mild parathesia on the RLE) Proprioception: Impaired by gross assessment(RUE and RLE deficits with ataxia) Coordination Gross Motor Movements are Fluid and Coordinated: No Fine Motor Movements are Fluid and Coordinated: No Coordination and Movement Description: Ataxia in the RUE and RLE Finger Nose Finger Test: Ataxic RUE Heel Shin Test: Ataxic RLE Motor  Motor Motor: Hemiplegia;Ataxia Motor - Skilled Clinical Observations: mild R sided weakness. RUE and RLE ataxia   Mobility Bed Mobility Bed Mobility: Rolling Right;Rolling Left;Sit to Supine;Supine to Sit Rolling Right: 5: Supervision Rolling Left: 5: Supervision Supine to Sit: 5: Supervision Sit to Supine: 5: Supervision Transfers Transfers: Yes Sit to Stand: 4: Min assist Sit to Stand Details: Verbal cues for precautions/safety Stand Pivot Transfers: 4: Min assist Stand Pivot Transfer Details: Verbal cues for precautions/safety Locomotion  Ambulation Ambulation: Yes Ambulation Distance (Feet): 200 Feet Assistive device:  None Gait Gait: Yes Gait Pattern: Impaired Gait Pattern: Ataxic;Narrow base of support High Level Ambulation High Level Ambulation: Other high level ambulation High Level Ambulation - Other Comments: Uneven surface and up/down ramp x 42f with min assist  Stairs / Additional Locomotion Stairs: Yes Stairs Assistance: 4: Min assist Stair Management Technique: One rail Left Number of Stairs: 12 Height of Stairs: 6 Ramp: 4: Min aChemical engineer Yes Wheelchair Assistance: 5: SCareers information officer Both upper extremities Wheelchair Parts Management: Needs assistance Distance: 2036f   Balance Balance Balance Assessed: Yes Standardized Balance Assessment Standardized Balance Assessment: BeFurniture conservator/restorererg Balance Test Sit to Stand: Able to stand without using hands and stabilize independently Standing Unsupported: Able to stand safely 2 minutes Sitting with Back Unsupported but Feet Supported on Floor or Stool: Able to sit safely and securely 2 minutes Stand to Sit: Sits safely with minimal use  of hands Transfers: Able to transfer safely, definite need of hands Standing Unsupported with Eyes Closed: Able to stand 10 seconds with supervision Standing Ubsupported with Feet Together: Able to place feet together independently and stand for 1 minute with supervision From Standing, Reach Forward with Outstretched Arm: Can reach confidently >25 cm (10") From Standing Position, Pick up Object from Floor: Able to pick up shoe, needs supervision From Standing Position, Turn to Look Behind Over each Shoulder: Looks behind one side only/other side shows less weight shift Turn 360 Degrees: Able to turn 360 degrees safely but slowly Standing Unsupported, Alternately Place Feet on Step/Stool: Able to stand independently and complete 8 steps >20 seconds Standing Unsupported, One Foot in Front: Able to plae foot ahead of the other independently and hold  30 seconds Standing on One Leg: Able to lift leg independently and hold 5-10 seconds Total Score: 46 Static Sitting Balance Static Sitting - Level of Assistance: 6: Modified independent (Device/Increase time) Dynamic Sitting Balance Dynamic Sitting - Level of Assistance: 6: Modified independent (Device/Increase time) Static Standing Balance Static Standing - Level of Assistance: 5: Stand by assistance Dynamic Standing Balance Dynamic Standing - Level of Assistance: 4: Min assist Extremity Assessment      RLE Assessment RLE Assessment: Exceptions to [facility] RLE Strength RLE Overall Strength Comments: 4+/5 to 5/5 proximal to distal.  LLE Assessment LLE Assessment: Within Functional Limits   See Function Navigator for Current Functional Status.   Refer to Care Plan for Long Term Goals  Recommendations for other services: Therapeutic Recreation  Pet therapy, Stress management and Outing/community reintegration  Discharge Criteria: Patient will be discharged from PT if patient refuses treatment 3 consecutive times without medical reason, if treatment goals not met, if there is a change in medical status, if patient makes no progress towards goals or if patient is discharged from hospital.  The above assessment, treatment plan, treatment alternatives and goals were discussed and mutually agreed upon: by patient  Lorie Phenix 06/21/2017, 11:04 AM

## 2017-06-21 NOTE — Evaluation (Signed)
Speech Language Pathology Assessment and Plan  Patient Details  Name: [name] MRN: [id number] Date of Birth: [birth year]  SLP Diagnosis: Cognitive Impairments  Rehab Potential: Excellent ELOS: 7 days    Today's Date: 06/21/2017 SLP Individual Time: 1100-1200 SLP Individual Time Calculation (min): 60 min   Problem List:  Patient Active Problem List   Diagnosis Date Noted  . Cerebrovascular accident (CVA) due to occlusion of left posterior communicating artery ([place]) 06/20/2017  . Stroke (cerebrum) ([place]) 06/15/2017   Past Medical History:  Past Medical History:  Diagnosis Date  . Stroke [facility])    Past Surgical History:  Past Surgical History:  Procedure Laterality Date  . LOOP RECORDER INSERTION N/A 06/20/2017   Procedure: LOOP RECORDER INSERTION;  Surgeon: Thompson Grayer, MD;  Location: [place] CV LAB;  Service: Cardiovascular;  Laterality: N/A;  . TEE WITHOUT CARDIOVERSION N/A 06/19/2017   Procedure: TRANSESOPHAGEAL ECHOCARDIOGRAM (TEE) WITH LOOP;  Surgeon: Jerline Pain, MD;  Location: [place] ENDOSCOPY;  Service: Cardiovascular;  Laterality: N/A;    Assessment / Plan / Recommendation Clinical Impression [name] a 82 y.o.right handed femalewith unremarkable past medical history on no prescription medications. Per chart review patient lives alone independent prior to admission. Two-level home with bedroom and bathroom on first floor.Family live in the area questioned assistance on discharge.[facility]to [facility] withsudden onset of dizziness with right-sided weakness, slurred speech,blurred visionand fall. Cranial CT scan negative. Patient did receive TPA. CT cerebral perfusion scan as well as CT angiogram head and neck showed left P2 segment occlusion. 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 Echocardiogram with ejection fraction of 70% no wall motion abnormalities.Follow-up CT of the head  06/18/2017 showed some increased edema and mass effect associated with left PCA infarction and continue to monitor.Neurology follow-upand currently maintained on aspirin for CVA prophylaxis. Subcutaneous Lovenox for DVT prophylaxis.TEEshowed ejection fraction 65%. Negative bubble study. No thrombusnoted.Maintained on a regular diet.Physical and occupational therapy evaluation completed 06/16/2017 with recommendations of physical medicine rehabilitation consult. Patient was admitted for a comprehensive rehabilitation program on 06/20/17.   Comprehensive cognitive linguistic evaluation completed on 06/21/17. Pt presents with mild to moderate cognitive impairments impacting delayed recall of information, safety awareness (both emergent and anticipatory) and semi-complex problem solving. Attempted MOCA version 8.2 but unable to obtain score d/t inability to complete top portion as result of visual deficits. Pt with visual/inattention deficits to right field of vision. During this evaluation, pt presented with fluent expressive abilities (able to name over 15 words in minute) and demonstrated comprehension of all tasks however ongoing diagnostic testing will be helpful with creating discharge planning. Skilled ST is recommended to address the above mentioned deficits and increase functional independence while reducing caregiver burden. Recommend that pt have 24 hour supervision with follow up OutPatient ST services at discharge.    Skilled Therapeutic Interventions          Skilled treatment session focused on completion of cognitive linguistic evaluation, see above. Daughter present during evaluation and cognitive deficits listed with POC discussed. Education provided that pt will likely need 24 hour supervision at discharge d/t cognitive deficits. Although pt stated that she understood, pt's further comments didn't demonstrate understanding.    SLP Assessment  Patient will need skilled [facility] Services during CIR admission    Recommendations  Patient destination: Home Follow up Recommendations: Outpatient SLP;24 hour supervision/assistance Equipment Recommended: None recommended by SLP    SLP Frequency 3 to 5 out  of 7 days   SLP Duration  SLP Intensity  SLP Treatment/Interventions 7 days  Minumum of 1-2 x/day, 30 to 90 minutes  Cognitive remediation/compensation;Patient/family education;Medication managment;Therapeutic Activities;Functional tasks    Pain Pain Assessment Pain Assessment: No/denies pain Pain Score: 0-No pain  Prior Functioning Cognitive/Linguistic Baseline: Within functional limits Type of Home: House  Lives With: Alone Available Help at Discharge: Family;Available 24 hours/day Vocation: Retired  Function:  Eating Eating   Modified Consistency Diet: No Eating Assist Level: More than reasonable amount of time           Cognition Comprehension Comprehension assist level: Follows basic conversation/direction with extra time/assistive device;Follows basic conversation/direction with no assist  Expression   Expression assist level: Expresses basic needs/ideas: With no assist;Expresses basic needs/ideas: With extra time/assistive device  Social Interaction Social Interaction assist level: Interacts appropriately with others with medication or extra time (anti-anxiety, antidepressant).  Problem Solving Problem solving assist level: Solves basic 90% of the time/requires cueing < 10% of the time;Solves basic 75 - 89% of the time/requires cueing 10 - 24% of the time  Memory Memory assist level: Recognizes or recalls 75 - 89% of the time/requires cueing 10 - 24% of the time;Recognizes or recalls 50 - 74% of the time/requires cueing 25 - 49% of the time   Short Term Goals: Week 1: SLP Short Term Goal 1 (Week 1): Pt will utilize external memory aids to recall semi-complex daily information with supervision cues.  SLP Short Term Goal 2 (Week  1): Pt will locate items to right of midline with supervision cues.  SLP Short Term Goal 3 (Week 1): Pt will demonstrate anticipatory awareness by listing 3 activities that are safe to participate in at home with supervision cues.  SLP Short Term Goal 4 (Week 1): Pt will complete semi-complex medication management tasks with supervision cues.  SLP Short Term Goal 5 (Week 1): Pt will complete semi-complex money management tasks with supervision cues.   Refer to Care Plan for Long Term Goals  Recommendations for other services: None   Discharge Criteria: Patient will be discharged from SLP if patient refuses treatment 3 consecutive times without medical reason, if treatment goals not met, if there is a change in medical status, if patient makes no progress towards goals or if patient is discharged from hospital.  The above assessment, treatment plan, treatment alternatives and goals were discussed and mutually agreed upon: by patient and by family  Dodie Parisi 06/21/2017, 12:48 PM

## 2017-06-21 NOTE — Evaluation (Signed)
Occupational Therapy Assessment and Plan  Patient Details  Name: [name] MRN: [id number] Date of Birth: 16-Mar-1936  OT Diagnosis: apraxia, ataxia, cognitive deficits, disturbance of vision, hemiplegia affecting dominant side and muscle weakness (generalized) Rehab Potential: Rehab Potential (ACUTE ONLY): Excellent ELOS: 7 days   Today's Date: 06/21/2017 OT Individual Time: 1300-1400 OT Individual Time Calculation (min): 60 min     Problem List:  Patient Active Problem List   Diagnosis Date Noted  . Cerebrovascular accident (CVA) due to occlusion of left posterior communicating artery ([place]) 06/20/2017  . Stroke (cerebrum) ([place]) 06/15/2017    Past Medical History:  Past Medical History:  Diagnosis Date  . Stroke [facility])    Past Surgical History:  Past Surgical History:  Procedure Laterality Date  . LOOP RECORDER INSERTION N/A 06/20/2017   Procedure: LOOP RECORDER INSERTION;  Surgeon: Thompson Grayer, MD;  Location: [place] CV [facility];  Service: Cardiovascular;  Laterality: N/A;  . TEE WITHOUT CARDIOVERSION N/A 06/19/2017   Procedure: TRANSESOPHAGEAL ECHOCARDIOGRAM (TEE) WITH LOOP;  Surgeon: Jerline Pain, MD;  Location: [place] [facility];  Service: Cardiovascular;  Laterality: N/A;    Assessment & Plan Clinical Impression: Angela R Terrellis a 82 y.o.right handed femalewith unremarkable past medical history on no prescription medications. Per chart review patient lives alone independent prior to admission. Two-level home with bedroom and bathroom on first floor.Family live in the area questioned assistance on discharge.[facility] withsudden onset of dizziness with right-sided weakness, slurred speech,blurred visionand fall. Cranial CT scan negative. Patient did receive TPA. CT cerebral perfusion scan as well as CT angiogram head and neck showed left P2 segment occlusion. MRI moderate sized acute ischemic nonhemorrhagic left PCA territory  infarction. Echocardiogram with ejection fraction of 70% no wall motion abnormalities.Follow-up CT of the head 06/18/2017 showed some increased edema and mass effect associated with left PCA infarction and continue to monitor.Neurology follow-upand currently maintained on aspirin for CVA prophylaxis. Subcutaneous Lovenox for DVT prophylaxis.TEEshowed ejection fraction 65%. Negative bubble study. No thrombusnoted.Maintained on a regular diet.Physical and occupational therapy evaluation completed 06/16/2017 with recommendations of physical medicine rehabilitation consult. Patient was admitted for a comprehensive rehabilitation program.  Patient transferred to CIR on 06/20/2017 .    Patient currently requires min with basic self-care skills secondary to muscle weakness, decreased cardiorespiratoy endurance, motor apraxia, ataxia and decreased coordination, decreased visual acuity and hemianopsia, decreased attention, decreased awareness, decreased problem solving, decreased safety awareness, decreased memory and delayed processing and decreased standing balance, decreased postural control, hemiplegia and decreased balance strategies.  Prior to hospitalization, patient could complete ADLs/IADLs with independent .  Patient will benefit from skilled intervention to decrease level of assist with basic self-care skills, increase independence with basic self-care skills and increase level of independence with iADL prior to discharge home independently.  Anticipate patient will require intermittent supervision and follow up home health and follow up outpatient.  OT - End of Session Activity Tolerance: Tolerates 10 - 20 min activity with multiple rests Endurance Deficit: Yes OT Assessment Rehab Potential (ACUTE ONLY): Excellent OT Patient demonstrates impairments in the following area(s): Balance;Vision;Cognition;Perception;Safety;Endurance;Sensory OT Basic ADL's Functional Problem(s):  Eating;Grooming;Bathing;Dressing;Toileting OT Advanced ADL's Functional Problem(s): Simple Meal Preparation;Light Housekeeping;Laundry OT Transfers Functional Problem(s): Toilet;Tub/Shower OT Plan OT Intensity: Minimum of 1-2 x/day, 45 to 90 minutes OT Frequency: 5 out of 7 days OT Duration/Estimated Length of Stay: 7 days OT Treatment/Interventions: Balance/vestibular training;Community reintegration;Disease mangement/prevention;Neuromuscular re-education;Patient/family education;Self Care/advanced ADL retraining;Splinting/orthotics;Therapeutic Exercise;UE/LE Coordination activities;Visual/perceptual remediation/compensation;UE/LE Strength taining/ROM;Therapeutic Activities;Psychosocial support;Functional mobility  training;DME/adaptive equipment instruction;Discharge planning;Cognitive remediation/compensation OT Self Feeding Anticipated Outcome(s): Mod I OT Basic Self-Care Anticipated Outcome(s): Mod I OT Toileting Anticipated Outcome(s): Mod I OT Bathroom Transfers Anticipated Outcome(s): Mod I OT Recommendation Recommendations for Other Services: Neuropsych consult;Therapeutic Recreation consult Therapeutic Recreation Interventions: Pet therapy;Kitchen group;Stress management;Outing/community reintergration Patient destination: Home Follow Up Recommendations: Home health OT;Outpatient OT Equipment Recommended: To be determined   Skilled Therapeutic Intervention Pt seen for OT eval and ADL bathing/dressing session. Pt sitting up in w/c upon arrival with daughter and niece also present. Family members left during session. Pt agreeable to tx session. She ambulated throughout session with HHA, requiring VCs not to reach outside BOS to stabilize self on furniiture. She gathered clothing items from drawers in prep for shower, requiring VCs for scanning to locate items R of midline.  She bathed standing in shower with guarding assist, able to attend to all areas of body. She dressed seated on  toilet, attempted to stand to thread pants, however, R LOB requiring mod A and use of wall on her R to prevent fall. She returned to standard chair and desired to eat late lunch tray that arrived. She was able to locate all items on lunch tray and used R UE to self feed mod I. Pt left seated in chair to finish lunch, niece present, and RN made aware of pt's position. Education provided throughout session regarding role of OT, POC, decreasing caregiver burden, stroke recovery, and d/c planning.   OT Evaluation Precautions/Restrictions  Precautions Precautions: Fall Precaution Comments: right inattention, right hemianopsia Restrictions Weight Bearing Restrictions: No General Chart Reviewed: Yes Pain   No/ denies pain Home Living/Prior Functioning Home Living Living Arrangements: Alone Available Help at Discharge: Family, Available 24 hours/day Type of Home: House Home Access: Stairs to enter [employer] of Steps: 4 Entrance Stairs-Rails: Left Home Layout: Two level, Laundry or work area in basement, Able to live on main level with bedroom/bathroom Bathroom Shower/Tub: Multimedia programmer: Programmer, systems: Yes  Lives With: Alone IADL History Homemaking Responsibilities: Yes Current License: Yes Mode of Transportation: Musician Occupation: Retired Leisure and Hobbies: Visual merchandiser, dogs Prior Function Level of Independence: Independent with basic ADLs, Independent with homemaking with ambulation, Independent with transfers, Independent with gait  Able to Take Stairs?: Yes Driving: Yes Vocation: Retired Leisure: Hobbies-yes (Comment) Comments: senior games. table tennis pickle ball Vision Baseline Vision/History: Wears glasses Wears Glasses: Reading only Patient Visual Report: Blurring of vision;Peripheral vision impairment Vision Assessment?: Yes Ocular Range of Motion: Within Functional Limits Perception  Perception:  Impaired Inattention/Neglect: Does not attend to right visual field;Does not attend to right side of body Praxis Praxis: Impaired Praxis Impairment Details: Motor planning Cognition Overall Cognitive Status: Impaired/Different from baseline Arousal/Alertness: Awake/alert Orientation Level: Person;Place;Situation Person: Oriented Place: Oriented Situation: Oriented Year: 2019 Month: January Day of Week: Incorrect Memory: Impaired Memory Impairment: Retrieval deficit;Decreased short term memory;Decreased recall of new information Decreased Short Term Memory: Verbal basic;Functional basic Immediate Memory Recall: Sock;Blue;Bed Memory Recall: Sock;Blue;Bed Memory Recall Sock: Without Cue Memory Recall Blue: With Cue Memory Recall Bed: Without Cue Attention: Selective Focused Attention: Appears intact Sustained Attention: Appears intact Selective Attention: Appears intact Awareness: Impaired Awareness Impairment: Emergent impairment;Anticipatory impairment Problem Solving: Impaired Problem Solving Impairment: Verbal complex;Functional complex Executive Function: Reasoning;Decision Making Reasoning: Impaired Reasoning Impairment: Verbal complex;Functional complex Decision Making: Impaired Decision Making Impairment: Verbal complex;Functional complex Behaviors: Impulsive Safety/Judgment: Impaired Comments: Decreaed awareness of deficits Sensation Sensation Light Touch: Impaired by gross assessment Hot/Cold: Impaired by gross  assessment(Pt reports decreased sensation in R vs L) Proprioception: Impaired by gross assessment(Ataxic) Coordination Gross Motor Movements are Fluid and Coordinated: No Fine Motor Movements are Fluid and Coordinated: No Coordination and Movement Description: Ataxia in the RUE and RLE Finger Nose Finger Test: Ataxic RUE Motor  Motor Motor: Hemiplegia;Ataxia Motor - Skilled Clinical Observations: mild R sided weakness. RUE and RLE ataxia   Trunk/Postural Assessment  Cervical Assessment Cervical Assessment: Within Functional Limits Thoracic Assessment Thoracic Assessment: Within Functional Limits Lumbar Assessment Lumbar Assessment: Within Functional Limits Postural Control Postural Control: Deficits on evaluation(R lean with dynamic tasks)  Balance Balance Balance Assessed: Yes Static Sitting Balance Static Sitting - Balance Support: Feet supported Static Sitting - Level of Assistance: 6: Modified independent (Device/Increase time) Dynamic Sitting Balance Dynamic Sitting - Balance Support: Feet supported;During functional activity Dynamic Sitting - Level of Assistance: 5: Stand by assistance Sitting balance - Comments: Sitting on toilet to dress Static Standing Balance Static Standing - Balance Support: During functional activity Static Standing - Level of Assistance: 5: Stand by assistance;4: Min assist Dynamic Standing Balance Dynamic Standing - Balance Support: During functional activity;Right upper extremity supported;Left upper extremity supported Dynamic Standing - Level of Assistance: 4: Min assist Dynamic Standing - Comments: Standing to complete LB bathing/dressing session Extremity/Trunk Assessment RUE Assessment RUE Assessment: Within Functional Limits LUE Assessment LUE Assessment: Within Functional Limits   See Function Navigator for Current Functional Status.   Refer to Care Plan for Long Term Goals  Recommendations for other services: Neuropsych and Therapeutic Recreation  Pet therapy, Kitchen group, Stress management and Outing/community reintegration   Discharge Criteria: Patient will be discharged from OT if patient refuses treatment 3 consecutive times without medical reason, if treatment goals not met, if there is a change in medical status, if patient makes no progress towards goals or if patient is discharged from hospital.  The above assessment, treatment plan, treatment alternatives  and goals were discussed and mutually agreed upon: by patient  Anita Mcadory L 06/21/2017, 3:03 PM

## 2017-06-21 NOTE — Progress Notes (Signed)
Patient information reviewed and entered into eRehab system by Remington Skalsky, RN, CRRN, PPS Coordinator.  Information including medical coding and functional independence measure will be reviewed and updated through discharge.     Per nursing patient was given "Data Collection Information Summary for Patients in Inpatient Rehabilitation Facilities with attached "Privacy Act Statement-Health Care Records" upon admission.  

## 2017-06-21 NOTE — Progress Notes (Signed)
Subjective/Complaints:   Objective: Vital Signs: Blood pressure 129/74, pulse 65, temperature 98 F (36.7 C), temperature source Oral, resp. rate 18, height 5' 3" (1.6 m), SpO2 96 %. No results found. Results for orders placed or performed during the hospital encounter of 06/20/17 (from the past 72 hour(s))  CBC     Status: None   Collection Time: 06/20/17  6:28 PM  Result Value Ref Range   WBC 5.1 4.0 - 10.5 K/uL   RBC 4.03 3.87 - 5.11 MIL/uL   Hemoglobin 12.7 12.0 - 15.0 g/dL   HCT 38.6 36.0 - 46.0 %   MCV 95.8 78.0 - 100.0 fL   MCH 31.5 26.0 - 34.0 pg   MCHC 32.9 30.0 - 36.0 g/dL   RDW 12.7 11.5 - 15.5 %   Platelets 158 150 - 400 K/uL  Creatinine, serum     Status: None   Collection Time: 06/20/17  6:28 PM  Result Value Ref Range   Creatinine, Ser 0.81 0.44 - 1.00 mg/dL   GFR calc non Af Amer >60 >60 mL/min   GFR calc Af Amer >60 >60 mL/min    Comment: (NOTE) The eGFR has been calculated using the CKD EPI equation. This calculation has not been validated in all clinical situations. eGFR's persistently <60 mL/min signify possible Chronic Kidney Disease.      HEENT: normal Cardio: RRR and no murmur Resp: CTA B/L and unlabored GI: BS positive and NT, ND Extremity:  No Edema Skin:   Intact Neuro: Alert/Oriented, Normal Sensory, Normal Motor, Abnormal FMC Ataxic/ dec FMC, Aphasic and Inattention Musc/Skel:  Other no pain with UE or LE ROM Gen NAD   Assessment/Plan: 1. Functional deficits secondary to Left P2 PCA infarct with aphasia ,apraxia, fine motor def which require 3+ hours per day of interdisciplinary therapy in a comprehensive inpatient rehab setting. Physiatrist is providing close team supervision and 24 hour management of active medical problems listed below. Physiatrist and rehab team continue to assess barriers to discharge/monitor patient progress toward functional and medical goals. FIM:       Function - Toileting Toileting steps completed by  patient: Adjust clothing prior to toileting, Performs perineal hygiene, Adjust clothing after toileting Toileting Assistive Devices: Grab bar or rail, Toilet aid Assist level: Supervision or verbal cues, Touching or steadying assistance (Pt.75%)  Function - Air cabin crew transfer assistive device: Grab bar Assist level to toilet: Touching or steadying assistance (Pt > 75%) Assist level from toilet: Touching or steadying assistance (Pt > 75%)        Function - Comprehension Comprehension: Visual Comprehension assist level: Follows basic conversation/direction with extra time/assistive device  Function - Expression Expression: Verbal Expression assist level: Expresses complex 90% of the time/cues < 10% of the time  Function - Social Interaction Social Interaction assist level: Interacts appropriately with others with medication or extra time (anti-anxiety, antidepressant).  Function - Problem Solving Problem solving assist level: Solves complex 90% of the time/cues < 10% of the time  Function - Memory Memory assist level: Recognizes or recalls 90% of the time/requires cueing < 10% of the time Patient normally able to recall (first 3 days only): Current season, Location of own room, Staff names and faces, That he or she is in a hospital  Medical Problem List and Plan: 1.Right side weaknesssecondary tolarge PCA infarction. Patient did receive TPA- EC ASA -CIR PT, OT ,SLP 2. DVT Prophylaxis/Anticoagulation: Subcutaneous Lovenox. Monitor for any bleeding episodes, PLT 158 monitor 3. Pain Management:Tylenol as needed  4. Mood:Provide emotional support 5. Neuropsych: This patientiscapable of making decisions on herown behalf. 6. Skin/Wound Care:Routine skin checks 7. Fluids/Electrolytes/Nutrition:Routine I&O's, creat nl 8.Hyperlipidemia. Lipitor    LOS (Days) 1 A FACE TO FACE EVALUATION WAS PERFORMED  Charlett Blake 06/21/2017, 8:31 AM

## 2017-06-22 ENCOUNTER — Inpatient Hospital Stay (HOSPITAL_COMMUNITY): Payer: Medicare Other | Admitting: Speech Pathology

## 2017-06-22 ENCOUNTER — Inpatient Hospital Stay (HOSPITAL_COMMUNITY): Payer: Medicare Other | Admitting: Physical Therapy

## 2017-06-22 ENCOUNTER — Inpatient Hospital Stay (HOSPITAL_COMMUNITY): Payer: Medicare Other | Admitting: Occupational Therapy

## 2017-06-22 NOTE — Progress Notes (Signed)
Inpatient Rehabilitation Center Individual Statement of Services  Patient Name:  [name]  Date:  06/22/2017  Welcome to the Inpatient Rehabilitation Center.  Our goal is to provide you with an individualized program based on your diagnosis and situation, designed to meet your specific needs.  With this comprehensive rehabilitation program, you will be expected to participate in at least 3 hours of rehabilitation therapies Monday-Friday, with modified therapy programming on the weekends.  Your rehabilitation program will include the following services:  Physical Therapy (PT), Occupational Therapy (OT), Speech Therapy (ST), 24 hour per day rehabilitation nursing, Case Management (Social Worker), Rehabilitation Medicine, Nutrition Services and Pharmacy Services  Weekly team conferences will be held on Wednesdays to discuss your progress.  Your Social Worker will talk with you frequently to get your input and to update you on team discussions.  Team conferences with you and your family in attendance may also be held.  Expected length of stay:  About a week  Overall anticipated outcome:  supervision  Depending on your progress and recovery, your program may change. Your Social Worker will coordinate services and will keep you informed of any changes. Your Social Worker's name and contact numbers are listed  below.  The following services may also be recommended but are not provided by the Inpatient Rehabilitation Center:   Driving Evaluations  Home Health Rehabiltiation Services  Outpatient Rehabilitation Services   Arrangements will be made to provide these services after discharge if needed.  Arrangements include referral to agencies that provide these services.  Your insurance has been verified to be:  Medicare and a supplement (you plan for your family bring in the card and Boneta LucksJenny will make a copy and have it entered into your chart.) Your primary doctor is:  You do not have one  currently.  Please allow your family or Boneta LucksJenny to get you connected to a doctor.)  Pertinent information will be shared with your doctor and your insurance company.  Social Worker:  Staci AcostaJenny Janayla Marik, LCSW  [phone] or ([phone]) [phone]  Information discussed with and copy given to patient by: Elvera LennoxPrevatt, Edwin Baines Capps, 06/22/2017, 3:23 PM

## 2017-06-22 NOTE — IPOC Note (Signed)
Overall Plan of Care [facility](IPOC) Patient Details Name: Angela SheererReva R Geng MRN: [id number] DOB: 06/30/1935  Admitting Diagnosis: <principal problem not specified>  Hospital Problems: Active Problems:   Cerebrovascular accident (CVA) due to occlusion of left posterior communicating artery (HCC)     Functional Problem List: Nursing Endurance, Medication Management, Motor, Perception, Safety, Sensory, Skin Integrity  PT Balance, Behavior, Endurance, Motor, Nutrition, Perception, Safety, Sensory, Skin Integrity  OT Balance, Vision, Cognition, Perception, Safety, Endurance, Sensory  SLP Cognition  TR         Basic ADL's: OT Eating, Grooming, Bathing, Dressing, Toileting     Advanced  ADL's: OT Simple Meal Preparation, Light Housekeeping, Laundry     Transfers: PT Bed Mobility, Bed to Chair, Set designerCar, State Street CorporationFurniture, Civil Service fast [place]erl[place]  OT Toilet, Research scientist (life sciences)Tub/Shower     Locomotion: PT Ambulation, Stairs, Psychologist, prison and probation servicesWheelchair Mobility     Additional Impairments: OT    SLP Social Cognition   Problem Solving, Memory, Attention, Awareness  TR      Anticipated Outcomes Item Anticipated Outcome  Self Feeding Mod I  Swallowing      Basic self-care  Mod I  Toileting  Mod I   Bathroom Transfers Mod I  Bowel/Bladder  mod I   Transfers  Mod I with LRAD   Locomotion  Mod I with LRAD at househol distances.   Communication     Cognition  Supervision  Pain  less than 2   Safety/Judgment  mod I    Therapy Plan: PT Intensity: Minimum of 1-2 x/day ,45 to 90 minutes PT Frequency: 5 out of 7 days PT Duration Estimated Length of Stay: 8-10 days  OT Intensity: Minimum of 1-2 x/day, 45 to 90 minutes OT Frequency: 5 out of 7 days OT Duration/Estimated Length of Stay: 7 days SLP Intensity: Minumum of 1-2 x/day, 30 to 90 minutes SLP Frequency: 3 to 5 out of 7 days SLP Duration/Estimated Length of Stay: 7 days    Team Interventions: Nursing Interventions Patient/Family Education, Disease Management/Prevention, Medication  Management, Skin Care/Wound Management  PT interventions Ambulation/gait training, Cognitive remediation/compensation, DME/adaptive equipment instruction, Community reintegration, Warden/rangerBalance/vestibular training, Functional electrical stimulation, Disease management/prevention, Discharge planning, Functional mobility training, Neuromuscular re-education, Pain management, Patient/family education, Psychosocial support, Skin care/wound management, Splinting/orthotics, Stair training, Therapeutic Exercise, Therapeutic Activities, UE/LE Strength taining/ROM, UE/LE Coordination activities, Wheelchair propulsion/positioning, Visual/perceptual remediation/compensation  OT Interventions Warden/rangerBalance/vestibular training, Community reintegration, Disease mangement/prevention, Neuromuscular re-education, Patient/family education, Self Care/advanced ADL retraining, Splinting/orthotics, Therapeutic Exercise, UE/LE Coordination activities, Visual/perceptual remediation/compensation, UE/LE Strength taining/ROM, Therapeutic Activities, Psychosocial support, Functional mobility training, DME/adaptive equipment instruction, Discharge planning, Cognitive remediation/compensation  SLP Interventions Cognitive remediation/compensation, Patient/family education, Medication managment, Therapeutic Activities, Functional tasks  TR Interventions    SW/CM Interventions Discharge Planning, Psychosocial Support, Patient/Family Education   Barriers to Discharge MD  Medical stability  Nursing      PT Decreased caregiver support, Home environment access/layout    OT      SLP      SW       Team Discharge Planning: Destination: PT-Home ,OT- Home , SLP-Home Projected Follow-up: PT-Outpatient PT, Home health PT, OT-  Home health OT, Outpatient OT, SLP-Outpatient SLP, 24 hour supervision/assistance Projected Equipment Needs: PT-To be determined, Cane, OT- To be determined, SLP-None recommended by SLP Equipment Details: PT- , OT-   Patient/family involved in discharge planning: PT- Patient,  OT-Patient, SLP-Patient, Family member/caregiver  MD ELOS: 7-10d Medical Rehab Prognosis:  Good Assessment:  82 y.o.right handed femalewith unremarkable past medical history on no prescription medications. Per chart review  patient lives alone independent prior to admission. Two-level home with bedroom and bathroom on first floor.Family live in the area questioned assistance on discharge.[facility]ud[facility] onset of dizziness with right-sided weakness, slurred speech,blurred visionand fall. Cranial CT scan negative. Patient did receive TPA. CT cerebral perfusion scan as well as CT angiogram head and neck showed left P2 segment occlusion. 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 Echocardiogram with ejection fraction of 70% no wall motion abnormalities.Follow-up CT of the head 06/18/2017 showed some increased edema and mass effect associated with left PCA infarction and continue to monitor.Neurology follow-upand currently maintained on aspirin for CVA prophylaxis. Subcutaneous Lovenox for DVT prophylaxis.TEEshowed ejection fraction 65%. Negative bubble study. No thrombusnoted.Maintained on a regular diet.Physical and occupational therapy evaluation completed 06/16/2017 with recommendations of physical medicine rehabilitation consult   Now requiring 24/7 Rehab RN,MD, as well as CIR level PT, OT and SLP.  Treatment team will focus on ADLs and mobility with goals set at Mod i   See Team Conference Notes for weekly updates to the plan of care

## 2017-06-22 NOTE — Progress Notes (Signed)
Physical Therapy Session Note  Patient Details  Name: [name] MRN: [id number] Date of Birth: 11/22/1935  Today's Date: 06/22/2017 PT Individual Time: 1510-1605 PT Individual Time Calculation (min): 55 min   Short Term Goals: Week 1:  PT Short Term Goal 1 (Week 1): STG=LTG due to ELOS   Skilled Therapeutic Interventions/Progress Updates:   Pt received sitting in WC and agreeable to PT. Gait training with supervision assist 2x 200ft + 400ft x 2; min cues for attention to obstacles on the  And direction through hospital. PT instructed pt in completing peg board puzzle. Low level difficulty progressing to moderate difficulty. Moderate cues for improved visual scanning to the R as well as cues to develop strategies to compensate for visual deficits. Progressing from level floor and standing on airex. Supervision assist to maintain balance with min verbal instruction from PT for hip strategy to prevent LOB. WC mobility x 150ft with supervision assist with intermittent verbal instruction for improved use of RUE to maintain straight path. Patient returned to room and left sitting in WC with call bell in reach and all needs met.          Therapy Documentation Precautions:  Precautions Precautions: Fall Precaution Comments: right inattention, right hemianopsia Restrictions Weight Bearing Restrictions: No Pain : 0/10   See Function Navigator for Current Functional Status.   Therapy/Group: Individual Therapy  Austin E Tucker 06/22/2017, 5:19 PM  

## 2017-06-22 NOTE — Progress Notes (Signed)
Speech Language Pathology Daily Session Note  Patient Details  Name: [name] MRN: [id number] Date of Birth: 02/21/1936  Today's Date: 06/22/2017 SLP Individual Time: 1030-1045 SLP Individual Time Calculation (min): 15 min   Group Session: 1045-1130 (45 minutes)  Short Term Goals: Week 1: SLP Short Term Goal 1 (Week 1): Pt will utilize external memory aids to recall semi-complex daily information with supervision cues.  SLP Short Term Goal 2 (Week 1): Pt will locate items to right of midline with supervision cues.  SLP Short Term Goal 3 (Week 1): Pt will demonstrate anticipatory awareness by listing 3 activities that are safe to participate in at home with supervision cues.  SLP Short Term Goal 4 (Week 1): Pt will complete semi-complex medication management tasks with supervision cues.  SLP Short Term Goal 5 (Week 1): Pt will complete semi-complex money management tasks with supervision cues.   Skilled Therapeutic Interventions:  Individual session focused on cognition goals. SLP facilitated session by providing notebook to be used as memory book. Pt able to recall write all orientation information as well as previous session with OT.  Pt able to list 3 areas of deficits that she is targeting in therapies. Pt required Max A for spelling words such as "hospital, pudding" however pt commented that she was an aweful speller. Notebook left with pt in room to reveiw with visitor.  At times, pt presents with what appear to be word finding difficulties or inability to comprehend spoken language. For example, pt until to list sports that she avidly plays. She was not able to verbally describe how to play sport that she plays almost daily. Pt with nonfluent disruptions and loss of thought. Pt aware of inability, and stated "I can't remember, I lost my thought"  Appeared to be related to retrieval of information not expressive aphasia (as pt didn't retain thought she was attempting to express). On  another occasion pt was asked "do you like to cook or bake?" Pt replied, "I don't know what you are saying" and appeared unable to comprehend the words spoken. However, during general conversation with another pt and SLP she was able to ask and answer quetions in fluent sentences that were appropriate to topic. Suspect that pt's cognitive deficits (specifically related to memory) interfere with expressive and receptive abilities.     Function:    Cognition Comprehension Comprehension assist level: Understands basic 90% of the time/cues < 10% of the time;Understands basic 75 - 89% of the time/ requires cueing 10 - 24% of the time  Expression   Expression assist level: Expresses basic needs/ideas: With extra time/assistive device;Expresses basic 90% of the time/requires cueing < 10% of the time.  Social Interaction Social Interaction assist level: Interacts appropriately with others with medication or extra time (anti-anxiety, antidepressant).  Problem Solving Problem solving assist level: Solves basic 50 - 74% of the time/requires cueing 25 - 49% of the time;Solves basic 75 - 89% of the time/requires cueing 10 - 24% of the time  Memory Memory assist level: Recognizes or recalls 50 - 74% of the time/requires cueing 25 - 49% of the time;Recognizes or recalls 25 - 49% of the time/requires cueing 50 - 75% of the time    Pain    Therapy/Group: Individual Therapy and Group Therapy  Jensyn Cambria 06/22/2017, 12:57 PM

## 2017-06-22 NOTE — Progress Notes (Signed)
Social Work Assessment and Plan  Patient Details  Name: [name] MRN: [id number] Date of Birth: [birth year]  Today's Date: 06/22/2017  Problem List:  Patient Active Problem List   Diagnosis Date Noted  . Cerebrovascular accident (CVA) due to occlusion of left posterior communicating artery ([place]) 06/20/2017  . Stroke (cerebrum) ([place]) 06/15/2017   Past Medical History:  Past Medical History:  Diagnosis Date  . Stroke [facility])    Past Surgical History:  Past Surgical History:  Procedure Laterality Date  . LOOP RECORDER INSERTION N/A 06/20/2017   Procedure: LOOP RECORDER INSERTION;  Surgeon: Thompson Grayer, MD;  Location: [place] CV LAB;  Service: Cardiovascular;  Laterality: N/A;  . TEE WITHOUT CARDIOVERSION N/A 06/19/2017   Procedure: TRANSESOPHAGEAL ECHOCARDIOGRAM (TEE) WITH LOOP;  Surgeon: Jerline Pain, MD;  Location: [place] ENDOSCOPY;  Service: Cardiovascular;  Laterality: N/A;   Social History:  reports that  has never smoked. she has never used smokeless tobacco. She reports that she does not drink alcohol or use drugs.  Family / Support Systems Marital Status: Widow/Widower How Long?: 20+ years Patient Roles: Parent, Caregiver, Other (Comment)(aunt; grandmother; sister; friend) Children: Kelli Churn - dtr - [phone] Other Supports: Myrtha Mantis - sister - [phone];  Rodrigo Ran - niece - [phone] Anticipated Caregiver: family plans to work out someone to be with pt 24/7 between family and possibly paid caregivers Ability/Limitations of Caregiver: as above Caregiver Availability: 24/7 Family Dynamics: Pt has a large, supportive family.  They will work to have someone with pt 24/7 vs. hired caregivers vs. pt going to [place] to be with dtr, Olivia Mackie.  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   Abuse/Neglect Abuse/Neglect Assessment Can Be Completed: Yes Physical Abuse: Denies Verbal Abuse: Denies Sexual Abuse: Denies Exploitation of patient/patient's resources: Denies Self-Neglect: Denies  Emotional Status Pt's affect, behavior and adjustment status: Pt is clearly frustrated with her condition.  She does not go to the doctor and enjoys being active and her independence, so sitting in her hospital room is causing her to be a little antsy.  She is pleasant and hard working. Recent Psychosocial Issues: none, besides this hospital admission Psychiatric History: none reported Substance Abuse History: none reported  Patient / Family Perceptions, Expectations & Goals Pt/Family understanding of illness & functional limitations: Pt/family has a good understanding of pt's condition and what happened, but pt has a hard time accepting suggestions and recommendations which involve her needing medical f/u and 24/7 supervision. Premorbid pt/family roles/activities: Pt played pickle ball 4-5x/week and was very independent and active. Anticipated changes in roles/activities/participation: Pt wants to resume her activities as soon as she is able. Pt/family expectations/goals: Pt wants to get home and to regain her independence.  Community Resources Express Scripts: None Premorbid Home Care/DME Agencies: None Transportation available at discharge: family Resource referrals recommended: Neuropsychology, Support group (specify)  Discharge Planning Living Arrangements: Alone Support Systems: Children, Other relatives, Friends/neighbors Type of Residence: Private residence Insurance Resources: [employer], Multimedia programmer (specify)(AARP secondary insurance) Museum/gallery curator Resources: Radio broadcast assistant Screen Referred: No Money Management: Patient Does the patient  have any problems obtaining your medications?: No Home Management: Pt was doing all of this, but family can assist while pt is revcovering. Patient/Family Preliminary Plans: Pt's family is working with  pt to try to figure out how to provide 24/7 supervision to pt.  Pt's home with family/caregivers vs. to pt's dtr's home in [place]. Social Work Anticipated Follow Up Needs: HH/OP, Support Group(Stroke Support Group) Expected length of stay: about a week  Clinical Impression CSW met with pt and her niece, Jeannetta Nap, and later talked with pt's dtr, Olivia Mackie, via telephone to introduce self and role of CSW, as well as to complete assessment.  Pt was talkative with CSW, but is clearly frustrated with her current condition post stroke.  CSW talked to her about the team's recommendation for 24/7 supervision and that she should have a PCP and that [facility] would help her get established with PCP, if desired.  Family is trying to arrange 24/7 supervision at pt's home vs. Pt going to dtr's home for a bit.  CSW will asked neuropsychologist to see pt, as she is used to being independent and is strong willed.  Pt is doing well on CIR, but is glad to be going home 06-28-17.  CSW will continue to follow pt and assist pt/family with d/c planning as needed.  Tylan Kinn, Silvestre Mesi 06/22/2017, 11:46 PM

## 2017-06-22 NOTE — Progress Notes (Signed)
Social Work Patient ID: Montez Morita, female   DOB: 1936-04-20, 82 y.o.   MRN: [id number]   CSW met with pt and her niece, Jeannetta Nap, and then later talked with her dtr, Tammy, via telephone to update them on team conference discussion.  They already knew of pt's d/c date of 06-28-17, but CSW talked about need for 24/7 supervision.  Pt was not happy to hear this and does not want someone around all the time.  CSW tried to explain the importance and PT has also discussed this.  Family seems to be on board with this need.  Explained that insurance does not pay for this type of care.  Pt has a large, supportive family and they are trying to decide where it will be best for pt to d/c to.  CSW received pt's secondary insurance information and will make sure that [facility] registration receives this information.  CSW remains available to assist as needed.

## 2017-06-22 NOTE — Patient Care Conference (Signed)
Inpatient RehabilitationTeam Conference and Plan of Care Update Date: 06/21/2017   Time: 10:55 AM    Patient Name: [name]      Medical Record Number: [id number]  Date of Birth: 01/03/1936 Sex: Female         Room/Bed: 4W19C/4W19C-01 Payor Info: Payor: MEDICARE / Plan: MEDICARE PART A AND B / Product Type: *No Product type* /    Admitting Diagnosis: CVA  Admit Date/Time:  06/20/2017  3:06 PM Admission Comments: No comment available   Primary Diagnosis:  <principal problem not specified> Principal Problem: <principal problem not specified>  Patient Active Problem List   Diagnosis Date Noted  . Cerebrovascular accident (CVA) due to occlusion of left posterior communicating artery ([facility]) 06/20/2017  . Stroke (cerebrum) ([facility]) 06/15/2017    Expected Discharge Date: Expected Discharge Date: 06/28/17  Team Members Present: Physician leading conference: Dr. Claudette LawsAndrew Kirsteins Social Worker Present: Staci AcostaJenny Earline Stiner, LCSW Nurse Present: Chana Bodeeborah Sharp, RN PT Present: Grier RocherAustin Tucker, PT;[name], PTA OT Present: Johnsie CancelAmy Lewis, OT SLP Present: Reuel DerbyHappi Overton, SLP PPS Coordinator present : Tora DuckMarie Noel, RN, CRRN     Current Status/Progress Goal Weekly Team Focus  Medical   BP controlled, R field cut/neglect , fall risk  reduce fall risk increase attn to R   initiate therapy    Bowel/Bladder   Continent of Bowel and Bladder LBM 06/18/16 ,   Remain Continent of bowel/bladder while on Rehab  Assess QS and prn, document and provide Laxative if no results with 3 days obtain prn laxative orders    Swallow/Nutrition/ Hydration             ADL's   Supervision-min A overall  Supervision IADLs, mod I basic ADLs. Recommending 24 hr supervisionat d/c  R side attention, cognitive remediation, safety awarenss, ADL/IADL retraining   Mobility   Min assist with transfers and gait, min-supervision assist bed mobility. R visual field deficits and mild receptive/expressive aphasia limit pt's safety awareness   Ambulatory at supervisio level without AD. mod I bed mobility and transfers  Safety awareness, balance, transfers, improved independence with gait.    Communication             Safety/Cognition/ Behavioral Observations  Max A to Mod A recall, Max A anticipatory awareness  Supervision  utilization of memory book, anticipatory awareness   Pain   Denies pain  < 2,   Assess QS and PRN   Skin   s/p Loop recorder 06/20/2017 left chest wall, Ecchymotic areas to bilateral arms/shoulders and Left knee  Maintain skin integrity  Assess QS and prn , document all changes and notify MD/PA if warranted    Rehab Goals Patient on target to meet rehab goals: Yes Rehab Goals Revised: none *See Care Plan and progress notes for long and short-term goals.     Barriers to Discharge  Current Status/Progress Possible Resolutions Date Resolved   Physician    Medical stability     progressing well  Short LOS      Nursing                  PT  Decreased caregiver support;Home environment access/layout                 OT                  SLP                SW  Discharge Planning/Teaching Needs:  Pt to d/c home with family to provide 24/7 supervision.  D/C destination to be determined by pt and her family.  Family education to be offered to caregivers prior to d/c.   Team Discussion:  Pt with L PCA with aphasia and some balance and fine motor skills.  Doing well medically and was active PTA and plays pickle ball.  Pt has loop recorder and needs to have a bowel movement, but she has been refusing medications.  PT has set supervision level goals and is currently min A with right inattention.  Pt told team she has a younger sister who will move in with her temporarily.    Revisions to Treatment Plan:  none    Continued Need for Acute Rehabilitation Level of Care: The patient requires daily medical management by a physician with specialized training in physical medicine and rehabilitation for  the following conditions: Daily direction of a multidisciplinary physical rehabilitation program to ensure safe treatment while eliciting the highest outcome that is of practical value to the patient.: Yes Daily medical management of patient stability for increased activity during participation in an intensive rehabilitation regime.: Yes Daily analysis of laboratory values and/or radiology reports with any subsequent need for medication adjustment of medical intervention for : Neurological problems  Anasha Perfecto, Vista Deck 06/23/2017, 11:53 PM

## 2017-06-22 NOTE — Progress Notes (Signed)
Occupational Therapy Session Note  Patient Details  Name: Angela SheererReva R Alridge MRN: [id number] Date of Birth: 03/10/1936  Today's Date: 06/22/2017 OT Individual Time: [id number] OT Individual Time Calculation (min): 75 min    Short Term Goals: Week 1:  OT Short Term Goal 1 (Week 1): STG=LTG due to LOS  Skilled Therapeutic Interventions/Progress Updates:    Pt seen for OT session focusing on R attention and ADL/IADL goals. Pt in recliner upon arrival, trying to navigate around room. Pt able to be redirected to task. She declined bathing/dressing this session. Completed grooming tasks standing at sink, requiring VCs to locate items on R though demonstrated good carryover of compensatory head turning techniques once aware of items on R side. She ambulated throughout unit with supervision and assist required for path finding and recalling verbal directions. LOB episodes during head turning tasks requiring min A and use of wall rail to correct. Completed Dyn[employer] activity in standing, initially requiring 3x longer to locate targets R of midline, however, on 3rd trial only averaged one second slower reaction time R vs. L.  In ADL apartment, completed simple meal prep task. Pt fearful of attempting to cook at stove top level, therefore instead made pudding. Requiring max cuing to locate and recall location of items needed for cooking task. She also demonstrated moderately severe word finding abilities when attempting to describe her daily routine and some difficulty understanding/processing verbal directions/ object identification from therapist.   Pt returned to her room at end of session, left seated in w/c with all needs in reach, reviewed need for use of call bell and assist for all mobility.   Therapy Documentation Precautions:  Precautions Precautions: Fall Precaution Comments: right inattention, right hemianopsia Restrictions Weight Bearing Restrictions: No Pain:   No/ denies pain  See Function  Navigator for Current Functional Status.   Therapy/Group: Individual Therapy  Nikyah Lackman L 06/22/2017, 7:02 AM

## 2017-06-22 NOTE — Progress Notes (Signed)
Subjective/Complaints: No issues overnite, pt denies pain, notes some numbness in RUE ROS:  Neg CP,SOB, N/V/D  Objective: Vital Signs: Blood pressure (!) 141/74, pulse 61, temperature 97.8 F (36.6 C), temperature source Oral, resp. rate 18, height 5' 3" (1.6 m), SpO2 95 %. No results found. Results for orders placed or performed during the hospital encounter of 06/20/17 (from the past 72 hour(s))  CBC     Status: None   Collection Time: 06/20/17  6:28 PM  Result Value Ref Range   WBC 5.1 4.0 - 10.5 K/uL   RBC 4.03 3.87 - 5.11 MIL/uL   Hemoglobin 12.7 12.0 - 15.0 g/dL   HCT 38.6 36.0 - 46.0 %   MCV 95.8 78.0 - 100.0 fL   MCH 31.5 26.0 - 34.0 pg   MCHC 32.9 30.0 - 36.0 g/dL   RDW 12.7 11.5 - 15.5 %   Platelets 158 150 - 400 K/uL  Creatinine, serum     Status: None   Collection Time: 06/20/17  6:28 PM  Result Value Ref Range   Creatinine, Ser 0.81 0.44 - 1.00 mg/dL   GFR calc non Af Amer >60 >60 mL/min   GFR calc Af Amer >60 >60 mL/min    Comment: (NOTE) The eGFR has been calculated using the CKD EPI equation. This calculation has not been validated in all clinical situations. eGFR's persistently <60 mL/min signify possible Chronic Kidney Disease.   CBC WITH DIFFERENTIAL     Status: None   Collection Time: 06/21/17  8:50 AM  Result Value Ref Range   WBC 5.3 4.0 - 10.5 K/uL   RBC 4.06 3.87 - 5.11 MIL/uL   Hemoglobin 12.7 12.0 - 15.0 g/dL   HCT 38.7 36.0 - 46.0 %   MCV 95.3 78.0 - 100.0 fL   MCH 31.3 26.0 - 34.0 pg   MCHC 32.8 30.0 - 36.0 g/dL   RDW 12.5 11.5 - 15.5 %   Platelets 179 150 - 400 K/uL   Neutrophils Relative % 66 %   Neutro Abs 3.5 1.7 - 7.7 K/uL   Lymphocytes Relative 23 %   Lymphs Abs 1.2 0.7 - 4.0 K/uL   Monocytes Relative 7 %   Monocytes Absolute 0.4 0.1 - 1.0 K/uL   Eosinophils Relative 4 %   Eosinophils Absolute 0.2 0.0 - 0.7 K/uL   Basophils Relative 0 %   Basophils Absolute 0.0 0.0 - 0.1 K/uL  Comprehensive metabolic panel     Status:  Abnormal   Collection Time: 06/21/17  8:50 AM  Result Value Ref Range   Sodium 139 135 - 145 mmol/L   Potassium 3.6 3.5 - 5.1 mmol/L   Chloride 105 101 - 111 mmol/L   CO2 25 22 - 32 mmol/L   Glucose, Bld 120 (H) 65 - 99 mg/dL   BUN 19 6 - 20 mg/dL   Creatinine, Ser 0.82 0.44 - 1.00 mg/dL   Calcium 9.3 8.9 - 10.3 mg/dL   Total Protein 6.2 (L) 6.5 - 8.1 g/dL   Albumin 3.6 3.5 - 5.0 g/dL   AST 26 15 - 41 U/L   ALT 21 14 - 54 U/L   Alkaline Phosphatase 68 38 - 126 U/L   Total Bilirubin 1.0 0.3 - 1.2 mg/dL   GFR calc non Af Amer >60 >60 mL/min   GFR calc Af Amer >60 >60 mL/min    Comment: (NOTE) The eGFR has been calculated using the CKD EPI equation. This calculation has not been validated in all  clinical situations. eGFR's persistently <60 mL/min signify possible Chronic Kidney Disease.    Anion gap 9 5 - 15     HEENT: normal Cardio: RRR and no murmur Resp: CTA B/L and unlabored GI: BS positive and NT, ND Extremity:  No Edema Skin:   Intact Neuro: Alert/Oriented, Normal Sensory in UE and LE, Normal Motor, Abnormal FMC Ataxic/ dec FMC, Aphasic and Inattention Musc/Skel:  Other no pain with UE or LE ROM Gen NAD   Assessment/Plan: 1. Functional deficits secondary to Left P2 PCA infarct with aphasia ,apraxia, fine motor def which require 3+ hours per day of interdisciplinary therapy in a comprehensive inpatient rehab setting. Physiatrist is providing close team supervision and 24 hour management of active medical problems listed below. Physiatrist and rehab team continue to assess barriers to discharge/monitor patient progress toward functional and medical goals. FIM: Function - Bathing Position: Shower Body parts bathed by patient: Right arm, Left upper leg, Left arm, Right lower leg, Chest, Abdomen, Left lower leg, Back, Front perineal area, Buttocks, Right upper leg Assist Level: Touching or steadying assistance(Pt > 75%)  Function- Upper Body Dressing/Undressing What is  the patient wearing?: Pull over shirt/dress, Bra Bra - Perfomed by patient: Thread/unthread right bra strap, Thread/unthread left bra strap, Hook/unhook bra (pull down sports bra) Pull over shirt/dress - Perfomed by patient: Thread/unthread right sleeve, Thread/unthread left sleeve, Put head through opening, Pull shirt over trunk Assist Level: Supervision or verbal cues Function - Lower Body Dressing/Undressing What is the patient wearing?: Underwear, Pants, Ted Hose, Non-skid slipper socks Position: Other (comment)(sitting on toilet) Underwear - Performed by patient: Thread/unthread right underwear leg, Thread/unthread left underwear leg, Pull underwear up/down Pants- Performed by patient: Thread/unthread right pants leg, Pull pants up/down, Thread/unthread left pants leg Non-skid slipper socks- Performed by patient: Don/doff right sock, Don/doff left sock TED Hose - Performed by helper: Don/doff right TED hose, Don/doff left TED hose Assist for footwear: Supervision/touching assist Assist for lower body dressing: Touching or steadying assistance (Pt > 75%)  Function - Toileting Toileting steps completed by patient: Adjust clothing prior to toileting, Performs perineal hygiene, Adjust clothing after toileting Toileting Assistive Devices: Grab bar or rail Assist level: Touching or steadying assistance (Pt.75%)  Function - Air cabin crew transfer assistive device: Grab bar Assist level to toilet: Touching or steadying assistance (Pt > 75%) Assist level from toilet: Touching or steadying assistance (Pt > 75%)  Function - Chair/bed transfer Chair/bed transfer method: Stand pivot Chair/bed transfer assist level: Touching or steadying assistance (Pt > 75%)  Function - Locomotion: Wheelchair Will patient use wheelchair at discharge?: Yes Assist Level: Supervision or verbal cues Assist Level: Supervision or verbal cues Assist Level: Supervision or verbal cues Function - Locomotion:  Ambulation Assistive device: No device Max distance: 273f Assist level: Touching or steadying assistance (Pt > 75%) Assist level: Touching or steadying assistance (Pt > 75%) Assist level: Touching or steadying assistance (Pt > 75%) Assist level: Touching or steadying assistance (Pt > 75%) Assist level: Touching or steadying assistance (Pt > 75%)  Function - Comprehension Comprehension: Auditory Comprehension assist level: Follows basic conversation/direction with extra time/assistive device, Follows basic conversation/direction with no assist  Function - Expression Expression: Verbal Expression assist level: Expresses basic needs/ideas: With no assist, Expresses basic needs/ideas: With extra time/assistive device  Function - Social Interaction Social Interaction assist level: Interacts appropriately with others with medication or extra time (anti-anxiety, antidepressant).  Function - Problem Solving Problem solving assist level: Solves basic 90% of the time/requires  cueing < 10% of the time, Solves basic 75 - 89% of the time/requires cueing 10 - 24% of the time  Function - Memory Memory assist level: Recognizes or recalls 50 - 74% of the time/requires cueing 25 - 49% of the time Patient normally able to recall (first 3 days only): Current season, Location of own room, Staff names and faces, That he or she is in a hospital  Medical Problem List and Plan: 1.Right side weaknesssecondary tolarge PCA infarction. Patient did receive TPA- EC ASA -CIR PT, OT ,SLP 2. DVT Prophylaxis/Anticoagulation: Subcutaneous Lovenox. Monitor for any bleeding episodes, PLT 158 monitor 3. Pain Management:Tylenol as needed 4. Mood:Provide emotional support 5. Neuropsych: This patientiscapable of making decisions on herown behalf. 6. Skin/Wound Care:Routine skin checks 7. Fluids/Electrolytes/Nutrition:Routine I&O's, creat nl 8.Hyperlipidemia. Lipitor    LOS (Days) 2 A FACE TO  FACE EVALUATION WAS PERFORMED  Charlett Blake 06/22/2017, 8:04 AM

## 2017-06-23 ENCOUNTER — Inpatient Hospital Stay (HOSPITAL_COMMUNITY): Payer: Medicare Other | Admitting: Speech Pathology

## 2017-06-23 ENCOUNTER — Inpatient Hospital Stay (HOSPITAL_COMMUNITY): Payer: Medicare Other | Admitting: Occupational Therapy

## 2017-06-23 ENCOUNTER — Inpatient Hospital Stay (HOSPITAL_COMMUNITY): Payer: Medicare Other | Admitting: Physical Therapy

## 2017-06-23 NOTE — Progress Notes (Signed)
Speech Language Pathology Daily Session Note  Patient Details  Name: [name] MRN: [id number] Date of Birth: 03/01/1936  Today's Date: 06/23/2017 SLP Individual Time: 1345-1445 SLP Individual Time Calculation (min): 60 min  Short Term Goals: Week 1: SLP Short Term Goal 1 (Week 1): Pt will utilize external memory aids to recall semi-complex daily information with supervision cues.  SLP Short Term Goal 2 (Week 1): Pt will locate items to right of midline with supervision cues.  SLP Short Term Goal 3 (Week 1): Pt will demonstrate anticipatory awareness by listing 3 activities that are safe to participate in at home with supervision cues.  SLP Short Term Goal 4 (Week 1): Pt will complete semi-complex medication management tasks with supervision cues.  SLP Short Term Goal 5 (Week 1): Pt will complete semi-complex money management tasks with supervision cues.   Skilled Therapeutic Interventions: Skilled treatment session focused on cognition goals. SLP facilitated session by providing Min A cues to utilize memory book to recall/problem solve orientation information. Pt able to refer to previous page for orientation information once Min A cues provided. Pt further completed basic to semi-complex medication management task with supervision cues. Pt with increased expressive abilities over previous sessions but conitnues to have moments that she doesn't appear to comprehend what is said. She then turns head as if to hear better. Will continue to assess for possible language deficits. Pt left upright in wheelchair with all needs within reach. Continue per current plan of care.      Function:   Cognition Comprehension Comprehension assist level: Follows basic conversation/direction with extra time/assistive device  Expression   Expression assist level: Expresses basic needs/ideas: With extra time/assistive device  Social Interaction Social Interaction assist level: Interacts appropriately with  others with medication or extra time (anti-anxiety, antidepressant).  Problem Solving Problem solving assist level: Solves basic 75 - 89% of the time/requires cueing 10 - 24% of the time;Solves basic 90% of the time/requires cueing < 10% of the time  Memory Memory assist level: Recognizes or recalls 50 - 74% of the time/requires cueing 25 - 49% of the time    Pain    Therapy/Group: Individual Therapy  Kessler Kopinski 06/23/2017, 2:27 PM

## 2017-06-23 NOTE — Plan of Care (Signed)
  Consults RH STROKE PATIENT EDUCATION Description Patient and family will verbalize signs and symptoms of stroke prior to discharge.  06/23/2017 0543 - Progressing by Mason JimKooy, Esiah Bazinet, RN 06/23/2017 0543 - Progressing by Mason JimKooy, Jahaad Penado, RN   RH BOWEL ELIMINATION RH STG MANAGE BOWEL WITH ASSISTANCE Description STG Manage Bowel with  Mod I   06/23/2017 0543 - Progressing by Mason JimKooy, Ketty Bitton, RN 06/23/2017 0543 - Progressing by Mason JimKooy, Frisco Cordts, RN   RH SKIN INTEGRITY RH STG ABLE TO PERFORM INCISION/WOUND CARE W/ASSISTANCE Description STG Able To Perform Incision/Wound Care With mod I loop recorder area   06/23/2017 0543 - Progressing by Mason JimKooy, Adaia Matthies, RN 06/23/2017 0543 - Progressing by Mason JimKooy, Calli Bashor, RN   RH SAFETY RH STG DECREASED RISK OF FALL WITH ASSISTANCE Description STG Decreased Risk of Fall With  Mod I .  06/23/2017 0543 - Progressing by Mason JimKooy, Tareka Jhaveri, RN 06/23/2017 0543 - Progressing by Mason JimKooy, Khalaya Mcgurn, RN   RH COGNITION-NURSING RH STG USES MEMORY AIDS/STRATEGIES W/ASSIST TO PROBLEM SOLVE Description STG Uses Memory Aids/Strategies With  Mod I to Problem Solve.  06/23/2017 0543 - Progressing by Mason JimKooy, Tanishia Lemaster, RN 06/23/2017 0543 - Progressing by Mason JimKooy, Meilin Brosh, RN   RH PAIN MANAGEMENT RH STG PAIN MANAGED AT OR BELOW PT'S PAIN GOAL Description Less than 2   06/23/2017 0543 - Progressing by Mason JimKooy, Mouna Yager, RN 06/23/2017 0543 - Progressing by Mason JimKooy, Kal Chait, RN

## 2017-06-23 NOTE — Progress Notes (Signed)
Social Work Patient ID: [name], female   DOB: 06/11/1936, 82 y.o.   MRN: [id number]     Windi Toro, Tommy MedalJennifer C, LCSW  Social Worker    Patient Care Conference  Signed  Date of Service:  06/22/2017 10:33 PM          Signed           [] Hide copied text  [] Hover for details   Inpatient RehabilitationTeam Conference and Plan of Care Update Date: 06/21/2017   Time: 10:55 AM      Patient Name: Angela SheererReva R Goins      Medical Record Number: [id number]  Date of Birth: 01/07/1936 Sex: Female         Room/Bed: 4W19C/4W19C-01 Payor Info: Payor: MEDICARE / Plan: MEDICARE PART A AND B / Product Type: *No Product type* /     Admitting Diagnosis: CVA  Admit Date/Time:  06/20/2017  3:06 PM Admission Comments: No comment available    Primary Diagnosis:  <principal problem not specified> Principal Problem: <principal problem not specified>       Patient Active Problem List    Diagnosis Date Noted  . Cerebrovascular accident (CVA) due to occlusion of left posterior communicating artery (HCC) 06/20/2017  . Stroke (cerebrum) (HCC) 06/15/2017      Expected Discharge Date: Expected Discharge Date: 06/28/17   Team Members Present: Physician leading conference: Dr. Claudette LawsAndrew Kirsteins Social Worker Present: Staci AcostaJenny Jullie Arps, LCSW Nurse Present: Chana Bodeeborah Sharp, RN PT Present: Grier RocherAustin Tucker, PT;[name], PTA OT Present: Johnsie CancelAmy Lewis, OT SLP Present: Reuel DerbyHappi Overton, SLP PPS Coordinator present : Tora DuckMarie Noel, RN, CRRN       Current Status/Progress Goal Weekly Team Focus  Medical     BP controlled, R field cut/neglect , fall risk  reduce fall risk increase attn to R   initiate therapy    Bowel/Bladder     Continent of Bowel and Bladder LBM 06/18/16 ,   Remain Continent of bowel/bladder while on Rehab  Assess QS and prn, document and provide Laxative if no results with 3 days obtain prn laxative orders    Swallow/Nutrition/ Hydration               ADL's     Supervision-min A overall   Supervision IADLs, mod I basic ADLs. Recommending 24 hr supervisionat d/c  R side attention, cognitive remediation, safety awarenss, ADL/IADL retraining   Mobility     Min assist with transfers and gait, min-supervision assist bed mobility. R visual field deficits and mild receptive/expressive aphasia limit pt's safety awareness  Ambulatory at supervisio level without AD. mod I bed mobility and transfers  Safety awareness, balance, transfers, improved independence with gait.    Communication               Safety/Cognition/ Behavioral Observations   Max A to Mod A recall, Max A anticipatory awareness  Supervision  utilization of memory book, anticipatory awareness   Pain     Denies pain  < 2,   Assess QS and PRN   Skin     s/p Loop recorder 06/20/2017 left chest wall, Ecchymotic areas to bilateral arms/shoulders and Left knee  Maintain skin integrity  Assess QS and prn , document all changes and notify MD/PA if warranted     Rehab Goals Patient on target to meet rehab goals: Yes Rehab Goals Revised: none *See Care Plan and progress notes for long and short-term goals.      Barriers to Discharge   Current Status/Progress Possible  Resolutions Date Resolved   Physician     Medical stability     progressing well  Short LOS      Nursing                 PT  Decreased caregiver support;Home environment access/layout                 OT                 SLP            SW              Discharge Planning/Teaching Needs:  Pt to d/c home with family to provide 24/7 supervision.  D/C destination to be determined by pt and her family.  Family education to be offered to caregivers prior to d/c.   Team Discussion:  Pt with L PCA with aphasia and some balance and fine motor skills.  Doing well medically and was active PTA and plays pickle ball.  Pt has loop recorder and needs to have a bowel movement, but she has been refusing medications.  PT has set supervision level goals and is currently min A  with right inattention.  Pt told team she has a younger sister who will move in with her temporarily.    Revisions to Treatment Plan:  none    Continued Need for Acute Rehabilitation Level of Care: The patient requires daily medical management by a physician with specialized training in physical medicine and rehabilitation for the following conditions: Daily direction of a multidisciplinary physical rehabilitation program to ensure safe treatment while eliciting the highest outcome that is of practical value to the patient.: Yes Daily medical management of patient stability for increased activity during participation in an intensive rehabilitation regime.: Yes Daily analysis of laboratory values and/or radiology reports with any subsequent need for medication adjustment of medical intervention for : Neurological problems   Noely Kuhnle, [place] Deck 06/23/2017, 11:53 PM

## 2017-06-23 NOTE — Progress Notes (Signed)
Physical Therapy Session Note  Patient Details  Name: [name] MRN: [id number] Date of Birth: 10-31-1935  Today's Date: 06/23/2017 PT Individual Time: 0905-1000 PT Individual Time Calculation (min): 55 min   Short Term Goals: Week 1:  PT Short Term Goal 1 (Week 1): STG=LTG due to ELOS   Skilled Therapeutic Interventions/Progress Updates:   Pt received sitting in WC and agreeable to PT. Pt instructed in [place] mobility to rehab gym with min verbal cues to improved safety by decreasing speed and improved awareness of the R side.  PT instructed pt in DGI, see below for details. Pt demonstrates increased fall risk with score of 14/24 (score of <19 indicates increased risk) PT instructed pt inModified  [place] level A(LAQ, standing knee flexion, standing hip abduction, tandem stance, sit<>stand, mini-squats)  and Level B(forward/backward gait, figure 8 walking, side stepping, SLS) balance exercises close supervision assist to min assist from PT for safety with moderate cues for technique.  Pathfinding and short term recall activity to locate and return 3 "hidden" items ( apple sause in fridge, cereal in pantry, and pan in oven). Unable to recall more than 1 item or any location beyond 30 seconds.  Education to pt for improved awareness and insight to deficits nas well as recommendations for LOS at CIR to improve safety at d/c.   Patient returned to room and left sitting in [facility] with call bell in reach and all needs met.              Therapy Documentation Precautions:  Precautions Precautions: Fall Precaution Comments: right inattention, right hemianopsia Restrictions Weight Bearing Restrictions: No    Vital Signs: Therapy Vitals Temp: 98 F (36.7 C) Temp Source: Oral Pulse Rate: 71 Resp: 20 BP: 132/62 Patient Position (if appropriate): Sitting Oxygen Therapy SpO2: 98 % O2 Device: Not Delivered Pain:   0/10   See Function Navigator for Current Functional  Status.   Therapy/Group: Individual Therapy  Lorie Phenix 06/23/2017, 3:14 PM

## 2017-06-23 NOTE — Progress Notes (Signed)
Occupational Therapy Session Note  Patient Details  Name: Angela SheererReva R Shippy MRN: [id number] Date of Birth: 09/26/1935  Today's Date: 06/23/2017 OT Individual Time: 1100-1200 and 1300-1330 OT Individual Time Calculation (min): 60 min and 30 min   Short Term Goals: Week 1:  OT Short Term Goal 1 (Week 1): STG=LTG due to LOS  Skilled Therapeutic Interventions/Progress Updates:    Session One: Pt seen for OT session focusing on ADL re-training, functional transfers, and cognitive remediation. Pt sitting up in w/c, complaints of difficulty using cellphone due to visual deficits. Worked with orienting pt to hospital phone with larger numbers. Therapist read contacts phone numbers to pt for her to write down, pt placing paper far L of midline to read from paper.  She declined bathing/dressing this morning. She ambulated to ADL apartment, completed simulated shower stall transfer with VCs for technique. Then addressed memory and object identification, pt required to remember 3 items and locate in them kitchen, requiring max A to recall and mod A to locate items.  Then addressed naming of common items. Pt presented with various pieces of fruit, she was able to correctly name 5/10 pieces of fruit, requiring max multimodal cuing to name the 5 pieces she was unable to name independently and unable to recall name of fruits when presented with same item several minutes later. Pt returned to room at end of session, left seated in w/c with all needs in reach awaiting lunch tray.  Session Two: Pt seen for OT session focusing on family education/ d/c planning and cognitive remediation/ compensatory techniques. Pt sitting up in w/c upon arrival with sister present. Sister planning to exit session, however, quickly reviewed d/c plans as pt's sister will be one of her primary caregivers at d/c. Reviewed recommendation for 24 hr assist due to cognitive and visual deficits and gave examples and functional implications for  safety with deficits. Encouraged family to come for tx sessions as d/c date approaches. Pt desiring to d/c sooner, however, will greatly benefit from extended time on IPR.  When her sister left began reading activity using hospital food menu and techniques for scanning. When pt uses finger to target word, she is able to read with paper at midline, however, without compensatory techniques, pt places paper in far L position in order to read it. She was able to read bold font words with increased time and mod cuing, requiring max cuing to recall items following brief delay. Pt left seated in w/c at end of session, all needs in reach, reviewed use of call bell for mobility.   Therapy Documentation Precautions:  Precautions Precautions: Fall Precaution Comments: right inattention, right hemianopsia Restrictions Weight Bearing Restrictions: No Pain:     No/ denies pain  See Function Navigator for Current Functional Status.   Therapy/Group: Individual Therapy  Stevie Charter L 06/23/2017, 7:17 AM

## 2017-06-23 NOTE — Progress Notes (Signed)
Subjective/Complaints: Pt crying, wants to go home Sat or Sun rather than on Wed to avoid snow and allow daughter not to miss work ROS:  Neg CP,SOB, N/V/D  Objective: Vital Signs: Blood pressure (!) 147/78, pulse 66, temperature (!) 97.5 F (36.4 C), temperature source Oral, resp. rate 10, height _0  (1.6 m), SpO2 97 %. No results found. Results for orders placed or performed during the hospital encounter of 06/20/17 (from the past 72 hour(s))  CBC     Status: None   Collection Time: 06/20/17  6:28 PM  Result Value Ref Range   WBC 5.1 4.0 - 10.5 K/uL   RBC 4.03 3.87 - 5.11 MIL/uL   Hemoglobin 12.7 12.0 - 15.0 g/dL   HCT 38.6 36.0 - 46.0 %   MCV 95.8 78.0 - 100.0 fL   MCH 31.5 26.0 - 34.0 pg   MCHC 32.9 30.0 - 36.0 g/dL   RDW 12.7 11.5 - 15.5 %   Platelets 158 150 - 400 K/uL  Creatinine, serum     Status: None   Collection Time: 06/20/17  6:28 PM  Result Value Ref Range   Creatinine, Ser 0.81 0.44 - 1.00 mg/dL   GFR calc non Af Amer >60 >60 mL/min   GFR calc Af Amer >60 >60 mL/min    Comment: (NOTE) The eGFR has been calculated using the CKD EPI equation. This calculation has not been validated in all clinical situations. eGFR's persistently <60 mL/min signify possible Chronic Kidney Disease.   CBC WITH DIFFERENTIAL     Status: None   Collection Time: 06/21/17  8:50 AM  Result Value Ref Range   WBC 5.3 4.0 - 10.5 K/uL   RBC 4.06 3.87 - 5.11 MIL/uL   Hemoglobin 12.7 12.0 - 15.0 g/dL   HCT 38.7 36.0 - 46.0 %   MCV 95.3 78.0 - 100.0 fL   MCH 31.3 26.0 - 34.0 pg   MCHC 32.8 30.0 - 36.0 g/dL   RDW 12.5 11.5 - 15.5 %   Platelets 179 150 - 400 K/uL   Neutrophils Relative % 66 %   Neutro Abs 3.5 1.7 - 7.7 K/uL   Lymphocytes Relative 23 %   Lymphs Abs 1.2 0.7 - 4.0 K/uL   Monocytes Relative 7 %   Monocytes Absolute 0.4 0.1 - 1.0 K/uL   Eosinophils Relative 4 %   Eosinophils Absolute 0.2 0.0 - 0.7 K/uL   Basophils Relative 0 %   Basophils Absolute 0.0 0.0 - 0.1 K/uL   Comprehensive metabolic panel     Status: Abnormal   Collection Time: 06/21/17  8:50 AM  Result Value Ref Range   Sodium 139 135 - 145 mmol/L   Potassium 3.6 3.5 - 5.1 mmol/L   Chloride 105 101 - 111 mmol/L   CO2 25 22 - 32 mmol/L   Glucose, Bld 120 (H) 65 - 99 mg/dL   BUN 19 6 - 20 mg/dL   Creatinine, Ser 0.82 0.44 - 1.00 mg/dL   Calcium 9.3 8.9 - 10.3 mg/dL   Total Protein 6.2 (L) 6.5 - 8.1 g/dL   Albumin 3.6 3.5 - 5.0 g/dL   AST 26 15 - 41 U/L   ALT 21 14 - 54 U/L   Alkaline Phosphatase 68 38 - 126 U/L   Total Bilirubin 1.0 0.3 - 1.2 mg/dL   GFR calc non Af Amer >60 >60 mL/min   GFR calc Af Amer >60 >60 mL/min    Comment: (NOTE) The eGFR has been calculated  using the CKD EPI equation. This calculation has not been validated in all clinical situations. eGFR's persistently <60 mL/min signify possible Chronic Kidney Disease.    Anion gap 9 5 - 15     HEENT: normal Cardio: RRR and no murmur Resp: CTA B/L and unlabored GI: BS positive and NT, ND Extremity:  No Edema Skin:   Intact Neuro: Alert/Oriented, Normal Sensory in UE and LE, Normal Motor, Abnormal FMC Ataxic/ dec FMC, Aphasic and Inattention Musc/Skel:  Other no pain with UE or LE ROM Gen NAD   Assessment/Plan: 1. Functional deficits secondary to Left P2 PCA infarct with aphasia ,apraxia, fine motor def which require 3+ hours per day of interdisciplinary therapy in a comprehensive inpatient rehab setting. Physiatrist is providing close team supervision and 24 hour management of active medical problems listed below. Physiatrist and rehab team continue to assess barriers to discharge/monitor patient progress toward functional and medical goals. FIM: Function - Bathing Position: Wheelchair/chair at sink Body parts bathed by patient: Right arm, Left arm, Chest, Abdomen, Front perineal area, Buttocks, Right upper leg, Left upper leg Body parts bathed by helper: Back, Left lower leg, Right lower leg Assist Level: More  than reasonable time, Set up Set up : To obtain items  Function- Upper Body Dressing/Undressing What is the patient wearing?: Pull over shirt/dress Bra - Perfomed by patient: Thread/unthread right bra strap, Thread/unthread left bra strap, Hook/unhook bra (pull down sports bra) Pull over shirt/dress - Perfomed by patient: Thread/unthread left sleeve, Put head through opening, Pull shirt over trunk, Thread/unthread right sleeve Assist Level: Supervision or verbal cues, Set up Set up : To obtain clothing/put away Function - Lower Body Dressing/Undressing What is the patient wearing?: Underwear, Socks, Non-skid slipper socks Position: Sitting EOB Underwear - Performed by patient: Thread/unthread right underwear leg, Thread/unthread left underwear leg Pants- Performed by patient: Thread/unthread right pants leg, Pull pants up/down, Thread/unthread left pants leg Non-skid slipper socks- Performed by patient: Don/doff right sock, Don/doff left sock TED Hose - Performed by helper: Don/doff right TED hose, Don/doff left TED hose Assist for footwear: Supervision/touching assist Assist for lower body dressing: Touching or steadying assistance (Pt > 75%)  Function - Toileting Toileting steps completed by patient: Adjust clothing prior to toileting, Performs perineal hygiene, Adjust clothing after toileting Toileting Assistive Devices: Grab bar or rail Assist level: Touching or steadying assistance (Pt.75%), Set up/obtain supplies, More than reasonable time  Function - Air cabin crew transfer assistive device: Grab bar Assist level to toilet: Touching or steadying assistance (Pt > 75%) Assist level from toilet: Touching or steadying assistance (Pt > 75%)  Function - Chair/bed transfer Chair/bed transfer method: Stand pivot Chair/bed transfer assist level: Touching or steadying assistance (Pt > 75%) Chair/bed transfer assistive device: Bedrails Chair/bed transfer details: Verbal cues for  sequencing, Verbal cues for precautions/safety  Function - Locomotion: Wheelchair Will patient use wheelchair at discharge?: Yes Type: Manual Max wheelchair distance: 113f  Assist Level: Supervision or verbal cues Assist Level: Supervision or verbal cues Assist Level: Supervision or verbal cues Function - Locomotion: Ambulation Assistive device: No device Max distance: 4063f Assist level: Supervision or verbal cues Assist level: Supervision or verbal cues Assist level: Supervision or verbal cues Assist level: Supervision or verbal cues Assist level: Touching or steadying assistance (Pt > 75%)  Function - Comprehension Comprehension: Auditory Comprehension assist level: Follows basic conversation/direction with extra time/assistive device  Function - Expression Expression: Verbal Expression assist level: Expresses basic needs/ideas: With extra time/assistive device  Function - Social Interaction Social Interaction assist level: Interacts appropriately with others with medication or extra time (anti-anxiety, antidepressant).  Function - Problem Solving Problem solving assist level: Solves basic 75 - 89% of the time/requires cueing 10 - 24% of the time  Function - Memory Memory assist level: Recognizes or recalls 50 - 74% of the time/requires cueing 25 - 49% of the time Patient normally able to recall (first 3 days only): Current season, That he or she is in a hospital  Medical Problem List and Plan: 1.Right side weaknesssecondary tolarge PCA infarction. Patient did receive TPA- EC ASA -CIR PT, OT ,SLP ELOS 1/16- medically doing well , will need to check with PT/OT and SW what additional resources or equipment pt would need for early d/c 2. DVT Prophylaxis/Anticoagulation: Subcutaneous Lovenox. Monitor for any bleeding episodes, PLT 158 monitor 3. Pain Management:Tylenol as needed 4. Mood:Provide emotional support 5. Neuropsych: This patientiscapable of  making decisions on herown behalf. 6. Skin/Wound Care:Routine skin checks 7. Fluids/Electrolytes/Nutrition:Routine I&O's, creat nl 8.Hyperlipidemia. Lipitor    LOS (Days) 3 A FACE TO FACE EVALUATION WAS PERFORMED  Charlett Blake 06/23/2017, 8:12 AM

## 2017-06-24 ENCOUNTER — Inpatient Hospital Stay (HOSPITAL_COMMUNITY): Payer: Medicare Other | Admitting: Occupational Therapy

## 2017-06-24 ENCOUNTER — Inpatient Hospital Stay (HOSPITAL_COMMUNITY): Payer: Medicare Other | Admitting: Speech Pathology

## 2017-06-24 ENCOUNTER — Inpatient Hospital Stay (HOSPITAL_COMMUNITY): Payer: Medicare Other | Admitting: Physical Therapy

## 2017-06-24 DIAGNOSIS — R269 Unspecified abnormalities of gait and mobility: Secondary | ICD-10-CM

## 2017-06-24 DIAGNOSIS — I10 Essential (primary) hypertension: Secondary | ICD-10-CM

## 2017-06-24 DIAGNOSIS — R739 Hyperglycemia, unspecified: Secondary | ICD-10-CM

## 2017-06-24 NOTE — Progress Notes (Signed)
Subjective/Complaints: Patient seen lying in bed this morning. She states she slept well overnight. She denies complaints.  ROS:  Denies CP,SOB, N/V/D  Objective: Vital Signs: Blood pressure (!) 153/77, pulse 68, temperature 97.7 F (36.5 C), temperature source Oral, resp. rate 20, height 5\' 3"  (1.6 m), SpO2 99 %. No results found. No results found for this or any previous visit (from the past 72 hour(s)).   HEENT: Normocephalic. Atraumatic. Cardio: RRR and no JVD. Resp: CTA B/L and unlabored. GI: BS positive and ND Skin:   Intact. Warm and dry. Neuro: Alert/Oriented,  Motor:  Motor: Grossly 5/5 proximal to distal Mnimal/no ataxia bilateral upper extremities Musc/Skel:  No edema. No tenderness. Gen NAD. Vital signs reviewed. Psych: Slightly hyperactive  Assessment/Plan: 1. Functional deficits secondary to Left P2 PCA infarct with aphasia ,apraxia, fine motor def which require 3+ hours per day of interdisciplinary therapy in a comprehensive inpatient rehab setting. Physiatrist is providing close team supervision and 24 hour management of active medical problems listed below. Physiatrist and rehab team continue to assess barriers to discharge/monitor patient progress toward functional and medical goals. FIM: Function - Bathing Position: Wheelchair/chair at sink Body parts bathed by patient: Right arm, Left arm, Chest, Abdomen, Front perineal area, Buttocks, Right upper leg, Left upper leg Body parts bathed by helper: Back, Left lower leg, Right lower leg Assist Level: More than reasonable time, Set up Set up : To obtain items  Function- Upper Body Dressing/Undressing What is the patient wearing?: Pull over shirt/dress Bra - Perfomed by patient: Thread/unthread right bra strap, Thread/unthread left bra strap, Hook/unhook bra (pull down sports bra) Pull over shirt/dress - Perfomed by patient: Thread/unthread left sleeve, Put head through opening, Pull shirt over trunk,  Thread/unthread right sleeve Assist Level: Supervision or verbal cues, Set up Set up : To obtain clothing/put away Function - Lower Body Dressing/Undressing What is the patient wearing?: Underwear, Socks, Non-skid slipper socks Position: Sitting EOB Underwear - Performed by patient: Thread/unthread right underwear leg, Thread/unthread left underwear leg Pants- Performed by patient: Thread/unthread right pants leg, Pull pants up/down, Thread/unthread left pants leg Non-skid slipper socks- Performed by patient: Don/doff right sock, Don/doff left sock TED Hose - Performed by helper: Don/doff right TED hose, Don/doff left TED hose Assist for footwear: Supervision/touching assist Assist for lower body dressing: Touching or steadying assistance (Pt > 75%)  Function - Toileting Toileting steps completed by patient: Adjust clothing prior to toileting, Performs perineal hygiene, Adjust clothing after toileting Toileting Assistive Devices: Grab bar or rail Assist level: Touching or steadying assistance (Pt.75%), Set up/obtain supplies, More than reasonable time  Function - Archivist transfer assistive device: Grab bar Assist level to toilet: Touching or steadying assistance (Pt > 75%) Assist level from toilet: Touching or steadying assistance (Pt > 75%)  Function - Chair/bed transfer Chair/bed transfer method: Stand pivot Chair/bed transfer assist level: Touching or steadying assistance (Pt > 75%) Chair/bed transfer assistive device: Bedrails Chair/bed transfer details: Verbal cues for sequencing, Verbal cues for precautions/safety  Function - Locomotion: Wheelchair Will patient use wheelchair at discharge?: Yes Type: Manual Max wheelchair distance: 153ft  Assist Level: Supervision or verbal cues Assist Level: Supervision or verbal cues Assist Level: Supervision or verbal cues Function - Locomotion: Ambulation Assistive device: No device Max distance: 478ft  Assist level:  Supervision or verbal cues Assist level: Supervision or verbal cues Assist level: Supervision or verbal cues Assist level: Supervision or verbal cues Assist level: Touching or steadying assistance (Pt > 75%)  Function - Comprehension Comprehension: Auditory Comprehension assist level: Follows basic conversation/direction with extra time/assistive device  Function - Expression Expression: Verbal Expression assist level: Expresses basic needs/ideas: With extra time/assistive device  Function - Social Interaction Social Interaction assist level: Interacts appropriately with others with medication or extra time (anti-anxiety, antidepressant).  Function - Problem Solving Problem solving assist level: Solves basic 75 - 89% of the time/requires cueing 10 - 24% of the time  Function - Memory Memory assist level: Recognizes or recalls 50 - 74% of the time/requires cueing 25 - 49% of the time Patient normally able to recall (first 3 days only): Current season, That he or she is in a hospital  Medical Problem List and Plan: 1.Right side weaknesssecondary tolarge PCA infarction. Patient did receive TPA- EC ASA Continue CIR    Notes reviewed, images reviewed, labs reviewed   Per primary M.D., patient requesting to be discharged early, will discuss with therapies for functional progress and goals. 2. DVT Prophylaxis/Anticoagulation: Subcutaneous Lovenox. Monitor for any bleeding episodes, PLT 179 on 1/9 3. Pain Management:Tylenol as needed 4. Mood:Provide emotional support 5. Neuropsych: This patientis?fully capable of making decisions on herown behalf. 6. Skin/Wound Care:Routine skin checks 7. Fluids/Electrolytes/Nutrition:Routine I&O's, creat nl on 1/9 8.Hyperlipidemia. Lipitor 9. HTN:   Slightly labile, will consider meds if persistently elevated. 10. Hyperglycemia   Likely stress induced  LOS (Days) 4 A FACE TO FACE EVALUATION WAS PERFORMED  Ankit Karis Jubanil  Patel 06/24/2017, 9:38 AM

## 2017-06-24 NOTE — Progress Notes (Signed)
Physical Therapy Session Note  Patient Details  Name: [name] MRN: [id number] Date of Birth: 04-13-1936  Today's Date: 06/24/2017 PT Individual Time: 1100-1200 AND 1615-1700 PT Individual Time Calculation (min): 60 min   Short Term Goals: Week 1:  PT Short Term Goal 1 (Week 1): STG=LTG due to ELOS   Skilled Therapeutic Interventions/Progress Updates:    Pt received sitting in [facility] and agreeable to PT  PT instructed pt in gait training throughout hospital to locate gift shop. In gift shop pt instructed in object location and path finding for 3 objects( necklace, stuffed bear, and Mug). Moderate cues for improved visual scanning and problem solving to locate items outside visual field. PT also instructed pt in pathfind task to locate main entrance of hospital and return to [facility]. Moderate cues for use of signs in hospital and to develop compensatory strategy to read signs.   Pt educated on use of memory book to improve recall of events of therapy session.   Patient returned to room and left sitting in [facility] with call bell in reach and all needs met.    Session 2.  Pt received sitting in WC and agreeable to PT. WC mobility to rehab gym with supervision assist with min cues for safety. Nustep sustained attention task x 8 minutes with multiple cues required for proper speed and target time, level 5 throughout. Gait training through hall to [place] with supervision assist and min cues for doorway management. Visual scanning task and dual task with dynavison. Program A x 3, 4 rings progressing to A with Tscope. Pt noted to increase from 1.7sec reaction without T scope to 5 sec reaction time with Tscope. Educated on importance of performing 1 task at at time upon D/c to improve success and safety. Pt ambulated back to [facility] with supervision assist x 123f. WC mobility to room with supervision assist. [facility]                Therapy Documentation Precautions:  Precautions Precautions: Fall Precaution Comments: right inattention, right hemianopsia Restrictions Weight Bearing Restrictions: No Pain: 0/10   See Function Navigator for Current Functional Status.   Therapy/Group: Individual Therapy  ALorie Phenix1/05/2018, 4:17 PM

## 2017-06-24 NOTE — Progress Notes (Signed)
Speech Language Pathology Daily Session Note  Patient Details  Name: Angela SheererReva R Gilbert MRN: [id number] Date of Birth: 01/28/1936  Today's Date: 06/24/2017 SLP Individual Time: 1400-1430 SLP Individual Time Calculation (min): 30 min  Short Term Goals: Week 1: SLP Short Term Goal 1 (Week 1): Pt will utilize external memory aids to recall semi-complex daily information with supervision cues.  SLP Short Term Goal 2 (Week 1): Pt will locate items to right of midline with supervision cues.  SLP Short Term Goal 3 (Week 1): Pt will demonstrate anticipatory awareness by listing 3 activities that are safe to participate in at home with supervision cues.  SLP Short Term Goal 4 (Week 1): Pt will complete semi-complex medication management tasks with supervision cues.  SLP Short Term Goal 5 (Week 1): Pt will complete semi-complex money management tasks with supervision cues.   Skilled Therapeutic Interventions: Skilled treatment session focused on cognition goals. SLP facilitated session by providing Min A cues to utilize memory book for recall of information. Pt attempts to compensatory for visual deficits by turning book different directions and eventually is able to read information. Pt able to state awareness of deficits (balance, memory, vision) which is improvement over previous sessions but then pt states that she needs to get home to resume taking care of neighbor's children. Pt was left upright in bed with all needs within reach. Continue per current plan of care.      Function:    Cognition Comprehension Comprehension assist level: Follows basic conversation/direction with extra time/assistive device;Follows basic conversation/direction with no assist  Expression   Expression assist level: Expresses basic needs/ideas: With extra time/assistive device  Social Interaction Social Interaction assist level: Interacts appropriately with others with medication or extra time (anti-anxiety,  antidepressant).  Problem Solving Problem solving assist level: Solves basic 75 - 89% of the time/requires cueing 10 - 24% of the time;Solves basic 90% of the time/requires cueing < 10% of the time  Memory Memory assist level: Recognizes or recalls 50 - 74% of the time/requires cueing 25 - 49% of the time    Pain    Therapy/Group: Individual Therapy  Patrick Sohm 06/24/2017, 2:53 PM

## 2017-06-24 NOTE — Progress Notes (Signed)
Occupational Therapy Session Note  Patient Details  Name: Angela SheererReva R Gilbert MRN: [id number] Date of Birth: 05/22/1936  Today's Date: 06/24/2017 OT Individual Time: 0905-1005 OT Individual Time Calculation (min): 60 min   Skilled Therapeutic Interventions/Progress Updates:   participation and focus during batheing and dresssing today as follows:  Functional mobility and dynamic balance during ADL=  Good and walked short distances with this clinician's supervision without an assistive device  and patient was able to complete all aspects with S Right upper extremity strength and motor control in flexion past 90 degrees  Patient asked for this clinician's responses and words to be repeated throughout the session  -  Right visual attention (to address neglect) she utilized today and this clinician tended to position self to patient's right and reminded her from time to time to look further into her right visual field whether anyone is with her or not -  Visual accuity and processing  For writing and reading, patient stated her visual focus was "not right since the stroke."   She had difficulty seeing with "fuzziness" to write in her memory book -  Cognitive processing - appeared intact until patient required incorporating multi sensory and motor areas at the same time -  Memory - patient worked deligently in her memory book but had great difficulty remembering information and carrying over date from previous page in the book.       Also she required prompting to finally look back at prior date to come up with today's date  Patient did state that she feels she still needs to work on her memory.  At session's end, patient was left seated in her wheelchair with call bell and phone in place.     Therapy Documentation Precautions:  Precautions Precautions: Fall Precaution Comments: right inattention, right hemianopsia Restrictions Weight Bearing Restrictions: No Pain:denied       Therapy/Group: Individual Therapy  Bud Faceickett, Merly Hinkson Northeast Medical GroupYeary 06/24/2017, 9:39 AM

## 2017-06-25 ENCOUNTER — Inpatient Hospital Stay (HOSPITAL_COMMUNITY): Payer: Medicare Other

## 2017-06-25 DIAGNOSIS — I69319 Unspecified symptoms and signs involving cognitive functions following cerebral infarction: Secondary | ICD-10-CM

## 2017-06-25 MED ORDER — BLISTEX MEDICATED EX OINT
TOPICAL_OINTMENT | CUTANEOUS | Status: DC | PRN
  Filled 2017-06-25: qty 6.3

## 2017-06-25 NOTE — Progress Notes (Signed)
Occupational Therapy Session Note  Patient Details  Name: [name] MRN: [id number] Date of Birth: 10/28/1935  Today's Date: 06/25/2017 OT Individual Time: 0712-1975 OT Individual Time Calculation (min): 55 min    Short Term Goals: Week 1:  OT Short Term Goal 1 (Week 1): STG=LTG due to LOS  Skilled Therapeutic Interventions/Progress Updates:    1:1. Pt bathes at sit to stand level with superivison to wash buttocks and peri area. Pt already reporting washing UB prior to OT arrival. Pt unable to read pencil writing in memory book, however when given wide tip marker able to read writing. Pt ambulates throughout session with VC to walk without holding onto rails. Pt changes bed sheets, sequencing task without A and no LOB. Pt instructed to use signs to find chapel and requires mod question cues to utilize external aides to locate chapel and return to room. Exited session with pt seated in w/c and all needs met  Therapy Documentation Precautions:  Precautions Precautions: Fall Precaution Comments: right inattention, right hemianopsia Restrictions Weight Bearing Restrictions: No  See Function Navigator for Current Functional Status.   Therapy/Group: Individual Therapy  Tonny Branch 06/25/2017, 9:58 AM

## 2017-06-25 NOTE — Progress Notes (Addendum)
Physical Therapy Note  Patient Details  Name: [name] MRN: [id number] Date of Birth: 06/15/1935 Today's Date: 06/25/2017  1535-1605, 30 min individual tx Pain: none per pt  Pt reported that her RLE is numb, which is new.  PT informed Ed, Charity fundraiserN.    Advanced gait, kicking Yoga block with alternating feet, with RUE hand hold assist, fading to 0UE support, without LOB.  Up/down 12 steps 2 rails, alternating feet spontaneously, ascending/descending with close supervision.   Neuro re-ed:Seated in w/c use of Kinetron for alternating reciprocal movement x bil LEs at level 30 cm/sec x 20 cycles x 2.  Seated in w/c bil heel raises without UE support, focusing on upright posture.  Standing bil calf raises/toe raises with bil UE support, with immediate LOB backwards with each movement.  Multimodal cues for trunk flexion to prevent LOB.  Pt improved with practice but continues to have deficient balance strategies.  Pt left resting in w/c all needs within reach. Use of call bell reviewed with pt.  See function navigator for current status.  [name] 06/25/2017, 12:39 PM

## 2017-06-25 NOTE — Progress Notes (Signed)
Subjective/Complaints: Patient seen lying in bed this morning. She states she slept well overnight. She would like to go home, but understands that she needs to stay in rehabilitation.  ROS:  Denies CP,SOB, N/V/D  Objective: Vital Signs: Blood pressure (!) 131/93, pulse 70, temperature (!) 97.5 F (36.4 C), temperature source Oral, resp. rate 18, height 5\' 3"  (1.6 m), SpO2 100 %. No results found. No results found for this or any previous visit (from the past 72 hour(s)).   HEENT: Normocephalic. Atraumatic. Cardio: RRR and no JVD. Resp: CTA B/L and unlaboured. GI: BS positive and ND Skin:   Intact. Warm and dry. Neuro: Alert/Oriented,  Motor:  Motor: Grossly 5/5 proximal to distal (unchanged) Mnimal/no ataxia bilateral upper extremities Musc/Skel:  No edema. No tenderness. Gen NAD. Vital signs reviewed. Psych: Slightly hyperactive and anxious  Assessment/Plan: 1. Functional deficits secondary to Left P2 PCA infarct with aphasia ,apraxia, fine motor def which require 3+ hours per day of interdisciplinary therapy in a comprehensive inpatient rehab setting. Physiatrist is providing close team supervision and 24 hour management of active medical problems listed below. Physiatrist and rehab team continue to assess barriers to discharge/monitor patient progress toward functional and medical goals. FIM: Function - Bathing Position: Wheelchair/chair at sink Body parts bathed by patient: Right arm, Left arm, Chest, Abdomen, Front perineal area, Buttocks, Right upper leg, Left upper leg Body parts bathed by helper: Back, Left lower leg, Right lower leg Assist Level: More than reasonable time, Set up Set up : To obtain items  Function- Upper Body Dressing/Undressing What is the patient wearing?: Pull over shirt/dress Bra - Perfomed by patient: Thread/unthread right bra strap, Thread/unthread left bra strap, Hook/unhook bra (pull down sports bra) Pull over shirt/dress - Perfomed by  patient: Thread/unthread left sleeve, Put head through opening, Pull shirt over trunk, Thread/unthread right sleeve Assist Level: Supervision or verbal cues, Set up Set up : To obtain clothing/put away Function - Lower Body Dressing/Undressing What is the patient wearing?: Underwear, Socks, Non-skid slipper socks Position: Sitting EOB Underwear - Performed by patient: Thread/unthread right underwear leg, Thread/unthread left underwear leg Pants- Performed by patient: Thread/unthread right pants leg, Pull pants up/down, Thread/unthread left pants leg Non-skid slipper socks- Performed by patient: Don/doff right sock, Don/doff left sock TED Hose - Performed by helper: Don/doff right TED hose, Don/doff left TED hose Assist for footwear: Supervision/touching assist Assist for lower body dressing: Touching or steadying assistance (Pt > 75%)  Function - Toileting Toileting steps completed by patient: Adjust clothing prior to toileting, Performs perineal hygiene, Adjust clothing after toileting Toileting Assistive Devices: Grab bar or rail Assist level: No help/no cues  Function Programmer, multimedia transfer assistive device: Grab bar Assist level to toilet: Touching or steadying assistance (Pt > 75%) Assist level from toilet: Touching or steadying assistance (Pt > 75%)  Function - Chair/bed transfer Chair/bed transfer method: Stand pivot Chair/bed transfer assist level: Touching or steadying assistance (Pt > 75%) Chair/bed transfer assistive device: Bedrails Chair/bed transfer details: Verbal cues for sequencing, Verbal cues for precautions/safety  Function - Locomotion: Wheelchair Will patient use wheelchair at discharge?: Yes Type: Manual Max wheelchair distance: 141ft  Assist Level: Supervision or verbal cues Assist Level: Supervision or verbal cues Assist Level: Supervision or verbal cues Function - Locomotion: Ambulation Assistive device: No device Max distance: 476ft   Assist level: Supervision or verbal cues Assist level: Supervision or verbal cues Assist level: Supervision or verbal cues Assist level: Supervision or verbal cues Assist level: Touching  or steadying assistance (Pt > 75%)  Function - Comprehension Comprehension: Auditory Comprehension assist level: Follows basic conversation/direction with extra time/assistive device, Follows basic conversation/direction with no assist  Function - Expression Expression: Verbal Expression assist level: Expresses basic needs/ideas: With extra time/assistive device  Function - Social Interaction Social Interaction assist level: Interacts appropriately with others with medication or extra time (anti-anxiety, antidepressant).  Function - Problem Solving Problem solving assist level: Solves basic 75 - 89% of the time/requires cueing 10 - 24% of the time, Solves basic 90% of the time/requires cueing < 10% of the time  Function - Memory Memory assist level: Recognizes or recalls 50 - 74% of the time/requires cueing 25 - 49% of the time Patient normally able to recall (first 3 days only): Current season, That he or she is in a hospital, Location of own room, Staff names and faces  Medical Problem List and Plan: 1.Right side weaknesssecondary tolarge PCA infarction. Patient did receive TPA- EC ASA Continue CIR   Patient agreeable to stay until projected discharge date, she is more aware of her cognitive deficits. 2. DVT Prophylaxis/Anticoagulation: Subcutaneous Lovenox. Monitor for any bleeding episodes, PLT 179 on 1/9 3. Pain Management:Tylenol as needed 4. Mood:Provide emotional support 5. Neuropsych: This patientis?fully capable of making decisions on herown behalf. 6. Skin/Wound Care:Routine skin checks 7. Fluids/Electrolytes/Nutrition:Routine I&O's, creat nl on 1/9 8.Hyperlipidemia. Lipitor 9. HTN:   Slightly labile, will consider meds if persistently elevated.   Controlled on  1/13 10. Hyperglycemia   Likely stress induced  LOS (Days) 5 A FACE TO FACE EVALUATION WAS PERFORMED  [name] Karis Jubanil Harlowe Dowler 06/25/2017, 7:48 AM

## 2017-06-26 ENCOUNTER — Inpatient Hospital Stay (HOSPITAL_COMMUNITY): Payer: Medicare Other

## 2017-06-26 ENCOUNTER — Inpatient Hospital Stay (HOSPITAL_COMMUNITY): Payer: Medicare Other | Admitting: Occupational Therapy

## 2017-06-26 ENCOUNTER — Inpatient Hospital Stay (HOSPITAL_COMMUNITY): Payer: Medicare Other | Admitting: Physical Therapy

## 2017-06-26 ENCOUNTER — Encounter (HOSPITAL_COMMUNITY): Payer: Medicare Other | Admitting: Psychology

## 2017-06-26 MED ORDER — SENNOSIDES-DOCUSATE SODIUM 8.6-50 MG PO TABS
2.0000 | ORAL_TABLET | Freq: Two times a day (BID) | ORAL | Status: DC
  Filled 2017-06-26: qty 2

## 2017-06-26 NOTE — Progress Notes (Signed)
Speech Language Pathology Daily Session Note  Patient Details  Name: Angela SheererReva R Gilbert MRN: [id number] Date of Birth: 12/25/1935  Today's Date: 06/26/2017 SLP Individual Time: 0900-1000 SLP Individual Time Calculation (min): 60 min  Short Term Goals: Week 1: SLP Short Term Goal 1 (Week 1): Pt will utilize external memory aids to recall semi-complex daily information with supervision cues.  SLP Short Term Goal 2 (Week 1): Pt will locate items to right of midline with supervision cues.  SLP Short Term Goal 3 (Week 1): Pt will demonstrate anticipatory awareness by listing 3 activities that are safe to participate in at home with supervision cues.  SLP Short Term Goal 4 (Week 1): Pt will complete semi-complex medication management tasks with supervision cues.  SLP Short Term Goal 5 (Week 1): Pt will complete semi-complex money management tasks with supervision cues.   Skilled Therapeutic Interventions: Skilled ST services focused on cognitive goals. SLP facilitated recall of today's therapy events and orientation time/place/situation providing Supervision A question cues to utilize memory notebook. SLP facilitated anticipatory awareness skills discussing discharge plan and safe/unsafe activities pt can participate in when returning home, pt required min-supervision A verbal cues to list safe activities.Pt expresses desire to work hard to regain physical and cogntive abilities in order to demonstrate prior level of independence. Pt demonstrated ability to list current impairments with supervision A question cues and recall medication with supervision question cues. Pt demonstrated reading schedule and events in memory note book including scanning to left with extra time and supervision A question cues. Pt was left in room with call bell within reach. Recommend to continue skilled ST services.      Function:  Eating Eating   Modified Consistency Diet: No Eating Assist Level: More than reasonable amount  of time           Cognition Comprehension Comprehension assist level: Follows basic conversation/direction with no assist  Expression   Expression assist level: Expresses basic needs/ideas: With no assist  Social Interaction Social Interaction assist level: Interacts appropriately with others with medication or extra time (anti-anxiety, antidepressant).  Problem Solving Problem solving assist level: Solves basic 75 - 89% of the time/requires cueing 10 - 24% of the time;Solves basic 90% of the time/requires cueing < 10% of the time  Memory Memory assist level: Recognizes or recalls 75 - 89% of the time/requires cueing 10 - 24% of the time;Recognizes or recalls 90% of the time/requires cueing < 10% of the time    Pain Pain Assessment Pain Assessment: No/denies pain  Therapy/Group: Individual Therapy  Reymond Maynez  [facility] 06/26/2017, 11:03 AM

## 2017-06-26 NOTE — Progress Notes (Signed)
Without further complaint of numbness to RLE. LBM 01/11, requested and given prune juice at 0000. Initially refused scheduled ASA. Spoke with her at length about taking meds that are  Prescribed. Alfredo MartinezMurray, [name] A

## 2017-06-26 NOTE — Progress Notes (Signed)
Occupational Therapy Session Note  Patient Details  Name: Angela SheererReva R Gilbert MRN: [id number] Date of Birth: 07/26/1935  Today's Date: 06/26/2017 OT Individual Time: 1400-1500 OT Individual Time Calculation (min): 60 min    Short Term Goals: Week 1:  OT Short Term Goal 1 (Week 1): STG=LTG due to LOS  Skilled Therapeutic Interventions/Progress Updates:    Pt seen for OT session focusing on IADL goals, functional standing balance/ endurance, and cognitive remediation. Pt sitting up in w/c upon arrival, agreeable to tx session. She ambulated throughout session with supervision, cuing to not reach outside BOS to steady self on furniture.   She gathered laundry items and ambulated with clothes in arms to pt laundry facility. Completed laundry task with VCs for working laundry controls.  In therapy gym, completed x2 peg board puzzles while standing on non-compliant surface. First trial standing on standard foam mat, second trial standing on foam wedge. On standard mat, pt able to maintain balance with supervision, requiring min-mod A to obtain balance on foam wedge, however, once balance obtained, she was able to maintain with close supervision. With increased time, pt able to correctly replicate moderately difficult peg board pattern from picture.  During seated rest breaks EOM, addressed word finding and object recognition, pt required to name pieces of common fruit when presented with plastic replica of fruit. Pt able to correctly identify ~50% of fruit pieces independently and 100% accuracy when given the first letter of fruit. Pt unable to recall names of fruits btwn trials however. At end of session, pt able to recall need to check laundry. She switched items from washer to dryer with supervision. Required min cuing and max A for R vs.L when navigating back to room. Pt left seated in w/c at end of session, all needs in reach, reviewed need for use of call bell for assist with mobility.   Therapy  Documentation Precautions:  Precautions Precautions: Fall Precaution Comments: right inattention, right hemianopsia Restrictions Weight Bearing Restrictions: No Pain:   No/ denies pain  See Function Navigator for Current Functional Status.   Therapy/Group: Individual Therapy  Doroteo Nickolson L 06/26/2017, 3:07 PM

## 2017-06-26 NOTE — Progress Notes (Signed)
Occupational Therapy Note  Patient Details  Name: Angela SheererReva R Gilbert MRN: [id number] Date of Birth: 06/01/1936  Goals downgraded to supervision overall due to pt progress. See POC for goal details.  Ruffus Kamaka L 06/26/2017, 12:52 PM

## 2017-06-26 NOTE — Progress Notes (Signed)
Occupational Therapy Session Note  Patient Details  Name: Angela SheererReva R Openshaw MRN: [id number] Date of Birth: 06/19/1935  Today's Date: 06/26/2017 OT Individual Time: 4098-11910730-0830 OT Individual Time Calculation (min): 60 min    Short Term Goals: Week 1:  OT Short Term Goal 1 (Week 1): STG=LTG due to LOS  Skilled Therapeutic Interventions/Progress Updates:    Pt seen for OT ADL bathing/drezsszing session. Pt sitting up in wc upon arrival, agreeable to tx session. He ambulated throughout room with supervision, no AD< however, requiring VCs not to reach outside BOS to stabilizse self on furniture. Grooming tasks completed standing at sink, good awareness to R side and using B UEs appropriately. She ambulated to gather clothing items in prep showering task. She doffed clothing in standing position in bathroom, min steadying assist and needing to use grab bar to steady self. She bathed seated in shower, standing with grab bar to compeleted pericare/buttock hygiene.  She dressed seated on toilet with supervision.  She ambulated to gym with supervision. Completed floor transfer in simulation of fall. Provided extensive education regarding pt's high fall risk as well as what to do in case of fall and home modification/ set-up to prevent fall.  Pt returned to room at end of session, ambulated while bouncing ball, min A overall with occasional LOB to the L. Used memory notebook at end of session, min VCs to recall events of session and max A required for spelling of basic words.  Pt left seated in w/c at end of session, all needs in reach, reviewed use of call bell and need for assist with mobility.  Pt cont demonstrate some decreased safety awareness requiring intermittent cues for safety throughout. She is very motivated to return home and return to very active PLOF.   Therapy Documentation Precautions:  Precautions Precautions: Fall Precaution Comments: right inattention, right  hemianopsia Restrictions Weight Bearing Restrictions: No Pain:   No/ denies pain  See Function Navigator for Current Functional Status.   Therapy/Group: Individual Therapy  Miachel Nardelli L 06/26/2017, 7:07 AM

## 2017-06-26 NOTE — Progress Notes (Signed)
Subjective/Complaints:  No issues overnite, +constipation, wants to minimize meds ROS:  Denies CP,SOB, N/V/D  Objective: Vital Signs: Blood pressure 136/73, pulse 67, temperature 97.7 F (36.5 C), temperature source Oral, resp. rate 18, height 5\' 3"  (1.6 m), SpO2 96 %. No results found. No results found for this or any previous visit (from the past 72 hour(s)).   HEENT: Normocephalic. Atraumatic. Cardio: RRR and no JVD. Resp: CTA B/L and unlaboured. GI: BS positive and ND Skin:   Intact. Warm and dry. Neuro: Alert/Oriented,  + Romberg falls to Left Motor: Grossly 5/5 proximal to distal (unchanged) Mnimal/no ataxia bilateral upper extremities Musc/Skel:  No edema. No tenderness. Gen NAD. Vital signs reviewed. Psych: Slightly hyperactive and anxious  Assessment/Plan: 1. Functional deficits secondary to Left P2 PCA infarct with aphasia ,apraxia, fine motor def which require 3+ hours per day of interdisciplinary therapy in a comprehensive inpatient rehab setting. Physiatrist is providing close team supervision and 24 hour management of active medical problems listed below. Physiatrist and rehab team continue to assess barriers to discharge/monitor patient progress toward functional and medical goals. FIM: Function - Bathing Position: Wheelchair/chair at sink Body parts bathed by patient: Right arm, Left arm, Chest, Abdomen, Front perineal area, Buttocks, Right upper leg, Left upper leg Body parts bathed by helper: Back, Left lower leg, Right lower leg Assist Level: More than reasonable time, Set up Set up : To obtain items  Function- Upper Body Dressing/Undressing What is the patient wearing?: Pull over shirt/dress Bra - Perfomed by patient: Thread/unthread right bra strap, Thread/unthread left bra strap, Hook/unhook bra (pull down sports bra) Pull over shirt/dress - Perfomed by patient: Thread/unthread left sleeve, Put head through opening, Pull shirt over trunk,  Thread/unthread right sleeve Assist Level: Supervision or verbal cues, Set up Set up : To obtain clothing/put away Function - Lower Body Dressing/Undressing What is the patient wearing?: Underwear, Socks, Non-skid slipper socks Position: Sitting EOB Underwear - Performed by patient: Thread/unthread right underwear leg, Thread/unthread left underwear leg Pants- Performed by patient: Thread/unthread right pants leg, Pull pants up/down, Thread/unthread left pants leg Non-skid slipper socks- Performed by patient: Don/doff right sock, Don/doff left sock TED Hose - Performed by helper: Don/doff right TED hose, Don/doff left TED hose Assist for footwear: Supervision/touching assist Assist for lower body dressing: Touching or steadying assistance (Pt > 75%)  Function - Toileting Toileting steps completed by patient: Adjust clothing prior to toileting, Performs perineal hygiene, Adjust clothing after toileting Toileting Assistive Devices: Grab bar or rail Assist level: Supervision or verbal cues  Function Programmer, multimedia- Toilet Transfers Toilet transfer assistive device: Grab bar Assist level to toilet: Touching or steadying assistance (Pt > 75%) Assist level from toilet: Touching or steadying assistance (Pt > 75%)  Function - Chair/bed transfer Chair/bed transfer method: Stand pivot Chair/bed transfer assist level: Touching or steadying assistance (Pt > 75%) Chair/bed transfer assistive device: Bedrails Chair/bed transfer details: Verbal cues for sequencing, Verbal cues for precautions/safety  Function - Locomotion: Wheelchair Will patient use wheelchair at discharge?: Yes Type: Manual Max wheelchair distance: 1350ft  Assist Level: Supervision or verbal cues Assist Level: Supervision or verbal cues Assist Level: Supervision or verbal cues Function - Locomotion: Ambulation Assistive device: No device Max distance: 48000ft  Assist level: Supervision or verbal cues Assist level: Supervision or verbal  cues Assist level: Supervision or verbal cues Assist level: Supervision or verbal cues Assist level: Touching or steadying assistance (Pt > 75%)  Function - Comprehension Comprehension: Auditory Comprehension assist level: Understands complex 90%  of the time/cues 10% of the time  Function - Expression Expression: Verbal Expression assist level: Expresses basic needs/ideas: With extra time/assistive device  Function - Social Interaction Social Interaction assist level: Interacts appropriately with others with medication or extra time (anti-anxiety, antidepressant).  Function - Problem Solving Problem solving assist level: Solves basic 75 - 89% of the time/requires cueing 10 - 24% of the time, Solves basic 90% of the time/requires cueing < 10% of the time  Function - Memory Memory assist level: Recognizes or recalls 50 - 74% of the time/requires cueing 25 - 49% of the time Patient normally able to recall (first 3 days only): Current season, That he or she is in a hospital, Location of own room, Staff names and faces  Medical Problem List and Plan: 1.Right side weaknesssecondary tolarge PCA infarction. Patient did receive TPA- EC ASA Continue CIR, PT, OT, plan D/C in am   Patient agreeable to stay until projected discharge date, she is more aware of her cognitive deficits. 2. DVT Prophylaxis/Anticoagulation: Subcutaneous Lovenox. Monitor for any bleeding episodes, PLT 179 on 1/9, will d/c prior to discharge 3. Pain Management:Tylenol as needed 4. Mood:Provide emotional support 5. Neuropsych: This patientis?fully capable of making decisions on herown behalf. 6. Skin/Wound Care:Routine skin checks 7. Fluids/Electrolytes/Nutrition:Routine I&O's, creat nl on 1/9 8.Hyperlipidemia. Lipitor 9. HTN: Vitals:   06/25/17 1455 06/26/17 0457  BP: 137/74 136/73  Pulse: 72 67  Resp: 20 18  Temp: 98 F (36.7 C) 97.7 F (36.5 C)  SpO2: 100% 96%     Controlled on  1/14 10.  On Protonix no abd c/o will d/c and monitor 11.  Constipation , add stool softener LOS (Days) 6 A FACE TO FACE EVALUATION WAS PERFORMED  [name] 06/26/2017, 7:54 AM

## 2017-06-26 NOTE — Progress Notes (Signed)
Physical Therapy Session Note  Patient Details  Name: [name] MRN: [id number] Date of Birth: 05-25-1936  Today's Date: 06/26/2017 PT Individual Time: 1040-1105 PT Individual Time Calculation (min): 25 min   Short Term Goals: Week 1:  PT Short Term Goal 1 (Week 1): STG=LTG due to ELOS   Skilled Therapeutic Interventions/Progress Updates: Pt presented in w/c agreeable to therapy. Performed transfers from w/c supervision and ambulated to [facility] [place] gym supervision. Participated in [employer] activities including LOS and PTA providing multimodal cues for wt shifting vs leading with shoulders to introduce hip and ankle strategies. Pt performed wt shifting on level tile a-p and medial/lateral then attempting on [employer] on wt shifting setting for carryover and feedback. Pt with improved wt shifting use of hip/ankle strategy medial/lateral and limited carryover a-p. Pt ambulated back to room in same manner as prior and performed heel walk, toe walk, and hell/toe raises at wall rail outside room. Pt returned to w/c at end of session with call bell within reach and needs met.      Therapy Documentation Precautions:  Precautions Precautions: Fall Precaution Comments: right inattention, right hemianopsia Restrictions Weight Bearing Restrictions: No General:   Vital Signs: Therapy Vitals Temp: 97.6 F (36.4 C) Temp Source: Oral Pulse Rate: 71 Resp: 18 BP: 119/67 Patient Position (if appropriate): Sitting Oxygen Therapy SpO2: 99 % O2 Device: Not Delivered  See Function Navigator for Current Functional Status.   Therapy/Group: Individual Therapy  Kynzee Devinney  Markeise Mathews, PTA  06/26/2017, 4:05 PM

## 2017-06-27 ENCOUNTER — Inpatient Hospital Stay (HOSPITAL_COMMUNITY): Payer: Medicare Other | Admitting: Occupational Therapy

## 2017-06-27 ENCOUNTER — Inpatient Hospital Stay (HOSPITAL_COMMUNITY): Payer: Medicare Other | Admitting: Physical Therapy

## 2017-06-27 ENCOUNTER — Inpatient Hospital Stay (HOSPITAL_COMMUNITY): Payer: Medicare Other

## 2017-06-27 LAB — CREATININE, SERUM
Creatinine, Ser: 0.82 mg/dL (ref 0.44–1.00)
GFR calc Af Amer: >60 mL/min (ref >60)
GFR calc non Af Amer: >60 mL/min (ref >60)

## 2017-06-27 NOTE — Discharge Summary (Signed)
[name], [name] NO.:  [id number]  MEDICAL RECORD NO.:  [id number]  LOCATION:  4W19C                        FACILITY:  [facility]  PHYSICIAN:  Erick Colace, M.D.DATE OF BIRTH:  Oct 19, 1935  DATE OF ADMISSION:  06/20/2017 DATE OF DISCHARGE:  06/28/2017                              DISCHARGE SUMMARY   DISCHARGE DIAGNOSES: 1. Large posterior cerebral artery infarction, status post tPA. 2. Subcutaneous Lovenox for deep vein thrombosis prophylaxis. 3. Pain management. 4. Hyperlipidemia. 5. Hypertension. 6. Gastroesophageal reflux disease. 7. Constipation.  This is a 82 year old right-handed female with unremarkable past medical history, on no prescription medications, lives alone, independent prior to admission.  Family in the area checks on her as needed.  Presented on June 15, 2017, to [facility] with sudden onset of dizziness and right-sided weakness, slurred speech, and blurred vision as well as a fall.  Cranial CT scan negative.  The patient did receive tPA.  CT cerebral perfusion scan as well as CT angiogram of head and neck showed a left P2 segment occlusion.  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 Echocardiogram with ejection fraction of 70%.  No wall motion abnormalities.  Followup CT of the head on June 18, 2017, showed some increased edema and mass effect associated with left PCA infarction and continued to monitor.  Neurology followup, maintained on aspirin for CVA prophylaxis, subcutaneous Lovenox for DVT prophylaxis.  TEE showed ejection fraction of 65%.  Negative bubble study.  No thrombus maintained on a regular diet.  Physical and occupational therapy ongoing.  The patient was admitted for a comprehensive rehab program.  PAST MEDICAL HISTORY:  See discharge diagnoses.  SOCIAL HISTORY:  She lives alone, 2-level home, independent prior to admission.  FUNCTIONAL  STATUS UPON ADMISSION TO REHAB SERVICES:  Moderate assist 30 feet 2-person handheld assist; minimal assist sit to stand; min to mod assist activities daily living.  PHYSICAL EXAMINATION:  VITAL SIGNS:  Blood pressure 152/72, pulse 59, temperature 97, and respirations 16. GENERAL:  Alert female.  Mood was flat, but appropriate.  Followed commands.  Reasonable insight and awareness. HEENT:  EOMs intact. NECK:  Supple.  Nontender.  No JVD. CARDIAC:  Rate controlled. ABDOMEN:  Soft, nontender.  Good bowel sounds. LUNGS:  Clear to auscultation without wheeze.  REHABILITATION HOSPITAL COURSE:  The patient was admitted to Inpatient Rehab Services with therapies initiated on a 3-hour daily basis, consisting of physical therapy, occupational therapy, speech therapy, and rehabilitation nursing.  The following issues were addressed during the patient's rehabilitation stay.  Pertaining to [name]'s large PCA infarction, remained stable, maintained on aspirin therapy.  She did receive tPA.  The patient would follow up with Neurology Services. Subcutaneous Lovenox for DVT prophylaxis.  No bleeding episodes.  Blood pressures controlled, on no current antihypertensive medications.  She remained on Lipitor for hyperlipidemia.  Bouts of gastroesophageal reflux, maintained on Protonix with good results as well as bouts of constipation with laxative assistance, no nausea or vomiting.  The patient received weekly collaborative interdisciplinary team conferences to discuss estimated length of stay, family teaching, any barriers to her discharge.  Performed transfers with supervision.  Ambulates  to the gymnasium with supervision.  Needed some cues for shifting overweight. She ambulated back to her room after sessions in the same manner. Performed heel walk, toe walk, and heel to toe raises at wall rail outside of her room.  Gather belongings for activities of daily living and homemaking.  Working with  energy conservation techniques.  Speech therapy followup for any cognitive deficits, awareness.  Speech Therapy facilitated recall of her therapy events and orientation time, place, situation, providing supervision assist.  Facilitated anticipatory awareness skills, discussing discharge plan and safe and unsafe activities.  Demonstrated ability to list current impairments with supervision assist.  Full family teaching was completed and plan discharge to home.  DISCHARGE MEDICATIONS: 1. Aspirin 325 mg p.o. daily. 2. Lipitor 20 mg p.o. daily. 3. Protonix 40 mg p.o. daily. 4. Senokot-S 2 tablets p.o. b.i.d. 5. Tylenol as needed.  DIET:  Her diet was regular.  FOLLOWUP:  She would follow up with Dr. Claudette LawsAndrew Kirsteins [facility] as directed; Dr. Delia HeadyPramod Sethi, Neurology Services, call for appointment for 6 weeks; Dr. Hillis RangeJames Allred, Cardiology Services.  Care Management arranging plans for PCP.     Angela Dollaraniel Novelle Gilbert, P.A.   ______________________________ Erick ColaceAndrew E. Kirsteins, M.D.    DA/MEDQ  D:  06/27/2017  T:  06/27/2017  Job:  [id number]  cc:   Erick ColaceAndrew E. Kirsteins, M.D. Pramod P. Pearlean BrownieSethi, MD Hillis RangeJames Allred, MD

## 2017-06-27 NOTE — Consult Note (Signed)
Neuropsychological Consultation   Patient:   [name]   DOB:   05/11/1936  MR Number:  [id number]  [facility]  [facility] [id number] [place] [id number] Dept: [phone] Loc: [phone]           Date of Service:   06/26/2017  Start Time:   11 AM End Time:   12 PM  Provider/Observer:  Arley PhenixJohn [name], Psy.D.       Clinical Neuropsychologist       Billing Code/Service: [phone] 4 Units  Chief Complaint:    Angela SheererReva R Dorion is a 82 year old female with no significant past medical history.  The patient presented on 06/15/2017 with sudden onset of dizziness and right-sided weakness, slurred speech, blurred vision and fall.  Patient did receive TPA.  CT of head on 06/18/2017 showed increased edema and mass-effect associated with left PCA infarction.  The patient's primary complaints right now have to do with vision changes that persist.  She is having difficulty adjusting to having a significant change in her overall functioning after a relatively healthy life up to this point.  The patient denies significant issues of depression but anxiety and stresses noted.  Reason for Service:  IHK:VQQVHPI:Ellory R Terrellis a 82 y.o.right handed femalewith unremarkable past medical history on no prescription medications. Per chart review patient lives alone independent prior to admission. Two-level home with bedroom and bathroom on first floor.Family live in the area questioned assistance on discharge.[facility] with[facility]udden onset of dizziness with right-sided weakness, slurred speech,blurred visionand fall. Cranial CT scan negative. Patient did receive TPA. CT cerebral perfusion scan as well as CT angiogram head and neck showed left P2 segment occlusion. 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 Echocardiogram with ejection fraction of  70% no wall motion abnormalities.Follow-up CT of the head 06/18/2017 showed some increased edema and mass effect associated with left PCA infarction and continue to monitor.Neurology follow-upand currently maintained on aspirin for CVA prophylaxis. Subcutaneous Lovenox for DVT prophylaxis.TEEshowed ejection fraction 65%. Negative bubble study. No thrombusnoted.Maintained on a regular diet.Physical and occupational therapy evaluation completed 06/16/2017 with recommendations of physical medicine rehabilitation consult. Patient was admitted for a comprehensive rehabilitation program    Current Status:  The patient does return reports some issues of worry and stress primarily around difficulty with vision.  She reports that this is different than simply needing glasses.  The patient reports that she is having some balance issues.  Medical History:   Past Medical History:  Diagnosis Date  . Stroke [facility](HCC)        Family Med/Psych History:  Family History  Problem Relation Age of Onset  . Heart attack Father   . Breast cancer Sister   . Breast cancer Brother      Impression/DX:  Angela SheererReva R Ayala is a 82 year old female with no significant past medical history.  The patient presented on 06/15/2017 with sudden onset of dizziness and right-sided weakness, slurred speech, blurred vision and fall.  Patient did receive TPA.  CT of head on 06/18/2017 showed increased edema and mass-effect associated with left PCA infarction.  The patient's primary complaints right now have to do with vision changes that persist.  She is having difficulty adjusting to having a significant change in her overall functioning after a relatively healthy life up to this point.  The patient denies significant issues of depression but anxiety and stresses noted.  Diagnosis:    Cerebrovascular accident (CVA) due to occlusion of left posterior communicating artery [facility]) - Plan: Ambulatory referral to Physical Medicine Rehab          Electronically Signed   _______________________ Arley Phenix, Psy.D.

## 2017-06-27 NOTE — Progress Notes (Signed)
Speech Language Pathology Discharge Summary  Patient Details  Name: [name] MRN: [id number] Date of Birth: Jan 18, 1936  Today's Date: 06/27/2017 SLP Individual Time: 1300-1400 SLP Individual Time Calculation (min): 60 min   Skilled Therapeutic Interventions:  Skilled ST services focused on cognitive skills and family education. SLP administered informal cognitive linguistic assessment MOCA version 7.2 scoring 17 out 30 ( n=>26), explaining results to pt and family member (sisters in charge of 24/7 supervision) continued impairments in recall and problem solving resulting in the need for supervision A. Pt and family members agreed. SLP facilitated simple money manage skills, pt required extra time and Min A verbals to aid in problem solving and working memory. Pt stated agreement with continued deficits. SLP answered questions about continued recommended skilled ST services and supervision A requiring 24/7 care, pt and sisters stated understanding and all questions were answered to satisfaction. Pt was left in room with sisters.     Patient has met 4 of 4 long term goals.  Patient to discharge at overall Supervision;Min level.  Reasons goals not met:     Clinical Impression/Discharge Summary:   Pt demonstrated good progress meeting 4 out 4 goals, discharging at overall Min-Supervision level.Pt continues to require assistance in recall of information, problem solving, emergent/anticipatory awareness and mild word finding skills. Pt would continue to benefit from skilled ST services in order to maximize functional independence and reduce burden of care requiring 24 hour supervision due to cognitive deficits.   Care Partner:  Caregiver Able to Provide Assistance: Yes  Type of Caregiver Assistance: Physical;Cognitive  Recommendation:  Outpatient SLP;24 hour supervision/assistance  Rationale for SLP Follow Up: Maximize cognitive function and independence;Reduce caregiver burden   Equipment:  N/A   Reasons for discharge: Discharged from hospital   Patient/Family Agrees with Progress Made and Goals Achieved: Yes   Function:  Eating Eating                 Cognition Comprehension Comprehension assist level: Follows basic conversation/direction with no assist  Expression   Expression assist level: Expresses basic needs/ideas: With no assist  Social Interaction Social Interaction assist level: Interacts appropriately with others with medication or extra time (anti-anxiety, antidepressant).  Problem Solving Problem solving assist level: Solves basic 75 - 89% of the time/requires cueing 10 - 24% of the time;Solves basic 90% of the time/requires cueing < 10% of the time  Memory Memory assist level: Recognizes or recalls 75 - 89% of the time/requires cueing 10 - 24% of the time;Recognizes or recalls 90% of the time/requires cueing < 10% of the time   Amenah Tucci  [facility] 06/27/2017, 4:56 PM

## 2017-06-27 NOTE — Progress Notes (Signed)
Physical Therapy Session Note  Patient Details  Name: [name] MRN: [id number] Date of Birth: 24-Aug-1935  Today's Date: 06/27/2017 PT Individual Time: 0900-1000 PT Individual Time Calculation (min): 60 min   Short Term Goals: Week 1:  PT Short Term Goal 1 (Week 1): STG=LTG due to ELOS   Skilled Therapeutic Interventions/Progress Updates: Pt presented in w/c agreeable to therapy. Pt participated in functional activities as noted in function tab. Pt able to perform all activities at supervision or mod I level. Pt does continue to demonstrate decreased safety awareness and limited insight into deficits as pt was inquiring about renewing her drivers license. Pt participated in standing on red wedge and playing checkers for sustained attention and cognitive remediation. Pt required min cues for sustained task. Pt ambulated back to room and returned to w/c with needs met.      Therapy Documentation Precautions:  Precautions Precautions: Fall Precaution Comments: right inattention, right hemianopsia Restrictions Weight Bearing Restrictions: No General:   Vital Signs: Therapy Vitals Temp: 98.2 F (36.8 C) Temp Source: Oral Pulse Rate: 72 Resp: 16 BP: 131/76 Patient Position (if appropriate): Sitting Oxygen Therapy SpO2: 96 % O2 Device: Not Delivered   See Function Navigator for Current Functional Status.   Therapy/Group: Individual Therapy  Shalisha Clausing  Kielan Dreisbach, PTA  06/27/2017, 3:17 PM

## 2017-06-27 NOTE — Progress Notes (Signed)
Physical Therapy Discharge Summary  Patient Details  Name: [name] MRN: [id number] Date of Birth: 1935/11/14  Today's Date: 06/27/2017    Patient has met 9 of 9 long term goals due to improved activity tolerance, improved balance, improved postural control, increased strength, improved attention, improved awareness and improved coordination.  Patient to discharge at an ambulatory level Mod I to Supervision.   Patient's care partner is independent to provide the necessary physical assistance at discharge.  Reasons goals not met: N/A  Recommendation:  Patient will benefit from ongoing skilled PT services in outpatient setting to continue to advance safe functional mobility, address ongoing impairments in balance, awareness, safety, coordination, and minimize fall risk.  Equipment: No equipment provided  Reasons for discharge: treatment goals met and discharge from hospital  Patient/family agrees with progress made and goals achieved: Yes  PT Discharge Precautions/Restrictions Precautions Precautions: Fall Precaution Comments: right inattention, right hemianopsia Restrictions Weight Bearing Restrictions: No Vital Signs Therapy Vitals Temp: 98.9 F (37.2 C) Temp Source: Oral Pulse Rate: 65 Resp: 16 BP: 140/74 Patient Position (if appropriate): Sitting Oxygen Therapy SpO2: 98 % O2 Device: Not Delivered Pain Pain Assessment Pain Assessment: No/denies pain Pain Score: 0-No pain Vision/Perception     Cognition Orientation Level: Oriented X4 Sensation Sensation Light Touch: Impaired Detail Light Touch Impaired Details: Impaired RUE Motor  Motor Motor: Within Functional Limits  Mobility Bed Mobility Bed Mobility: Rolling Left;Rolling Right Rolling Right: 6: Modified independent (Device/Increase time) Supine to Sit: 6: Modified independent (Device/Increase time) Sit to Supine: 6: Modified independent (Device/Increase time) Transfers Transfers: Yes Sit to  Stand: 6: Modified independent (Device/Increase time) Stand Pivot Transfers: 6: Modified independent (Device/Increase time) Locomotion  Ambulation Ambulation: Yes Ambulation/Gait Assistance: 5: Supervision Ambulation Distance (Feet): 400 Feet Assistive device: None Gait Gait: Yes Gait Pattern: Impaired Gait Pattern: Ataxic;Step-through pattern;Narrow base of support Stairs / Additional Locomotion Stairs: Yes Stairs Assistance: 5: Supervision Stair Management Technique: One rail Left Number of Stairs: 12 Wheelchair Mobility Wheelchair Mobility: No  Trunk/Postural Assessment  Cervical Assessment Cervical Assessment: Within Functional Limits Thoracic Assessment Thoracic Assessment: Within Functional Limits Lumbar Assessment Lumbar Assessment: Within Functional Limits Postural Control Postural Control: Deficits on evaluation  Balance Balance Balance Assessed: Yes Berg Balance Test Sit to Stand: Able to stand without using hands and stabilize independently Standing Unsupported: Able to stand safely 2 minutes Sitting with Back Unsupported but Feet Supported on Floor or Stool: Able to sit safely and securely 2 minutes Stand to Sit: Sits safely with minimal use of hands Transfers: Able to transfer safely, minor use of hands Standing Unsupported with Eyes Closed: Able to stand 10 seconds safely Standing Ubsupported with Feet Together: Able to place feet together independently and stand for 1 minute with supervision From Standing, Reach Forward with Outstretched Arm: Can reach confidently >25 cm (10") From Standing Position, Pick up Object from Floor: Able to pick up shoe safely and easily From Standing Position, Turn to Look Behind Over each Shoulder: Looks behind one side only/other side shows less weight shift Turn 360 Degrees: Able to turn 360 degrees safely in 4 seconds or less Standing Unsupported, Alternately Place Feet on Step/Stool: Able to stand independently and complete 8  steps >20 seconds Standing Unsupported, One Foot in Front: Able to plae foot ahead of the other independently and hold 30 seconds Standing on One Leg: Able to lift leg independently and hold equal to or more than 3 seconds Total Score: 50 Static Sitting Balance Static Sitting - Balance Support:  Feet unsupported Static Sitting - Level of Assistance: 6: Modified independent (Device/Increase time) Dynamic Sitting Balance Dynamic Sitting - Balance Support: Feet supported Dynamic Sitting - Level of Assistance: 6: Modified independent (Device/Increase time) Static Standing Balance Static Standing - Balance Support: No upper extremity supported Static Standing - Level of Assistance: 6: Modified independent (Device/Increase time) Dynamic Standing Balance Dynamic Standing - Balance Support: During functional activity Dynamic Standing - Level of Assistance: 6: Modified independent (Device/Increase time);5: Stand by assistance Dynamic Standing - Comments: Standing to complete LB bathing/dressing Extremity Assessment  RUE Assessment RUE Assessment: Within Functional Limits LUE Assessment LUE Assessment: Within Functional Limits       See Function Navigator for Current Functional Status.  [name] 06/27/2017, 9:39 AM

## 2017-06-27 NOTE — Progress Notes (Signed)
Pt refusing to wear safety belt. Pt educated on safety plan and importance of having supervision while ambulating in her room. Pt left in wheelchair with call bell in place. Will monitor closely.

## 2017-06-27 NOTE — Progress Notes (Signed)
Occupational Therapy Discharge Summary  Patient Details  Name: [name] MRN: [id number] Date of Birth: Jan 01, 1936   Patient has met 93 of 10 long term goals due to improved activity tolerance, improved balance, postural control, ability to compensate for deficits, improved attention, improved awareness and improved coordination.  Patient to discharge at overall Supervision level.  Patient's care partner is independent to provide the necessary cognitive assistance at discharge.  Education completed with pt's sisters regarding pt's deficits, functional implications and recommendation for 24 hr assist at d/c. Pt is able to complete basic ADLs at mod I level, however, recommending 24 hr assist due to memory/langauge/cognitive deficits and decreased safety awareness. They voice willing and understanding to provide all needed assist at d/c.    Recommendation:  Patient will benefit from ongoing skilled OT services in home health setting to continue to advance functional skills in the area of BADL, iADL and Reduce care partner burden.  Equipment: No equipment provided  Reasons for discharge: treatment goals met and discharge from hospital  Patient/family agrees with progress made and goals achieved: Yes  OT Discharge Precautions/Restrictions  Precautions Precautions: Fall Precaution Comments: right inattention, right hemianopsia Restrictions Weight Bearing Restrictions: No Vision Baseline Vision/History: Wears glasses Wears Glasses: Reading only Patient Visual Report: Blurring of vision Vision Assessment?: Yes Eye Alignment: Within Functional Limits Ocular Range of Motion: Within Functional Limits Additional Comments: R inattention Perception  Perception: Impaired Inattention/Neglect: Does not attend to right visual field Praxis Praxis: Impaired Praxis Impairment Details: Motor planning Cognition Overall Cognitive Status: Impaired/Different from baseline Arousal/Alertness:  Awake/alert Orientation Level: Oriented X4 Attention: Sustained Sustained Attention: Impaired Sustained Attention Impairment: Verbal complex;Functional complex Memory: Impaired Memory Impairment: Retrieval deficit;Decreased short term memory;Decreased recall of new information Decreased Short Term Memory: Verbal basic;Functional basic Awareness: Impaired Awareness Impairment: Emergent impairment Problem Solving: Impaired Problem Solving Impairment: Verbal complex;Functional complex Executive Function: Reasoning;Decision Making Reasoning: Impaired Reasoning Impairment: Verbal basic;Functional basic Decision Making: Impaired Behaviors: Impulsive Safety/Judgment: Impaired Comments: Decreaed awareness of deficits Sensation Sensation Light Touch: Impaired Detail Light Touch Impaired Details: Impaired RUE Motor  Motor Motor: Within Functional Limits Trunk/Postural Assessment  Cervical Assessment Cervical Assessment: Within Functional Limits Thoracic Assessment Thoracic Assessment: Within Functional Limits Lumbar Assessment Lumbar Assessment: Within Functional Limits Postural Control Postural Control: Deficits on evaluation(Delayed righting reactions)  Balance Balance Balance Assessed: Yes Static Sitting Balance Static Sitting - Balance Support: Feet supported Static Sitting - Level of Assistance: 6: Modified independent (Device/Increase time) Dynamic Sitting Balance Dynamic Sitting - Balance Support: Feet supported;During functional activity Dynamic Sitting - Level of Assistance: 6: Modified independent (Device/Increase time) Static Standing Balance Static Standing - Balance Support: During functional activity Static Standing - Level of Assistance: 5: Stand by assistance Dynamic Standing Balance Dynamic Standing - Balance Support: During functional activity;Right upper extremity supported;Left upper extremity supported Dynamic Standing - Level of Assistance: 6: Modified  independent (Device/Increase time);5: Stand by assistance Dynamic Standing - Comments: Standing to complete LB bathing/dressing Extremity/Trunk Assessment RUE Assessment RUE Assessment: Within Functional Limits LUE Assessment LUE Assessment: Within Functional Limits   See Function Navigator for Current Functional Status.  [name] 06/27/2017, 7:18 AM

## 2017-06-27 NOTE — Discharge Summary (Signed)
Discharge summary job [id number]

## 2017-06-27 NOTE — Progress Notes (Signed)
Subjective/Complaints:  No abd pain , constipation improved, anxious to go home in am ROS:  Denies CP,SOB, N/V/D  Objective: Vital Signs: Blood pressure 140/74, pulse 65, temperature 98.9 F (37.2 C), temperature source Oral, resp. rate 16, height 5\' 3"  (1.6 m), SpO2 98 %. No results found. No results found for this or any previous visit (from the past 72 hour(s)).   HEENT: Normocephalic. Atraumatic. Cardio: RRR and no JVD. Resp: CTA B/L and unlaboured. GI: BS positive and ND Skin:   Intact. Warm and dry. Neuro: Alert/Oriented,  + Romberg falls to Left Motor: Grossly 5/5 proximal to distal (unchanged) Mnimal/no ataxia bilateral upper extremities Musc/Skel:  No edema. No tenderness. Gen NAD. Vital signs reviewed. Psych: Slightly hyperactive and anxious  Assessment/Plan: 1. Functional deficits secondary to Left P2 PCA infarct with aphasia ,apraxia, fine motor def which require 3+ hours per day of interdisciplinary therapy in a comprehensive inpatient rehab setting. Physiatrist is providing close team supervision and 24 hour management of active medical problems listed below. Physiatrist and rehab team continue to assess barriers to discharge/monitor patient progress toward functional and medical goals. FIM: Function - Bathing Position: Shower Body parts bathed by patient: Right arm, Left arm, Chest, Abdomen, Front perineal area, Buttocks, Right upper leg, Left upper leg, Right lower leg, Left lower leg, Back Body parts bathed by helper: Back, Left lower leg, Right lower leg Assist Level: Supervision or verbal cues Set up : To obtain items  Function- Upper Body Dressing/Undressing What is the patient wearing?: Pull over shirt/dress, Bra Bra - Perfomed by patient: Thread/unthread right bra strap, Thread/unthread left bra strap, Hook/unhook bra (pull down sports bra) Pull over shirt/dress - Perfomed by patient: Thread/unthread left sleeve, Put head through opening, Pull shirt  over trunk, Thread/unthread right sleeve Assist Level: More than reasonable time Set up : To obtain clothing/put away Function - Lower Body Dressing/Undressing What is the patient wearing?: Underwear, Socks, Non-skid slipper socks Position: Wheelchair/chair at sink Underwear - Performed by patient: Thread/unthread right underwear leg, Thread/unthread left underwear leg, Pull underwear up/down Pants- Performed by patient: Thread/unthread right pants leg, Pull pants up/down, Thread/unthread left pants leg Non-skid slipper socks- Performed by patient: Don/doff right sock, Don/doff left sock TED Hose - Performed by helper: Don/doff right TED hose, Don/doff left TED hose Assist for footwear: Supervision/touching assist Assist for lower body dressing: Supervision or verbal cues  Function - Toileting Toileting steps completed by patient: Adjust clothing prior to toileting, Performs perineal hygiene, Adjust clothing after toileting Toileting Assistive Devices: Grab bar or rail Assist level: Supervision or verbal cues  Function Programmer, multimedia transfer assistive device: Grab bar Assist level to toilet: Touching or steadying assistance (Pt > 75%) Assist level from toilet: Touching or steadying assistance (Pt > 75%)  Function - Chair/bed transfer Chair/bed transfer method: Stand pivot Chair/bed transfer assist level: Touching or steadying assistance (Pt > 75%) Chair/bed transfer assistive device: Bedrails Chair/bed transfer details: Verbal cues for sequencing, Verbal cues for precautions/safety  Function - Locomotion: Wheelchair Will patient use wheelchair at discharge?: Yes Type: Manual Max wheelchair distance: 118ft  Assist Level: Supervision or verbal cues Assist Level: Supervision or verbal cues Assist Level: Supervision or verbal cues Function - Locomotion: Ambulation Assistive device: No device Max distance: 429ft  Assist level: Supervision or verbal cues Assist level:  Supervision or verbal cues Assist level: Supervision or verbal cues Assist level: Supervision or verbal cues Assist level: Touching or steadying assistance (Pt > 75%)  Function - Comprehension Comprehension:  Auditory Comprehension assist level: Follows basic conversation/direction with no assist  Function - Expression Expression: Verbal Expression assist level: Expresses basic needs/ideas: With no assist  Function - Social Interaction Social Interaction assist level: Interacts appropriately with others with medication or extra time (anti-anxiety, antidepressant).  Function - Problem Solving Problem solving assist level: Solves basic 75 - 89% of the time/requires cueing 10 - 24% of the time, Solves basic 90% of the time/requires cueing < 10% of the time  Function - Memory Memory assist level: Recognizes or recalls 75 - 89% of the time/requires cueing 10 - 24% of the time, Recognizes or recalls 90% of the time/requires cueing < 10% of the time Patient normally able to recall (first 3 days only): Current season, Location of own room, Staff names and faces, That he or she is in a hospital  Medical Problem List and Plan: 1.Right side weaknesssecondary tolarge PCA infarction. Patient did receive TPA- EC ASA Continue CIR, PT, OT, plan D/C 1/16   Patient agreeable to stay until projected discharge date, she is more aware of her cognitive deficits. 2. DVT Prophylaxis/Anticoagulation: Subcutaneous Lovenox. Monitor for any bleeding episodes, PLT 179 on 1/9, will d/c today, amb longer distances 3. Pain Management:Tylenol as needed 4. Mood:Provide emotional support 5. Neuropsych: This patientis?fully capable of making decisions on herown behalf. 6. Skin/Wound Care:Routine skin checks 7. Fluids/Electrolytes/Nutrition:Routine I&O's, creat nl on 1/9 8.Hyperlipidemia. Lipitor 9. HTN: Vitals:   06/26/17 1510 06/27/17 0619  BP: 119/67 140/74  Pulse: 71 65  Resp: 18 16   Temp: 97.6 F (36.4 C) 98.9 F (37.2 C)  SpO2: 99% 98%     Controlled on 1/15  11.  Constipation , add stool softener LOS (Days) 7 A FACE TO FACE EVALUATION WAS PERFORMED  Erick Colacendrew E Kirsteins 06/27/2017, 7:15 AM

## 2017-06-27 NOTE — Progress Notes (Signed)
Occupational Therapy Session Note  Patient Details  Name: Angela SheererReva R Gilbert MRN: [id number] Date of Birth: 10/27/1935  Today's Date: 06/27/2017 OT Individual Time: [id number] and 1400-1430 OT Individual Time Calculation (min): 56 min and 30 min   Short Term Goals: Week 1:  OT Short Term Goal 1 (Week 1): STG=LTG due to LOS  Skilled Therapeutic Interventions/Progress Updates:    Session One: Pt seen for OT session focusing on ADL re-training and education. Pt sitting up in w/c upon arrival eating breakfast. Some confusion regarding time of day, pt stating she woke up at 8:00 this morning, though current time 7:30. She was unable to recall if she had written the time she woke up in her memory notebook or not. Referred to her notebook and she had written down she woke up at 6am this morning and also had gone to bathroom and got dressed independently. Extensive time and education provided regarding need for assist with all mobility due to physical and cognitive deficits and functional implications and safety caused by deficits.. Pt cont to have decreased safety awareness and decreased awareness of deficits, stated she "didn't want to bother the nurses" and "wanted to prove to herself and everyone else she could do it by herself".  With encouragement and education, pt agreeable to completing full bathing/dressing task again in order for therapist to assist and provide assist with increasing safety if needed. VCs to complete dynamic tasks such as doffing clothing from seated position vs standing and attempting to balance on one foot, vision occluded, etc. She bathed seated on tub bench, standing with use of grab bars to complete pericare/ buttock hygiene.  Following getting dressed, pt wrote down events of the morning in her memory notebook, min cuing and pt requiring max A for spelling of basic words. She ambulated throughout unit with supervision, unable to differentiate VCs of R/Gilbert. She was able to path find  back to room with min cuing, an improvement from previous sessions.  Pt left seated in chair at end of session, all needs in reach. She declined use of QRB, RN made aware.   Session Two: Pt seen for OT session focusing on family education. Pt sitting up in w/c upon arrival, hand off from SLP, and pt's sister's present. They will be the pt's primary caregivers. Extensive education provided regarding pt's deficits, physical and cognitive, and those functional implications. Provided examples and had pt demonstrate current level of mobility as well as balance deficits. Pt cont with decreased awareness and insight into deficits. Pt's sister's voiced understanding of recommendations for 24 hr supervision assist until next venue of care signs off.  Pt left sitting in w/c at end of session, all needs in reach and family present.  Therapy Documentation Precautions:  Precautions Precautions: Fall Precaution Comments: right inattention, right hemianopsia Restrictions Weight Bearing Restrictions: No Pain:   No/ denies pain  See Function Navigator for Current Functional Status.   Therapy/Group: Individual Therapy  [name] 06/27/2017, 7:00 AM

## 2017-06-28 MED ORDER — ASPIRIN 325 MG PO TBEC: 325.0000 mg | DELAYED_RELEASE_TABLET | Freq: Every day | ORAL | 0 refills | Status: DC

## 2017-06-28 MED ORDER — ATORVASTATIN CALCIUM 20 MG PO TABS: 20.0000 mg | ORAL_TABLET | Freq: Every day | ORAL | 0 refills | Status: DC

## 2017-06-28 NOTE — Discharge Instructions (Signed)
Inpatient Rehab Discharge Instructions  Angela SheererReva R Gilbert Discharge date and time: No discharge date for patient encounter.   Activities/Precautions/ Functional Status: Activity: activity as tolerated Diet: regular diet Wound Care: none needed Functional status:  ___ No restrictions     ___ Walk up steps independently ___ 24/7 supervision/assistance   ___ Walk up steps with assistance ___ Intermittent supervision/assistance  ___ Bathe/dress independently ___ Walk with walker     _x__ Bathe/dress with assistance ___ Walk Independently    ___ Shower independently ___ Walk with assistance    ___ Shower with assistance ___ No alcohol     ___ Return to work/school ________  COMMUNITY REFERRALS UPON DISCHARGE:   Outpatient: PT     OT     ST  Agency:  [facility]                             [address]                            [place], [place]  [id number]       Phone:    [phone]   Appointment Date/Time:  Monday, January 21st   10AM - Occupational Therapy;  11AM Physical Therapy - They will let you know when Speech Therapy appointment is on Monday. Medical Equipment/Items Ordered:  You did not need any medical equipment.   GENERAL COMMUNITY RESOURCES FOR PATIENT/FAMILY: Support Groups:  [place] [facility]                              Meets the 4th Tuesday of the month from 12:15 - 1:30 PM                              [facility]                              [address], [place]                              For information or to register, call [phone]  Special Instructions:  No driving STROKE/TIA DISCHARGE INSTRUCTIONS SMOKING Cigarette smoking nearly doubles your risk of having a stroke & is the single most alterable risk factor  If you smoke or have smoked in the last 12 months, you are advised to quit smoking for your health.  Most of the excess cardiovascular risk related to  smoking disappears within a year of stopping.  Ask you doctor about anti-smoking medications  [place] Quit Line: 1-800-QUIT NOW  Free Smoking Cessation Classes [phone]  CHOLESTEROL Know your levels; limit fat & cholesterol in your diet  Lipid Panel     Component Value Date/Time   CHOL 213 (H) 06/16/2017 0440   TRIG 65 06/16/2017 0440   HDL 80 06/16/2017 0440   CHOLHDL 2.7 06/16/2017 0440   VLDL 13 06/16/2017 0440   LDLCALC 120 (H) 06/16/2017 0440      Many patients benefit from treatment even if their cholesterol is at goal.  Goal: Total Cholesterol (CHOL) less than 160  Goal:  Triglycerides (TRIG) less than 150  Goal:  HDL greater than 40  Goal:  LDL (LDLCALC) less than 100   BLOOD PRESSURE American Stroke Association blood pressure target is less that 120/80 mm/Hg  Your discharge blood pressure is:  BP: 129/74  Monitor your blood pressure  Limit your salt and alcohol intake  Many individuals will require more than one medication for high blood pressure  DIABETES (A1c is a blood sugar average for last 3 months) Goal HGBA1c is under 7% (HBGA1c is blood sugar average for last 3 months)  Diabetes: No known diagnosis of diabetes    Lab Results  Component Value Date   HGBA1C 5.2 06/16/2017     Your HGBA1c can be lowered with medications, healthy diet, and exercise.  Check your blood sugar as directed by your physician  Call your physician if you experience unexplained or low blood sugars.  PHYSICAL ACTIVITY/REHABILITATION Goal is 30 minutes at least 4 days per week  Activity: Increase activity slowly, Therapies: Physical Therapy: Home Health Return to work:   Activity decreases your risk of heart attack and stroke and makes your heart stronger.  It helps control your weight and blood pressure; helps you relax and can improve your mood.  Participate in a regular exercise program.  Talk with your doctor about the best form of exercise for you (dancing, walking,  swimming, cycling).  DIET/WEIGHT Goal is to maintain a healthy weight  Your discharge diet is: Diet regular Room service appropriate? Yes; Fluid consistency: Thin  liquids Your height is:  Height: 5\' 3"  (160 cm) Your current weight is:   Your Body Mass Index (BMI) is:     Following the type of diet specifically designed for you will help prevent another stroke.  Your goal weight range is:    Your goal Body Mass Index (BMI) is 19-24.  Healthy food habits can help reduce 3 risk factors for stroke:  High cholesterol, hypertension, and excess weight.  RESOURCES Stroke/Support Group:  Call [phone]   STROKE EDUCATION PROVIDED/REVIEWED AND GIVEN TO PATIENT Stroke warning signs and symptoms How to activate emergency medical system (call 911). Medications prescribed at discharge. Need for follow-up after discharge. Personal risk factors for stroke. Pneumonia vaccine given:  Flu vaccine given:  My questions have been answered, the writing is legible, and I understand these instructions.  I will adhere to these goals & educational materials that have been provided to me after my discharge from the hospital.     My questions have been answered and I understand these instructions. I will adhere to these goals and the provided educational materials after my discharge from the hospital.  Patient/Caregiver Signature _______________________________ Date __________  Clinician Signature _______________________________________ Date __________  Please bring this form and your medication list with you to all your follow-up doctor's appointments.

## 2017-06-28 NOTE — Progress Notes (Signed)
Restful and cooperative,,verbalize no complaints , anticipate discharge  home today, Loop recorder site  [place]eh mainsle, Call bell within reach

## 2017-06-28 NOTE — Progress Notes (Signed)
Pt discharged to home with daughter. Discharge instructions given and pt has all belongings.

## 2017-06-28 NOTE — Progress Notes (Signed)
Subjective/Complaints:  No abd pain , constipation improved, anxious to go home in am ROS:  Denies CP,SOB, N/V/D  Objective: Vital Signs: Blood pressure (!) 147/78, pulse 67, temperature (!) 87.6 F (30.9 C), temperature source Oral, resp. rate 18, height 5' 3"  (1.6 m), weight 61 kg (134 lb 7.7 oz), SpO2 100 %. No results found. Results for orders placed or performed during the hospital encounter of 06/20/17 (from the past 72 hour(s))  Creatinine, serum     Status: None   Collection Time: 06/27/17  7:37 AM  Result Value Ref Range   Creatinine, Ser 0.82 0.44 - 1.00 mg/dL   GFR calc non Af Amer >60 >60 mL/min   GFR calc Af Amer >60 >60 mL/min    Comment: (NOTE) The eGFR has been calculated using the CKD EPI equation. This calculation has not been validated in all clinical situations. eGFR's persistently <60 mL/min signify possible Chronic Kidney Disease.       Gen NAD. Vital signs reviewed. Psych: Slightly hyperactive and anxious  Assessment/Plan: 1. Functional deficits secondary to Left P2 PCA infarct with aphasia ,apraxia, fine motor def  Stable for D/C today F/u PCP in 3-4 weeks F/u PM&R 2 weeks See D/C summary See D/C instructions FIM: Function - Bathing Position: Shower Body parts bathed by patient: Right arm, Left arm, Chest, Abdomen, Front perineal area, Buttocks, Right upper leg, Left upper leg, Right lower leg, Left lower leg, Back Body parts bathed by helper: Back, Left lower leg, Right lower leg Assist Level: More than reasonable time Set up : To obtain items  Function- Upper Body Dressing/Undressing What is the patient wearing?: Pull over shirt/dress, Bra Bra - Perfomed by patient: Thread/unthread right bra strap, Thread/unthread left bra strap, Hook/unhook bra (pull down sports bra) Pull over shirt/dress - Perfomed by patient: Thread/unthread left sleeve, Put head through opening, Pull shirt over trunk, Thread/unthread right sleeve Assist Level: More than  reasonable time Set up : To obtain clothing/put away Function - Lower Body Dressing/Undressing What is the patient wearing?: Underwear, Ted Hose, Non-skid slipper socks Position: Wheelchair/chair at [employer] - Performed by patient: Thread/unthread right underwear leg, Thread/unthread left underwear leg, Pull underwear up/down Pants- Performed by patient: Thread/unthread right pants leg, Pull pants up/down, Thread/unthread left pants leg Non-skid slipper socks- Performed by patient: Don/doff right sock, Don/doff left sock TED Hose - Performed by patient: Don/doff right TED hose, Don/doff left TED hose TED Hose - Performed by helper: Don/doff right TED hose, Don/doff left TED hose Assist for footwear: Supervision/touching assist Assist for lower body dressing: More than reasonable time  Function - Toileting Toileting steps completed by patient: Adjust clothing prior to toileting, Performs perineal hygiene, Adjust clothing after toileting Toileting Assistive Devices: Grab bar or rail Assist level: More than reasonable time  Function - Air cabin crew transfer assistive device: Grab bar Assist level to toilet: No Help, no cues, assistive device, takes more than a reasonable amount of time Assist level from toilet: No Help, no cues, assistive device, takes more than a reasonable amount of time  Function - Chair/bed transfer Chair/bed transfer method: Stand pivot Chair/bed transfer assist level: No Help, no cues, assistive device, takes more than a reasonable amount of time Chair/bed transfer assistive device: Bedrails Chair/bed transfer details: Verbal cues for sequencing, Verbal cues for precautions/safety  Function - Locomotion: Wheelchair Will patient use wheelchair at discharge?: No Type: Manual Max wheelchair distance: 172f  Assist Level: Supervision or verbal cues Assist Level: Supervision or verbal cues  Assist Level: Supervision or verbal cues Function -  Locomotion: Ambulation Assistive device: No device Max distance: 520f Assist level: Supervision or verbal cues Assist level: No help, No cues, assistive device, takes more than a reasonable amount of time Assist level: Supervision or verbal cues Assist level: Supervision or verbal cues Assist level: Supervision or verbal cues  Function - Comprehension Comprehension: Auditory Comprehension assist level: Follows basic conversation/direction with no assist  Function - Expression Expression: Verbal Expression assist level: Expresses basic needs/ideas: With no assist  Function - Social Interaction Social Interaction assist level: Interacts appropriately with others with medication or extra time (anti-anxiety, antidepressant).  Function - Problem Solving Problem solving assist level: Solves basic 75 - 89% of the time/requires cueing 10 - 24% of the time, Solves basic 90% of the time/requires cueing < 10% of the time  Function - Memory Memory assist level: Recognizes or recalls 75 - 89% of the time/requires cueing 10 - 24% of the time, Recognizes or recalls 90% of the time/requires cueing < 10% of the time Patient normally able to recall (first 3 days only): Current season, Location of own room, Staff names and faces, That he or she is in a hospital  Medical Problem List and Plan: 1.Right side weaknesssecondary tolarge PCA infarction. Patient did receive TPA- EC ASA Continue CIR, PT, OT, plan D/C today   Patient agreeable to stay until projected discharge date, she is more aware of her cognitive deficits. 2. DVT Prophylaxis/Anticoagulation:D/C Subcutaneous Lovenox. PLT 179 on 1/9, will d/c today, 3. Pain Management:Tylenol as needed 4. Mood:Provide emotional support 5. Neuropsych: This patientis?fully capable of making decisions on herown behalf. 6. Skin/Wound Care:Routine skin checks 7. Fluids/Electrolytes/Nutrition:Routine I&O's, creat nl on  1/9 8.Hyperlipidemia. Lipitor 9. HTN: Vitals:   06/27/17 1506 06/28/17 0553  BP: 131/76 (!) 147/78  Pulse: 72 67  Resp: 16 18  Temp: 98.2 F (36.8 C) (!) 87.6 F (30.9 C)  SpO2: 96% 100%     Controlled on 1/16  LOS (Days) 8 A FACE TO FACE EVALUATION WAS PERFORMED  ACharlett Blake1/16/2019, 9:19 AM

## 2017-06-28 NOTE — Progress Notes (Signed)
Social Work Discharge Note  The overall goal for the admission was met for:   Discharge location: Yes - home with family present 24/7  Length of Stay: Yes - 8 days  Discharge activity level: Yes - supervision  Home/community participation: Yes  Services provided included: MD, RD, PT, OT, SLP, RN, TR, Pharmacy, Neuropsych and SW  Financial Services: Medicare and Private Insurance: [place] secondary  Follow-up services arranged: Outpatient: PT/OT/ST at [facility] and Patient/Family has no preference for HH/DME agencies  No DME recommended.  Comments (or additional information): Pt to return to her home with family taking turns being with pt.  CSW gave dtr private duty agency information, as well.  Pt is not accepting/understanding of need for 24/7 supervision, as her awareness of her deficits is poor.  CSW established pt with a PCP.  [place] [facility] information given to pt, as well.  Patient/Family verbalized understanding of follow-up arrangements: Yes  Individual responsible for coordination of the follow-up plan: pt's dtr, sisters, extended family  Confirmed correct DME delivered: [place], [place] 06/28/2017    Moorea Boissonneault, Silvestre Mesi

## 2017-06-29 ENCOUNTER — Telehealth: Payer: Self-pay

## 2017-06-29 NOTE — Telephone Encounter (Signed)
Transitional Care call  Patient name: [name]([name]) DOB: (January 16, 1936) 1. Are you/is patient experiencing any problems since coming home? (No) a. Are there any questions regarding any aspect of care? (No) 2. Are there any questions regarding medications administration/dosing? (No) a. Are meds being taken as prescribed? (Yes) b. "Patient should review meds with caller to confirm"  3. Have there been any falls? (No) 4. Has Home Health been to the house and/or have they contacted you? (Yes) a. If not, have you tried to contact them? (No) b. Can we help you contact them? (No) 5. Are bowels and bladder emptying properly? (Yes) a. Are there any unexpected incontinence issues? (No) b. If applicable, is patient following bowel/bladder programs? (N/A) 6. Any fevers, problems with breathing, unexpected pain? (No) 7. Are there any skin problems or new areas of breakdown? (No) 8. Has the patient/family member arranged specialty MD follow up (ie cardiology/neurology/renal/surgical/etc.)?  (Yes) a. Can we help arrange? (No) 9. Does the patient need any other services or support that we can help arrange? (No) 10. Are caregivers following through as expected in assisting the patient? (Yes) 11. Has the patient quit smoking, drinking alcohol, or using drugs as recommended? (Never smoked)  Appointment date/time (07/07/17 @ 2:00pm), arrive time (1:30pm) and who it is with here Chief of Staff(Kirsteins) [address]

## 2017-07-03 ENCOUNTER — Encounter: Payer: Self-pay | Admitting: Physical Therapy

## 2017-07-03 ENCOUNTER — Ambulatory Visit: Payer: Medicare Other | Admitting: Physical Therapy

## 2017-07-03 ENCOUNTER — Other Ambulatory Visit: Payer: Self-pay

## 2017-07-03 ENCOUNTER — Ambulatory Visit: Payer: Medicare Other | Attending: Physician Assistant | Admitting: Occupational Therapy

## 2017-07-03 ENCOUNTER — Encounter: Payer: Self-pay | Admitting: Occupational Therapy

## 2017-07-03 DIAGNOSIS — R278 Other lack of coordination: Secondary | ICD-10-CM | POA: Diagnosis present

## 2017-07-03 DIAGNOSIS — H547 Unspecified visual loss: Secondary | ICD-10-CM

## 2017-07-03 DIAGNOSIS — R262 Difficulty in walking, not elsewhere classified: Secondary | ICD-10-CM | POA: Insufficient documentation

## 2017-07-03 DIAGNOSIS — M6281 Muscle weakness (generalized): Secondary | ICD-10-CM

## 2017-07-03 DIAGNOSIS — I6329 Cerebral infarction due to unspecified occlusion or stenosis of other precerebral arteries: Secondary | ICD-10-CM | POA: Diagnosis present

## 2017-07-03 NOTE — Therapy (Signed)
[facility] [address] [place], [place], [address] Phone: [phone]   Fax:  [phone]  Physical Therapy Evaluation  Patient Details  Name: Angela SheererReva R Gilbert MRN: [id number] Date of Birth: 01/24/1936 Referring Provider: Charlton AmorANGIULLI, DANIEL J   Encounter Date: 07/03/2017  PT End of Session - 07/03/17 1057    Visit Number  1    Number of Visits  25    Date for PT Re-Evaluation  09/25/17    PT Start Time  1100    PT Stop Time  1145    PT Time Calculation (min)  45 min       Past Medical History:  Diagnosis Date  . Stroke [facility](HCC)     Past Surgical History:  Procedure Laterality Date  . LOOP RECORDER INSERTION N/A 06/20/2017   Procedure: LOOP RECORDER INSERTION;  Surgeon: Hillis RangeAllred, James, MD;  [facility] INVAS[facility] CV LAB;  Service: Cardiovascular;  Laterality: N/A;  . TEE WITHOUT CARDIOVERSION N/A 06/19/2017   Procedure: TRANSESOPHAGEAL ECHOCARDIOGRAM (TEE) WITH LOOP;  Surgeon: Jake BatheSkains, Mark C, MD;  Location: [facility] ENDOSCOPY;  Service: Cardiovascular;  Laterality: N/A;    There were no vitals filed for this visit.   Subjective Assessment - 07/03/17 1102    Subjective  Patient reports that she cant see, she cant focus correctly. This is new following the CVA. Patient feels like she is slower, and is unsteday.     Pertinent History  Patient was to [place] Jan 3 for CVA, then she went to in patient rehab for a week and came home Jan 16th. She was discharged from inpatient rehab without AD.     Limitations  Standing;Walking    How long can you stand comfortably?  she can stand as long as she wants to.    How long can you walk comfortably?  She walks as far as she wants to walk    Patient Stated Goals  Patients goal is to be able to stay alone at home and not have her daughters live with her.     Currently in Pain?  No/denies    Pain Score  0-No pain         [facility] PT Assessment - 07/03/17 1110      Assessment   Medical  Diagnosis  CVA    Referring Provider  Mariam DollarANGIULLI, DANIEL J    Onset Date/Surgical Date  06/15/17    Hand Dominance  Right    Next MD Visit  07/07/17    Prior Therapy  in patient      Precautions   Precautions  Fall      Balance Screen   Has the patient fallen in the past 6 months  Yes    How many times?  1    Has the patient had a decrease in activity level because of a fear of falling?   Yes    Is the patient reluctant to leave their home because of a fear of falling?   No      Home Environment   Living Environment  Private residence    Living Arrangements  Alone    Available Help at Discharge  Family    Type of Home  House    Home Access  Stairs to enter    Entrance Stairs-Number of Steps  4    Entrance Stairs-Rails  Right    Home Layout  Two level    Alternate Level Stairs-Number of Steps  12  Alternate Level Stairs-Rails  Right    Home Equipment  None;Shower seat      Prior Function   Level of Independence  Independent    Vocation  Retired    Leisure  work on yard, plays in senior games table teninis, Programmer, applications, bocce ball, and corn hole      Cognition   Overall Cognitive Status  Within Functional Limits for tasks assessed    Attention  Focused         POSTURE: posterior lean in static standing   PROM/AROM: WNL BLE  STRENGTH:  Graded on a 0-5 scale Muscle Group Left Right                          Hip Flex 5/5 5/5  Hip Abd 5/5 5/5  Hip Add 5/5 5/5  Hip Ext 5/5 5/5  Hip IR/ER 5/5 5/5  Knee Flex 5/5 5/5  Knee Ext 5/5 5/5  Ankle DF 4/5 4/5  Ankle PF 4/5 4/5   SENSATION:  RUE and RLE is intermittent tingling  FUNCTIONAL MOBILITY: indepenedent with transfers with UE support and initial static standing balance is poor control with posterior leaning. Bed mobility is independent  BALANCE: Static Standing Balance  Normal Able to maintain standing balance against maximal resistance   Good Able to maintain standing balance against moderate resistance x   Good-/Fair+ Able to maintain standing balance against minimal resistance   Fair Able to stand unsupported without UE support and without LOB for 1-2 min   Fair- Requires Min A and UE support to maintain standing without loss of balance   Poor+ Requires mod A and UE support to maintain standing without loss of balance   Poor Requires max A and UE support to maintain standing balance without loss    Standing Dynamic Balance  Normal Stand independently unsupported, able to weight shift and cross midline maximally   Good Stand independently unsupported, able to weight shift and cross midline moderately   Good-/Fair+ Stand independently unsupported, able to weight shift across midline minimally x  Fair Stand independently unsupported, weight shift, and reach ipsilaterally, loss of balance when crossing midline   Poor+ Able to stand with Min A and reach ipsilaterally, unable to weight shift   Poor Able to stand with Mod A and minimally reach ipsilaterally, unable to cross midline.     GAIT: Gait with path deviation   OUTCOME MEASURES: TEST Outcome Interpretation  5 times sit<>stand 16.51 sec >60 yo, >15 sec indicates increased risk for falls  10 meter walk test    .78              m/s <1.0 m/s indicates increased risk for falls; limited community ambulator  Timed up and Go  13.19               sec <14 sec indicates increased risk for falls  6 minute walk test      1085          Feet 1000 feet is community ambulator                   Objective measurements completed on examination: See above findings.              PT Education - 07/03/17 1057    Education provided  Yes    Education Details  POC    Person(s) Educated  Patient    Methods  Explanation  Comprehension  Verbalized understanding       PT Short Term Goals - 07/03/17 1222      PT SHORT TERM GOAL #1   Title  Patient will be independent in home exercise program to improve strength/mobility for better  functional independence with ADLs.    Time  4    Period  Weeks    Status  New    Target Date  07/31/17      PT SHORT TERM GOAL #2   Title  Patient (> 11 years old) will complete five times sit to stand test in < 15 seconds indicating an increased LE strength and improved balance.    Time  4    Period  Weeks    Status  New    Target Date  07/31/17        PT Long Term Goals - 07/03/17 1329      PT LONG TERM GOAL #1   Title  Patient will increase 10 meter walk test to >1.58m/s as to improve gait speed for better community ambulation and to reduce fall risk.    Time  12    Period  Weeks    Status  New    Target Date  09/25/17      PT LONG TERM GOAL #2   Title  Patient will be independent with ascend/descend 12 steps using single UE in step over step pattern without LOB.    Time  12    Period  Weeks    Status  New    Target Date  09/25/17      PT LONG TERM GOAL #3   Title  Patient will be require no assist with ascend/descend 3 steps using Least restrictive assistive device.    Time  12    Period  Weeks    Status  New    Target Date  09/25/17             Plan - 07/03/17 1210    Clinical Impression Statement Patinet is 82 yr old female with recent CVA. She presents with unsteady gait during turns, head turns, and has deviated path with faster giat speed. She has decreased strength BLE ankles, decreased initial static standing, and decreased dynamic standing balance. She has decreased outcome meausres indicating a falls risk with 6 MW test, 5 x sit to stand and TUG. She will benefit from skilled PT to imrpove balance and decrease her falls risk and return to prior level of living.    Clinical Presentation  Stable    Clinical Decision Making  Low    Rehab Potential  Good    PT Frequency  2x / week    PT Duration  12 weeks    PT Treatment/Interventions  Gait training;Therapeutic exercise;Therapeutic activities;Functional mobility training;Stair training;Balance  training;Neuromuscular re-education;Patient/family education       Patient will benefit from skilled therapeutic intervention in order to improve the following deficits and impairments:  Abnormal gait, Decreased balance, Decreased endurance, Decreased mobility, Difficulty walking, Impaired sensation, Decreased strength, Decreased safety awareness, Decreased coordination, Decreased activity tolerance  Visit Diagnosis: Cerebrovascular accident (CVA) due to occlusion of left posterior communicating artery (HCC)  Difficulty in walking, not elsewhere classified     Problem List Patient Active Problem List   Diagnosis Date Noted  . Cognitive deficit, post-stroke   . Hyperglycemia   . Hypertension   . Neurologic gait disorder   . Cerebrovascular accident (CVA) due to occlusion of left posterior communicating artery (  HCC) 06/20/2017  . Stroke (cerebrum) (HCC) 06/15/2017    Ezekiel Ina, PT DPT 07/03/2017, 1:33 PM  [facility] MAIN [facility] SERVICES [address] [place], [place], [address] Phone: [phone]   Fax:  [phone]  Name: [name] MRN: [id number] Date of Birth: August 03, 1935

## 2017-07-03 NOTE — Therapy (Signed)
[facility] [address] [place], [place], [address] Phone: [phone]   Fax:  [phone]  Occupational Therapy Evaluation  Patient Details  Name: [name] MRN: [id number] Date of Birth: 08/03/1935 Referring Provider: Charlton AmorANGIULLI, DANIEL J   Encounter Date: 07/03/2017  OT End of Session - 07/03/17 1103    Visit Number  1    Number of Visits  24    Date for OT Re-Evaluation  09/25/17    OT Start Time  1000    OT Stop Time  1055    OT Time Calculation (min)  55 min    Activity Tolerance  Patient tolerated treatment well    Behavior During Therapy  [facility] for tasks assessed/performed       Past Medical History:  Diagnosis Date  . Stroke [facility](HCC)     Past Surgical History:  Procedure Laterality Date  . LOOP RECORDER INSERTION N/A 06/20/2017   Procedure: LOOP RECORDER INSERTION;  Surgeon: Hillis RangeAllred, James, MD;  [facility] INV[facility] LAB;  Service: Cardiovascular;  Laterality: N/A;  . TEE WITHOUT CARDIOVERSION N/A 06/19/2017   Procedure: TRANSESOPHAGEAL ECHOCARDIOGRAM (TEE) WITH LOOP;  Surgeon: Jake BatheSkains, Mark C, MD;  Location: [facility] ENDOSCOPY;  Service: Cardiovascular;  Laterality: N/A;    There were no vitals filed for this visit.  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    Patient is accompained by:  Family member    Pertinent History  Pt. is a 82 y.o. 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 Pt. received TPA. Pt. presented to the hospital with dizziness, right sided weakness, slurred speech, and blurred vision. Pt. PMHx includes: Hyperlipidemia, HTN, GERD, and Constipation.     Currently in Pain?  No/denies        [facility] Assessment - 07/03/17 1008      Assessment   Medical Diagnosis  CVA    Referring Provider  Dr. Wynn Bankerkirsteins    Onset Date/Surgical Date  06/15/17    Hand Dominance  Right    Next MD Visit  -- Fri. at 2pm      Balance Screen   Has the patient fallen in the past 6 months  Yes    How many times?  1    Has the patient had a decrease in activity level because of a fear of falling?   No    Is the patient reluctant to leave their home because of a fear of falling?   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    Meal Prep  Able to complete simple cold meal and snack prep    Medication Management  Is responsible for taking medication in correct dosages at correct time    Prior Level of Function Financial Management  -- Requires assist      Written Expression   Dominant Hand  Right    Handwriting  50% legible      Vision - History   Baseline Vision  Wears glasses only for reading Cataracts surgery years ago    Patient Visual Report  Peripheral vision impairment Right visual field deficit      Vision Assessment   Visual Fields  Right visual field  deficit      Activity Tolerance   Activity Tolerance  Tolerates 10-20 min activity with multiple rests      Cognition   Overall Cognitive Status  Impaired/Different from baseline    Memory  Impaired    Memory Impairment  Decreased short term memory    Awareness  Impaired    Executive Function  Decision Making    Decision Making  Impaired      Sensation   Light Touch  Appears Intact    Proprioception  Appears Intact      Coordination   Right 9 Hole Peg Test  28    Left 9 Hole Peg Test  26      Strength   Overall Strength Comments  RUE: 4+/5 overall, LUE: 5/5 overall       Hand Function   Right Hand Grip (lbs)  62    Right Hand Lateral Pinch  11 lbs    Right Hand 3 Point Pinch  13 lbs    Left Hand Grip (lbs)  53    Left Hand Lateral Pinch  13 lbs    Left 3 point pinch  11 lbs                           OT Long Term Goals - 07/03/17 1206      OT LONG TERM GOAL #1   Title  Pt. will independently demonstrate visual scanning and visual search strategies to be able to navigate within her environment during ADL, and IADL tasks.    Baseline  Pt. has difficulty    Time  12    Period  Weeks    Status  New    Target Date  09/25/17      OT LONG TERM GOAL #2   Title  Pt. will independently demonstrate visual scanning, and visual search strategies to be able to complete paperwork, and forms.    Baseline  Pt. has difficulty    Time  12    Period  Weeks    Status  New    Target Date  09/25/17      OT LONG TERM GOAL #3   Title  Pt. will indepndently demonstrate visual compensatory strategies during ADLs, and  IADLs.    Baseline  Pt. has difficulty    Time  12    Period  Weeks    Status  New    Target Date  09/25/17      OT LONG TERM GOAL #4   Title  Pt. will independently demonstrate cognitive compensatory strategies during ADLs, and IADLs.    Baseline  Pt. has difficulty    Time  12    Period  Weeks    Status  New    Target Date  09/25/17      OT  LONG TERM GOAL #5   Title  Pt. will increase RUE strength to improve engagement in ADLs, and IADLs.    Baseline  decreased RUE strength    Time  12    Period  Weeks    Status  New    Target Date  09/25/17      OT LONG TERM GOAL #6   Title  Pt. will increase right lateral pinch strength by 2# to assist with ADLs.    Baseline  Decreased strength    Time  12    Period  Weeks    Status  New    Target Date  09/25/17            Plan - 07/03/17 1156    Clinical Impression Statement  Pt. presents with right field impairments, Cognitive impairments, decreased safety awareness, and judgement, as well as memory changes which limit her ability to complete ADL, and IADL functioning. Pt. will benefit from OT services for pt. education about visual compensatory strategies, and cognitive compensatory strategies during ADLs, and IADLs, and improve UE strength for improved engagement in ADLs, and IADLs safely.    Occupational performance deficits (Please refer to evaluation for details):  ADL's;IADL's    OT Frequency  2x / week    OT Duration  12 weeks    OT Treatment/Interventions  Self-care/ADL training;Therapeutic exercise;Patient/family education;Cognitive remediation/compensation;Therapeutic activities;Visual/perceptual remediation/compensation    Plan  Assess visual to determine how it may be affecting ADL, and IADL functioning.    Clinical Decision Making  Several treatment options, min-mod task modification necessary    Consulted and Agree with Plan of Care  Patient       Patient will benefit from skilled therapeutic intervention in order to improve the following deficits and impairments:  Decreased strength, Impaired UE functional use, Impaired vision/preception, Decreased cognition, Decreased knowledge of precautions, Decreased safety awareness, Decreased activity tolerance, Decreased coordination  Visit Diagnosis: Muscle weakness (generalized)  Other lack of coordination  Vision  impairment    Problem List Patient Active Problem List   Diagnosis Date Noted  . Cognitive deficit, post-stroke   . Hyperglycemia   . Hypertension   . Neurologic gait disorder   . Cerebrovascular accident (CVA) due to occlusion of left posterior communicating artery (HCC) 06/20/2017  . Stroke (cerebrum) (HCC) 06/15/2017    Olegario Messier, MS, OTR/L 07/03/2017, 12:23 PM  [facility] MAIN [facility] SERVICES [address] [place], [place], [address] Phone: [phone]   Fax:  [phone]  Name: [name] MRN: [id number] Date of Birth: 07-28-1935

## 2017-07-04 ENCOUNTER — Other Ambulatory Visit: Payer: Self-pay | Admitting: *Deleted

## 2017-07-04 NOTE — Patient Outreach (Signed)
[facility] Care Management  07/04/2017  Angela SheererReva R Gilbert 10/22/1935 [id number]  Referral via EMMI-Stroke-Red Alert: Day #3 on 07/01/2017: Reason: Feeling worse-"yes"  Telephone call to patient; left HIPPA compliant message requesting call back.  Plan: Will follow up.  Colleen CanLinda Aqua Denslow, RN BSN CCM Care Management Coordinator [facility]  [phone]

## 2017-07-05 ENCOUNTER — Ambulatory Visit: Payer: Medicare Other | Admitting: Physical Therapy

## 2017-07-05 ENCOUNTER — Ambulatory Visit (INDEPENDENT_AMBULATORY_CARE_PROVIDER_SITE_OTHER): Payer: Self-pay | Admitting: *Deleted

## 2017-07-05 ENCOUNTER — Encounter: Payer: Self-pay | Admitting: Physical Therapy

## 2017-07-05 ENCOUNTER — Other Ambulatory Visit: Payer: Self-pay | Admitting: *Deleted

## 2017-07-05 DIAGNOSIS — M6281 Muscle weakness (generalized): Secondary | ICD-10-CM | POA: Diagnosis not present

## 2017-07-05 DIAGNOSIS — R262 Difficulty in walking, not elsewhere classified: Secondary | ICD-10-CM

## 2017-07-05 DIAGNOSIS — I6329 Cerebral infarction due to unspecified occlusion or stenosis of other precerebral arteries: Secondary | ICD-10-CM

## 2017-07-05 DIAGNOSIS — R278 Other lack of coordination: Secondary | ICD-10-CM

## 2017-07-05 LAB — CUP PACEART INCLINIC DEVICE CHECK
Implantable Pulse Generator Implant Date: 20190108
MDC IDC SESS DTM: 20190123112046

## 2017-07-05 NOTE — Patient Instructions (Signed)
Tandem Stance    Stand in a corner very close to the walls. Have a chair turned away from you with back close to you for support. Right foot in front of left, heel touching toe both feet "straight ahead". Stand on Foot Triangle of Support with both feet. Balance in this position 30___ seconds. Do with left foot in front of right.  Copyright  VHI. All rights reserved.  Heel Raises    Stand with support. Tighten pelvic floor and hold. With knees straight, raise heels off ground. Hold __3_ seconds. Relax for __3_ seconds. Repeat __20_ times. Do 2___ times a day.  Copyright  VHI. All rights reserved.

## 2017-07-05 NOTE — Progress Notes (Signed)
Wound check appointment. Steri-strips removed. Wound without redness or edema. Incision edges approximated, wound well healed. Normal device function. Battery status: good. R-waves 0.7570mV. No symptom, tachy, or AF episodes. Pause and brady detection remain off since implant. Patient educated about wound care and Carelink monitor. Monthly summary reports and ROV with JA PRN.

## 2017-07-05 NOTE — Therapy (Signed)
[place] [facility] MAIN [facility] SERVICES [address] [place], [place], [address] Phone: [phone]   Fax:  [phone]  Physical Therapy Treatment  Patient Details  Name: [name] MRN: [id number] Date of Birth: 16-May-1936 Referring Provider: Charlton Amor   Encounter Date: 07/05/2017  PT End of Session - 07/05/17 1302    Visit Number  2    Number of Visits  25    Date for PT Re-Evaluation  09/25/17    PT Start Time  0100    PT Stop Time  0138    PT Time Calculation (min)  38 min       Past Medical History:  Diagnosis Date  . Stroke [facility])     Past Surgical History:  Procedure Laterality Date  . LOOP RECORDER INSERTION N/A 06/20/2017   Procedure: LOOP RECORDER INSERTION;  Surgeon: [name], MD;  Location: [facility] INVASIVE CV LAB;  Service: Cardiovascular;  Laterality: N/A;  . TEE WITHOUT CARDIOVERSION N/A 06/19/2017   Procedure: TRANSESOPHAGEAL ECHOCARDIOGRAM (TEE) WITH LOOP;  Surgeon: Jake Bathe, MD;  Location: [facility] ENDOSCOPY;  Service: Cardiovascular;  Laterality: N/A;    There were no vitals filed for this visit.     NMR:  Arts administrator fwd / bwd with UE support x 40  Rocking board side to side with UE support x 40   Standing on purple foam with feet apart, feet together  Head turns left and right x 2 minutes each, cues for technique and posture correction  Standing on purple foam tandem with RLE in front, tandem with LLE in front,x 1 minute each, cues for technique and posture correction  Standing on purple foam with feet together sorting cones across midline cues for technique and posture correction  Standing on 1/2 foam  cues for technique and posture correction, nu UE support x 1 min x 2     Patient required tactile cueing and CGA during all dynamic standing balance activities. Patient required verbal and tactile cueing to maintain correct position during sidelying clamshell exercise.   Therapeutic exercise:    Matrix 12. 5 resisted walking fwd/ bwd/ side to side x 5, cues for posture correction  Standing heel raises x 20 , cues for posture correction  Leg press 120 lbs x 20 x 2 , cues to slow down                    PT Education - 07/05/17 1302    Education provided  Yes    Education Details  safety with uneven surfaces    Person(s) Educated  Patient    Methods  Explanation    Comprehension  Verbalized understanding       PT Short Term Goals - 07/03/17 1222      PT SHORT TERM GOAL #1   Title  Patient will be independent in home exercise program to improve strength/mobility for better functional independence with ADLs.    Time  4    Period  Weeks    Status  New    Target Date  07/31/17      PT SHORT TERM GOAL #2   Title  Patient (> 1 years old) will complete five times sit to stand test in < 15 seconds indicating an increased LE strength and improved balance.    Time  4    Period  Weeks    Status  New    Target Date  07/31/17  PT Long Term Goals - 07/03/17 1329      PT LONG TERM GOAL #1   Title  Patient will increase 10 meter walk test to >1.21107m/s as to improve gait speed for better community ambulation and to reduce fall risk.    Time  12    Period  Weeks    Status  New    Target Date  09/25/17      PT LONG TERM GOAL #2   Title  Patient will be independent with ascend/descend 12 steps using single UE in step over step pattern without LOB.    Time  12    Period  Weeks    Status  New    Target Date  09/25/17      PT LONG TERM GOAL #3   Title  Patient will be require no assist with ascend/descend 3 steps using Least restrictive assistive device.    Time  12    Period  Weeks    Status  New    Target Date  09/25/17            Plan - 07/05/17 1302    Clinical Impression Statement  Patient required min verbal cues to perform matrix fwd/ bwd/ side stepping with weights for posture and control and required verbal and tactile cues during all  dynamic standing balance activities. Patient demonstrated decreased gait speed with ambulation without AD.  Patient will continue to benefit from skilled therapy in order to improve strength, dynamic standing balance and increase gait speed to reduce risk for falls    Rehab Potential  Good    PT Frequency  2x / week    PT Duration  12 weeks    PT Treatment/Interventions  Gait training;Therapeutic exercise;Therapeutic activities;Functional mobility training;Stair training;Balance training;Neuromuscular re-education;Patient/family education       Patient will benefit from skilled therapeutic intervention in order to improve the following deficits and impairments:  Abnormal gait, Decreased balance, Decreased endurance, Decreased mobility, Difficulty walking, Impaired sensation, Decreased strength, Decreased safety awareness, Decreased coordination, Decreased activity tolerance  Visit Diagnosis: Cerebrovascular accident (CVA) due to occlusion of left posterior communicating artery (HCC)  Difficulty in walking, not elsewhere classified  Muscle weakness (generalized)  Other lack of coordination     Problem List Patient Active Problem List   Diagnosis Date Noted  . Cognitive deficit, post-stroke   . Hyperglycemia   . Hypertension   . Neurologic gait disorder   . Cerebrovascular accident (CVA) due to occlusion of left posterior communicating artery (HCC) 06/20/2017  . Stroke (cerebrum) (HCC) 06/15/2017    Ezekiel InaMansfield, Kreed Kauffman S, PT DPT 07/05/2017, 1:05 PM  [place] [facility] [address] [place], [place], [id number] Phone: [phone]   Fax:  [phone]  Name: Angela SheererReva R Gilbert MRN: [id number] Date of Birth: 09/13/1935

## 2017-07-05 NOTE — Patient Outreach (Addendum)
[facility] Care Management  07/05/2017  Angela SheererReva R Gilbert 05/10/1936 [id number]  Referral via EMMI-Stroke-Red Alert: Day #3 on 07/01/2017: Reason: Feeling worse-"yes"  Telephone call #2; patient answered call & was advised of reason for call. HIPPA verification received from patient.  Patient states she is feeling well not worse. States when she received automatic call she was unable to navigate to hit the right buttons. States she is currently going to outpatient facility for rehabilitation and just got home.  States she still has some visual impairment, sometimes has memory problem. States no new problems & knows when to call 911 for stroke symptoms. States taking medications as prescribed.  Thanked this Airline pilotN care coordinator for call.  Plan: Send to care management assistant to close case.   Angela CanLinda Melida Northington, RN BSN CCM Care Management Coordinator [facility] Care [facility]  [phone]

## 2017-07-07 ENCOUNTER — Ambulatory Visit: Payer: Medicare Other | Admitting: Occupational Therapy

## 2017-07-07 ENCOUNTER — Encounter: Payer: Self-pay | Admitting: Occupational Therapy

## 2017-07-07 ENCOUNTER — Encounter: Payer: Medicare Other | Admitting: Physical Medicine & Rehabilitation

## 2017-07-07 DIAGNOSIS — H547 Unspecified visual loss: Secondary | ICD-10-CM

## 2017-07-07 DIAGNOSIS — M6281 Muscle weakness (generalized): Secondary | ICD-10-CM | POA: Diagnosis not present

## 2017-07-07 NOTE — Therapy (Signed)
[facility] MAIN [facility] SERVICES [address] [place], [place], [address] Phone: [phone]   Fax:  [phone]  Occupational Therapy Treatment  Patient Details  Name: [name] MRN: [id number] Date of Birth: 09/02/1935 Referring Provider: Charlton Amor   Encounter Date: 07/07/2017  OT End of Session - 07/07/17 1203    Visit Number  2    Number of Visits  34    Date for OT Re-Evaluation  09/25/17    OT Start Time  1050    OT Stop Time  1135    OT Time Calculation (min)  45 min    Activity Tolerance  Patient tolerated treatment well    Behavior During Therapy  [facility] for tasks assessed/performed       Past Medical History:  Diagnosis Date  . Stroke [facility])     Past Surgical History:  Procedure Laterality Date  . LOOP RECORDER INSERTION N/A 06/20/2017   Procedure: LOOP RECORDER INSERTION;  Surgeon: [name], MD;  [facility] IN[facility] CV LAB;  Service: Cardiovascular;  Laterality: N/A;  . TEE WITHOUT CARDIOVERSION N/A 06/19/2017   Procedure: TRANSESOPHAGEAL ECHOCARDIOGRAM (TEE) WITH LOOP;  Surgeon: Jake Bathe, MD;  Location: [facility] ENDOSCOPY;  Service: Cardiovascular;  Laterality: N/A;    There were no vitals filed for this visit.   OT TREATMENT    Selfcare:   Basic visual function was assessed to determine how vision may be affecting daily ADL, and IADL tasks. Intermedicate distance acuity was assessed using the BiVABA Intermediate Distance Chart at a distance of 1 meter Snellen: Right eye: 20/60, left eye: 20/50, eyes  together: 20/40. Reading acuity using the BiVABA warren text card at distance of 16 inches. 20/320 with head still.  contrast sensitivity function to 2.5% using the BiVABA Lea numbers screener. Visual scanning single letter search-simple was assessed in 2 min.. Pt. Had 3 omissions. One in the right side of paper, one on the left, and one in the center. Using horizontal rectilinear visual search strategies. Pt.  utilized vertical rectilinear visual search strategies to  Assess visual search strategies in her extrapersonal space using a scanboard with no omissions, or misidentifications.  Horizontal and vertical visual fields were assessed indicating impairments to the right visual field which limits her ability to navigate her environment, and perform ADL/IADL tasks, and tabletop ADL/ IAADL tasks. Visual compensatory stratgies were reviewed with the pt. Pt. Presents with increased head, and body movements with leaning to read, and identify objects.                               OT Long Term Goals - 07/03/17 1206      OT LONG TERM GOAL #1   Title  Pt. will independently demonstrate visual scanning and visual search strategies to be able to navigate within her environment during ADL, and IADL tasks.    Baseline  Pt. has difficulty    Time  12    Period  Weeks    Status  New    Target Date  09/25/17      OT LONG TERM GOAL #2   Title  Pt. will independently demonstrate visual scanning, and visual search strategies to be able to complete paperwork, and forms.    Baseline  Pt. has difficulty    Time  12    Period  Weeks    Status  New    Target Date  09/25/17      OT LONG TERM GOAL #3   Title  Pt. will indepndently demonstrate visual compensatory strategies during ADLs, and IADLs.    Baseline  Pt. has difficulty    Time  12    Period  Weeks    Status  New    Target Date  09/25/17      OT LONG TERM GOAL #4   Title  Pt. will independently demonstrate cognitive compensatory strategies during ADLs, and IADLs.    Baseline  Pt. has difficulty    Time  12    Period  Weeks    Status  New    Target Date  09/25/17      OT LONG TERM GOAL #5   Title  Pt. will increase RUE strength to improve engagement in ADLs, and IADLs.    Baseline  decreased RUE strength    Time  12    Period  Weeks    Status  New    Target Date  09/25/17      OT LONG TERM GOAL #6   Title  Pt. will  increase right lateral pinch strength by 2# to assist with ADLs.    Baseline  Decreased strength    Time  12    Period  Weeks    Status  New    Target Date  09/25/17            Plan - 07/07/17 1208    Clinical Impression Statement  Pt. reports she is now able to feed her dogs, and let them out. Pt. reports she feels like her vision is improving. Pt. reports she is having difficulty reading. Basic visual function was assessed to determine how vision may be affecting daily ADL, and IADL tasks. Intermedicate distance acuity was assessed using the BiVABA Intermediate Distance Chart at a distance of 1 meter Snellen: Right eye: 20/60, left eye: 20/50, eyes  together: 20/40. Reading acuity using the BiVABA warren text card at distance of 16 inches. 20/320 with head still.  contrast sensitivity function to 2.5% using the BiVABA Lea numbers screener. Visual scanning single letter search-simple was assessed in 2 min.. Pt. Had 3 omissions. One in the right side of paper, one on the left, and one in the center. Using horizontal rectilinear visual search strategies. Pt. utilized vertical rectilinear visual search strategies to  Assess visual search strategies in her extrapersonal space using a scanboard with no omissions, or misidentifications.  Horizontal and vertical visual fields were assessed indicating impairments to the right visual field which limits her ability to navigate her environment, and perform ADL/IADL tasks, and tabletop ADL/ IAADL tasks.    Occupational performance deficits (Please refer to evaluation for details):  IADL's    OT Frequency  2x / week    Clinical Decision Making  Several treatment options, min-mod task modification necessary    Consulted and Agree with Plan of Care  Patient       Patient will benefit from skilled therapeutic intervention in order to improve the following deficits and impairments:  Decreased strength, Impaired UE functional use, Impaired vision/preception,  Decreased cognition, Decreased knowledge of precautions, Decreased safety awareness, Decreased activity tolerance, Decreased coordination  Visit Diagnosis: Muscle weakness (generalized)  Vision impairment    Problem List Patient Active Problem List   Diagnosis Date Noted  . Cognitive deficit, post-stroke   . Hyperglycemia   . Hypertension   . Neurologic gait disorder   . Cerebrovascular accident (CVA) due to  occlusion of left posterior communicating artery (HCC) 06/20/2017  . Stroke (cerebrum) (HCC) 06/15/2017    Olegario Messier, MS, OTR/L 07/07/2017, 12:24 PM  [place] [facility] MAIN [facility] SERVICES [address] [place], [place], [address] Phone: [phone]   Fax:  [phone]  Name: [name] MRN: [id number] Date of Birth: 1935-08-17

## 2017-07-10 ENCOUNTER — Ambulatory Visit: Payer: Medicare Other | Admitting: Physical Therapy

## 2017-07-10 ENCOUNTER — Encounter: Payer: Medicare Other | Admitting: Occupational Therapy

## 2017-07-11 ENCOUNTER — Inpatient Hospital Stay
Admission: EM | Admit: 2017-07-11 | Discharge: 2017-07-12 | DRG: 066 | Disposition: A | Discharge status: Home / Self Care | Payer: Medicare Other | Attending: Internal Medicine | Admitting: Internal Medicine

## 2017-07-11 ENCOUNTER — Encounter: Payer: Self-pay | Admitting: Occupational Therapy

## 2017-07-11 ENCOUNTER — Ambulatory Visit: Payer: Medicare Other

## 2017-07-11 ENCOUNTER — Other Ambulatory Visit: Payer: Self-pay

## 2017-07-11 ENCOUNTER — Emergency Department: Payer: Medicare Other

## 2017-07-11 ENCOUNTER — Ambulatory Visit: Payer: Medicare Other | Admitting: Occupational Therapy

## 2017-07-11 ENCOUNTER — Ambulatory Visit: Payer: Medicare Other | Admitting: Physical Therapy

## 2017-07-11 ENCOUNTER — Encounter: Payer: Self-pay | Admitting: Emergency Medicine

## 2017-07-11 DIAGNOSIS — E860 Dehydration: Secondary | ICD-10-CM | POA: Diagnosis not present

## 2017-07-11 DIAGNOSIS — E785 Hyperlipidemia, unspecified: Secondary | ICD-10-CM | POA: Diagnosis not present

## 2017-07-11 DIAGNOSIS — I619 Nontraumatic intracerebral hemorrhage, unspecified: Secondary | ICD-10-CM | POA: Diagnosis not present

## 2017-07-11 DIAGNOSIS — I10 Essential (primary) hypertension: Secondary | ICD-10-CM | POA: Diagnosis present

## 2017-07-11 DIAGNOSIS — R29701 NIHSS score 1: Secondary | ICD-10-CM | POA: Diagnosis not present

## 2017-07-11 DIAGNOSIS — I6329 Cerebral infarction due to unspecified occlusion or stenosis of other precerebral arteries: Secondary | ICD-10-CM

## 2017-07-11 DIAGNOSIS — Z8249 Family history of ischemic heart disease and other diseases of the circulatory system: Secondary | ICD-10-CM | POA: Diagnosis not present

## 2017-07-11 DIAGNOSIS — H547 Unspecified visual loss: Secondary | ICD-10-CM

## 2017-07-11 DIAGNOSIS — Z79899 Other long term (current) drug therapy: Secondary | ICD-10-CM

## 2017-07-11 DIAGNOSIS — I951 Orthostatic hypotension: Secondary | ICD-10-CM | POA: Diagnosis present

## 2017-07-11 DIAGNOSIS — I629 Nontraumatic intracranial hemorrhage, unspecified: Principal | ICD-10-CM | POA: Diagnosis present

## 2017-07-11 DIAGNOSIS — Z7982 Long term (current) use of aspirin: Secondary | ICD-10-CM

## 2017-07-11 DIAGNOSIS — R42 Dizziness and giddiness: Secondary | ICD-10-CM

## 2017-07-11 DIAGNOSIS — I69393 Ataxia following cerebral infarction: Secondary | ICD-10-CM | POA: Diagnosis not present

## 2017-07-11 DIAGNOSIS — M6281 Muscle weakness (generalized): Secondary | ICD-10-CM

## 2017-07-11 DIAGNOSIS — R4701 Aphasia: Secondary | ICD-10-CM | POA: Diagnosis present

## 2017-07-11 DIAGNOSIS — R278 Other lack of coordination: Secondary | ICD-10-CM

## 2017-07-11 DIAGNOSIS — Z66 Do not resuscitate: Secondary | ICD-10-CM | POA: Diagnosis not present

## 2017-07-11 DIAGNOSIS — R262 Difficulty in walking, not elsewhere classified: Secondary | ICD-10-CM

## 2017-07-11 DIAGNOSIS — H539 Unspecified visual disturbance: Secondary | ICD-10-CM | POA: Diagnosis present

## 2017-07-11 LAB — DIFFERENTIAL
Basophils Absolute: 0 10*3/uL (ref 0–0.1)
Basophils Relative: 0 %
EOS PCT: 1 %
Eosinophils Absolute: 0.1 10*3/uL (ref 0–0.7)
LYMPHS PCT: 17 %
Lymphs Abs: 0.9 10*3/uL — ABNORMAL LOW (ref 1.0–3.6)
MONO ABS: 0.4 10*3/uL (ref 0.2–0.9)
MONOS PCT: 8 %
NEUTROS ABS: 3.9 10*3/uL (ref 1.4–6.5)
Neutrophils Relative %: 74 %

## 2017-07-11 LAB — CBC
HEMATOCRIT: 39.6 % (ref 35.0–47.0)
Hemoglobin: 13.5 g/dL (ref 12.0–16.0)
MCH: 32.4 pg (ref 26.0–34.0)
MCHC: 34 g/dL (ref 32.0–36.0)
MCV: 95.2 fL (ref 80.0–100.0)
Platelets: 144 10*3/uL — ABNORMAL LOW (ref 150–440)
RBC: 4.16 MIL/uL (ref 3.80–5.20)
RDW: 13 % (ref 11.5–14.5)
WBC: 5.3 10*3/uL (ref 3.6–11.0)

## 2017-07-11 LAB — COMPREHENSIVE METABOLIC PANEL
ALT: 16 U/L (ref 14–54)
ANION GAP: 9 (ref 5–15)
AST: 26 U/L (ref 15–41)
Albumin: 4.5 g/dL (ref 3.5–5.0)
Alkaline Phosphatase: 60 U/L (ref 38–126)
BILIRUBIN TOTAL: 0.9 mg/dL (ref 0.3–1.2)
BUN: 18 mg/dL (ref 6–20)
CALCIUM: 9.4 mg/dL (ref 8.9–10.3)
CO2: 23 mmol/L (ref 22–32)
CREATININE: 0.84 mg/dL (ref 0.44–1.00)
Chloride: 108 mmol/L (ref 101–111)
Glucose, Bld: 107 mg/dL — ABNORMAL HIGH (ref 65–99)
Potassium: 3.9 mmol/L (ref 3.5–5.1)
Sodium: 140 mmol/L (ref 135–145)
TOTAL PROTEIN: 6.8 g/dL (ref 6.5–8.1)

## 2017-07-11 LAB — TROPONIN I: Troponin I: <0.03 ng/mL (ref <0.03)

## 2017-07-11 LAB — PROTIME-INR
INR: 0.95
PROTHROMBIN TIME: 12.6 s (ref 11.4–15.2)

## 2017-07-11 LAB — APTT: aPTT: 28 seconds (ref 24–36)

## 2017-07-11 MED ORDER — AMLODIPINE BESYLATE 10 MG PO TABS
10.0000 mg | ORAL_TABLET | Freq: Every day | ORAL | Status: DC
  Administered 2017-07-11: 18:00:00 10 mg via ORAL
  Filled 2017-07-11: qty 1

## 2017-07-11 MED ORDER — ATORVASTATIN CALCIUM 20 MG PO TABS
20.0000 mg | ORAL_TABLET | Freq: Every day | ORAL | Status: DC
  Administered 2017-07-11: 18:00:00 20 mg via ORAL
  Filled 2017-07-11: qty 1

## 2017-07-11 MED ORDER — DOCUSATE SODIUM 100 MG PO CAPS: 100.0000 mg | ORAL_CAPSULE | Freq: Two times a day (BID) | ORAL | Status: DC | PRN

## 2017-07-11 NOTE — H&P (Signed)
[employer] -  at [facility]   PATIENT NAME: [name]    MR#:  [id number]  DATE OF BIRTH:  26-Mar-1936  DATE OF ADMISSION:  07/11/2017  PRIMARY CARE PHYSICIAN: Leotis Shames, MD   REQUESTING/REFERRING PHYSICIAN: Chrissie Noa  CHIEF COMPLAINT:   Chief Complaint  Patient presents with  . Dizziness    HISTORY OF PRESENT ILLNESS: [name]  is a 82 y.o. female with a known history of stroke approximately 3 weeks ago on ASA at home presented to the ED today with complaints of worsening dizziness and ataxia.  The patient reports when she got up this morning attempted to try to walk she almost fell due to inability to hold her balance.  She got back in bed.  When she got up later she still felt like she was not at her baseline.  Patient presented for evaluation at that time.  Reports that she is currently back to baseline other than feeling that her thinking is not quite as clear and that she is more tired than usual. She is noted a small bleed on CT head per radiologist, but nurologist doubt it. Neurologist saw the pt, suggest to monitor her here and do frequent neuro checks and a repeat CT head tomorrow.  PAST MEDICAL HISTORY:   Past Medical History:  Diagnosis Date  . Stroke [facility])     PAST SURGICAL HISTORY:  Past Surgical History:  Procedure Laterality Date  . LOOP RECORDER INSERTION N/A 06/20/2017   Procedure: LOOP RECORDER INSERTION;  Surgeon: [name], MD;  [facility]: [facility] INVAS[facility] CV LAB;  Service: Cardiovascular;  Laterality: N/A;  . TEE WITHOUT CARDIOVERSION N/A 06/19/2017   Procedure: TRANSESOPHAGEAL ECHOCARDIOGRAM (TEE) WITH LOOP;  Surgeon: Jake Bathe, MD;  Location: [facility] ENDOSCOPY;  Service: Cardiovascular;  Laterality: N/A;    SOCIAL HISTORY:  Social History   Tobacco Use  . Smoking status: Never Smoker  . Smokeless tobacco: Never Used  . Tobacco comment: None  Substance Use Topics  . Alcohol use: No    Frequency: Never    Comment: None     FAMILY HISTORY:  Family History  Problem Relation Age of Onset  . Heart attack Father   . Breast cancer Sister   . Breast cancer Brother     DRUG ALLERGIES: No Known Allergies  REVIEW OF SYSTEMS:   CONSTITUTIONAL: No fever, fatigue or weakness.  EYES: No blurred or double vision.  EARS, NOSE, AND THROAT: No tinnitus or ear pain.  RESPIRATORY: No cough, shortness of breath, wheezing or hemoptysis.  CARDIOVASCULAR: No chest pain, orthopnea, edema.  GASTROINTESTINAL: No nausea, vomiting, diarrhea or abdominal pain.  GENITOURINARY: No dysuria, hematuria.  ENDOCRINE: No polyuria, nocturia,  HEMATOLOGY: No anemia, easy bruising or bleeding SKIN: No rash or lesion. MUSCULOSKELETAL: No joint pain or arthritis.   NEUROLOGIC: No tingling, right sided numbness, weakness.  PSYCHIATRY: No anxiety or depression.   MEDICATIONS AT HOME:  Prior to Admission medications   Medication Sig Start Date End Date Taking? Authorizing Provider  aspirin EC 325 MG EC tablet Take 1 tablet (325 mg total) by mouth daily. 06/28/17  Yes Angiulli, Mcarthur Rossetti, PA-C  atorvastatin (LIPITOR) 20 MG tablet Take 1 tablet (20 mg total) by mouth daily at 6 PM. 06/28/17  Yes Angiulli, Mcarthur Rossetti, PA-C      PHYSICAL EXAMINATION:   VITAL SIGNS: Blood pressure (!) 190/89, pulse 77, temperature 97.6 F (36.4 C), temperature source Oral, resp. rate 20, height 5\' 3"  (1.6 m), weight  60.8 kg (134 lb), SpO2 100 %.  GENERAL:  82 y.o.-year-old patient lying in the bed with no acute distress.  EYES: Pupils equal, round, reactive to light and accommodation. No scleral icterus. Extraocular muscles intact.  HEENT: Head atraumatic, normocephalic. Oropharynx and nasopharynx clear.  NECK:  Supple, no jugular venous distention. No thyroid enlargement, no tenderness.  LUNGS: Normal breath sounds bilaterally, no wheezing, rales,rhonchi or crepitation. No use of accessory muscles of respiration.  CARDIOVASCULAR: S1, S2 normal. No murmurs,  rubs, or gallops.  ABDOMEN: Soft, nontender, nondistended. Bowel sounds present. No organomegaly or mass.  EXTREMITIES: No pedal edema, cyanosis, or clubbing.  NEUROLOGIC: Cranial nerves II through XII are intact. Muscle strength 5/5 in all extremities. Sensation intact. Gait not checked.  PSYCHIATRIC: The patient is alert and oriented x 3.  SKIN: No obvious rash, lesion, or ulcer.   LABORATORY PANEL:   CBC Recent Labs  Lab 07/11/17 0939  WBC 5.3  HGB 13.5  HCT 39.6  PLT 144*  MCV 95.2  MCH 32.4  MCHC 34.0  RDW 13.0  LYMPHSABS 0.9*  MONOABS 0.4  EOSABS 0.1  BASOSABS 0.0   ------------------------------------------------------------------------------------------------------------------  Chemistries  Recent Labs  Lab 07/11/17 0939  NA 140  K 3.9  CL 108  CO2 23  GLUCOSE 107*  BUN 18  CREATININE 0.84  CALCIUM 9.4  AST 26  ALT 16  ALKPHOS 60  BILITOT 0.9   ------------------------------------------------------------------------------------------------------------------ estimated creatinine clearance is 44.9 mL/min (by C-G formula based on SCr of 0.84 mg/dL). ------------------------------------------------------------------------------------------------------------------ No results for input(s): TSH, T4TOTAL, T3FREE, THYROIDAB in the last 72 hours.  Invalid input(s): FREET3   Coagulation profile Recent Labs  Lab 07/11/17 0939  INR 0.95   ------------------------------------------------------------------------------------------------------------------- No results for input(s): DDIMER in the last 72 hours. -------------------------------------------------------------------------------------------------------------------  Cardiac Enzymes Recent Labs  Lab 07/11/17 0939  TROPONINI <0.03   ------------------------------------------------------------------------------------------------------------------ Invalid input(s):  POCBNP  ---------------------------------------------------------------------------------------------------------------  Urinalysis No results found for: COLORURINE, APPEARANCEUR, LABSPEC, PHURINE, GLUCOSEU, HGBUR, BILIRUBINUR, KETONESUR, PROTEINUR, UROBILINOGEN, NITRITE, LEUKOCYTESUR   RADIOLOGY: Ct Head Wo Contrast  Result Date: 07/11/2017 CLINICAL DATA:  Visual disturbance and inability to focus. Recent stroke. EXAM: CT HEAD WITHOUT CONTRAST TECHNIQUE: Contiguous axial images were obtained from the base of the skull through the vertex without intravenous contrast. COMPARISON:  Head CT 06/18/2017 and MRI 06/15/2017 FINDINGS: Brain: A subacute left PCA infarct involves the posteromedial temporal and occipital lobes and thalamus as on the prior studies. The amount of edema and associated local mass effect have decreased from the prior CT. There is no evidence of interval infarct extension. A small amount of curvilinear/gyral hyperattenuation within the left occipital infarction likely represents petechial hemorrhage. There is no evidence of new infarct, hydrocephalus, mass, midline shift, or extra-axial fluid collection. Vascular: No hyperdense vessel. Skull: No fracture. 6 mm sclerotic focus in the left frontal bone, unchanged and possibly representing a bone island. Sinuses/Orbits: Visualized paranasal sinuses and mastoid air cells are clear. Bilateral cataract extraction is noted. Other: None. IMPRESSION: 1. Evolving subacute left PCA infarct with decreased edema. Small amount of associated petechial hemorrhage. 2. No evidence of new intracranial abnormality. Electronically Signed   By: Sebastian AcheAllen  Grady M.D.   On: 07/11/2017 10:18    EKG: Orders placed or performed during the hospital encounter of 07/11/17  . ED EKG  . ED EKG    IMPRESSION AND PLAN:  * small intracranial hemorrhage   Neurology suggested to keep with frequent neuro checks ordered here, hold aspirin and no anticoagulation.  Repeat CT scan tomorrow morning to evaluate the progress.  * history of recent stroke   Hold aspirin, continue statin.  * hypertension   Amlodipine 10 mg oral daily started.  All the records are reviewed and case discussed with ED provider. Management plans discussed with the patient, family and they are in agreement.  CODE STATUS: DO NOT RESUSCITATE.    Code Status Orders  (From admission, onward)        Start     Ordered   07/11/17 1353  Do not attempt resuscitation (DNR)  Continuous    Question Answer Comment  In the event of cardiac or respiratory ARREST Do not call a "code blue"   In the event of cardiac or respiratory ARREST Do not perform Intubation, CPR, defibrillation or ACLS   In the event of cardiac or respiratory ARREST Use medication by any route, position, wound care, and other measures to relive pain and suffering. May use oxygen, suction and manual treatment of airway obstruction as needed for comfort.   Comments pt confirms      07/11/17 1353    Code Status History    Date Active Date Inactive Code Status Order ID Comments User Context   06/20/2017 15:32 06/28/2017 13:19 Full Code [id number]  Lynnae Prude Inpatient   06/20/2017 15:32 06/20/2017 15:32 Full Code [id number]  Lynnae Prude Inpatient   06/18/2017 20:34 06/20/2017 15:07 Full Code [id number]  Caryl Pina, MD Inpatient   06/15/2017 22:04 06/18/2017 20:34 DNR [id number]  Caryl Pina, MD ED    Advance Directive Documentation     Most Recent Value  Type of Advance Directive  Living will  Pre-existing out of facility DNR order (yellow form or pink MOST form)  No data  "MOST" Form in Place?  No data      Plan discussed with neurologist. TOTAL TIME TAKING CARE OF THIS PATIENT: 50 minutes.    Altamese Dilling M.D on 07/11/2017   Between 7am to 6pm - Pager - [phone]  After 6pm go to [email or web address] - password [email or web address]  [facility]  Office   [phone]  CC: Primary care physician; Leotis Shames, MD   Note: This dictation was prepared with Dragon dictation along with smaller phrase technology. Any transcriptional errors that result from this process are unintentional.

## 2017-07-11 NOTE — Consult Note (Signed)
Referring Physician: Elisabeth Pigeon    Chief Complaint: Balance difficulties, dizziness  HPI: [name] is an 82 y.o. female with a history of stroke approximately 3 weeks ago on ASA at home presented to the ED today with complaints of worsening dizziness and ataxia.  The patient reports when she got up this morning attempted to try to walk she almost fell due to inability to hold her balance.  She got back in bed.  When she got up later she still felt like she was not at her baseline.  Patient presented for evaluation at that time.  Reports that she is currently back to baseline other than feeling that her thinking is not quite as clear and that she is more tired than usual. Initial NIH stroke scale of 1.  Date last known well: Date: 07/10/2017 Time last known well: Time: 20:00 tPA Given: No: Hemorrhage on CT, outside time window  ICH Score: 0       Past Medical History:  Diagnosis Date  . Stroke [facility])     Past Surgical History:  Procedure Laterality Date  . LOOP RECORDER INSERTION N/A 06/20/2017   Procedure: LOOP RECORDER INSERTION;  Surgeon: [name], MD;  [facility]: [facility];  Service: Cardiovascular;  Laterality: N/A;  . TEE WITHOUT CARDIOVERSION N/A 06/19/2017   Procedure: TRANSESOPHAGEAL ECHOCARDIOGRAM (TEE) WITH LOOP;  Surgeon: Jake Bathe, MD;  Location: [facility] ENDOSCOPY;  Service: Cardiovascular;  Laterality: N/A;    Family History  Problem Relation Age of Onset  . Heart attack Father   . Breast cancer Sister   . Breast cancer Brother    Social History:  reports that  has never smoked. she has never used smokeless tobacco. She reports that she does not drink alcohol or use drugs.  Allergies: No Known Allergies  Medications:  I have reviewed the patient's current medications. Prior to Admission:  Medications Prior to Admission  Medication Sig Dispense Refill Last Dose  . aspirin EC 325 MG EC tablet Take 1 tablet (325 mg total) by mouth daily. 30 tablet 0  07/10/2017 at PM  . atorvastatin (LIPITOR) 20 MG tablet Take 1 tablet (20 mg total) by mouth daily at 6 PM. 30 tablet 0 07/10/2017 at PM   Scheduled: . atorvastatin  20 mg Oral q1800    ROS: History obtained from the patient  General ROS: fatigue Psychological ROS: as noted in HPI Ophthalmic ROS: vision loss from previous stroke ENT ROS: negative for - epistaxis, nasal discharge, oral lesions, sore throat, tinnitus or vertigo Allergy and Immunology ROS: negative for - hives or itchy/watery eyes Hematological and Lymphatic ROS: negative for - bleeding problems, bruising or swollen lymph nodes Endocrine ROS: negative for - galactorrhea, hair pattern changes, polydipsia/polyuria or temperature intolerance Respiratory ROS: negative for - cough, hemoptysis, shortness of breath or wheezing Cardiovascular ROS: negative for - chest pain, dyspnea on exertion, edema or irregular heartbeat Gastrointestinal ROS: negative for - abdominal pain, diarrhea, hematemesis, nausea/vomiting or stool incontinence Genito-Urinary ROS: negative for - dysuria, hematuria, incontinence or urinary frequency/urgency Musculoskeletal ROS: negative for - joint swelling or muscular weakness Neurological ROS: as noted in HPI Dermatological ROS: negative for rash and skin lesion changes  Physical Examination: Blood pressure (!) 190/89, pulse 77, temperature 97.6 F (36.4 C), temperature source Oral, resp. rate 20, height 5\' 3"  (1.6 m), weight 60.8 kg (134 lb), SpO2 99 %.  HEENT-  Normocephalic, no lesions, without obvious abnormality.  Normal external eye and conjunctiva.  Normal TM's  bilaterally.  Normal auditory canals and external ears. Normal external nose, mucus membranes and septum.  Normal pharynx. Cardiovascular- S1, S2 normal, pulses palpable throughout   Lungs- chest clear, no wheezing, rales, normal symmetric air entry Abdomen- soft, non-tender; bowel sounds normal; no masses,  no organomegaly Extremities- no  edema Lymph-no adenopathy palpable Musculoskeletal-no joint tenderness, deformity or swelling Skin-warm and dry, no hyperpigmentation, vitiligo, or suspicious lesions  Neurological Examination   Mental Status: Alert, oriented, thought content appropriate.  Word finding difficulties.  Able to follow 3 step commands without difficulty. Cranial Nerves: II: Discs flat bilaterally; right superior quadrantanopia, pupils equal, round, reactive to light and accommodation III,IV, VI: ptosis not present, extra-ocular motions intact bilaterally V,VII: smile symmetric, facial light touch sensation normal bilaterally VIII: hearing normal bilaterally IX,X: gag reflex present XI: bilateral shoulder shrug XII: midline tongue extension Motor: Right : Upper extremity   5/5    Left:     Upper extremity   5/5  Lower extremity   5/5     Lower extremity   5/5 Tone and bulk:normal tone throughout; no atrophy noted Sensory: Pinprick and light touch intact throughout, bilaterally Deep Tendon Reflexes: 2+ and symmetric with absent AJ's bilaterally Plantars: Right: downgoing   Left: downgoing Cerebellar: normal finger-to-nose, normal rapid alternating movements and normal heel-to-shin test Gait: not tested due to safety concerns    Laboratory Studies:  Basic Metabolic Panel: Recent Labs  Lab 07/11/17 0939  NA 140  K 3.9  CL 108  CO2 23  GLUCOSE 107*  BUN 18  CREATININE 0.84  CALCIUM 9.4    Liver Function Tests: Recent Labs  Lab 07/11/17 0939  AST 26  ALT 16  ALKPHOS 60  BILITOT 0.9  PROT 6.8  ALBUMIN 4.5   No results for input(s): LIPASE, AMYLASE in the last 168 hours. No results for input(s): AMMONIA in the last 168 hours.  CBC: Recent Labs  Lab 07/11/17 0939  WBC 5.3  NEUTROABS 3.9  HGB 13.5  HCT 39.6  MCV 95.2  PLT 144*    Cardiac Enzymes: Recent Labs  Lab 07/11/17 0939  TROPONINI <0.03    BNP: Invalid input(s): POCBNP  CBG: No results for input(s): GLUCAP in  the last 168 hours.  Microbiology: Results for orders placed or performed during the hospital encounter of 06/15/17  MRSA PCR Screening     Status: None   Collection Time: 06/16/17  1:35 AM  Result Value Ref Range Status   MRSA by PCR NEGATIVE NEGATIVE Final    Comment:        The GeneXpert MRSA Assay (FDA approved for NASAL specimens only), is one component of a comprehensive MRSA colonization surveillance program. It is not intended to diagnose MRSA infection nor to guide or monitor treatment for MRSA infections.     Coagulation Studies: Recent Labs    07/11/17 0939  LABPROT 12.6  INR 0.95    Urinalysis: No results for input(s): COLORURINE, LABSPEC, PHURINE, GLUCOSEU, HGBUR, BILIRUBINUR, KETONESUR, PROTEINUR, UROBILINOGEN, NITRITE, LEUKOCYTESUR in the last 168 hours.  Invalid input(s): APPERANCEUR  Lipid Panel:    Component Value Date/Time   CHOL 213 (H) 06/16/2017 0440   TRIG 65 06/16/2017 0440   HDL 80 06/16/2017 0440   CHOLHDL 2.7 06/16/2017 0440   VLDL 13 06/16/2017 0440   LDLCALC 120 (H) 06/16/2017 0440    HgbA1C:  Lab Results  Component Value Date   HGBA1C 5.2 06/16/2017    Urine Drug Screen:  No results found for:  LABOPIA, COCAINSCRNUR, LABBENZ, AMPHETMU, THCU, LABBARB  Alcohol Level: No results for input(s): ETH in the last 168 hours.  Other results: EKG: normal sinus rhythm at 75 bpm.  Imaging: Ct Head Wo Contrast  Result Date: 07/11/2017 CLINICAL DATA:  Visual disturbance and inability to focus. Recent stroke. EXAM: CT HEAD WITHOUT CONTRAST TECHNIQUE: Contiguous axial images were obtained from the base of the skull through the vertex without intravenous contrast. COMPARISON:  Head CT 06/18/2017 and MRI 06/15/2017 FINDINGS: Brain: A subacute left PCA infarct involves the posteromedial temporal and occipital lobes and thalamus as on the prior studies. The amount of edema and associated local mass effect have decreased from the prior CT. There is no  evidence of interval infarct extension. A small amount of curvilinear/gyral hyperattenuation within the left occipital infarction likely represents petechial hemorrhage. There is no evidence of new infarct, hydrocephalus, mass, midline shift, or extra-axial fluid collection. Vascular: No hyperdense vessel. Skull: No fracture. 6 mm sclerotic focus in the left frontal bone, unchanged and possibly representing a bone island. Sinuses/Orbits: Visualized paranasal sinuses and mastoid air cells are clear. Bilateral cataract extraction is noted. Other: None. IMPRESSION: 1. Evolving subacute left PCA infarct with decreased edema. Small amount of associated petechial hemorrhage. 2. No evidence of new intracranial abnormality. Electronically Signed   By: Sebastian AcheAllen  Grady M.D.   On: 07/11/2017 10:18    Assessment: 82 y.o. female with history of a stroke approximately 3 weeks ago on aspirin at home who presents with worsening neurological complaints.  Head CT reviewed and shows a small area of hemorrhage in her previous left PCA infarct.  With patient reporting that she does not feel like she is back to baseline we will monitor overnight and confirm that there is no worsening of hemorrhage after 24 hours.  Stroke Risk Factors - hyperlipidemia  Plan: 1. Hold ASA for now 2. Telemetry monitoring 3. Frequent neuro checks 4. Repeat head CT in 24 hours 5. PT/OT evaluation 6. Orthostatic vitals  Case discussed with Dr. Delane GingerVachhani  Cassidi Modesitt, MD Neurology [phone] 07/11/2017, 2:39 PM

## 2017-07-11 NOTE — Therapy (Signed)
[place] MAIN [facility] SERVICES [address] [place], [place], [address] Phone: [phone]   Fax:  [phone]  Patient Details  Name: [name] MRN: [id number] Date of Birth: 10-21-1935 Referring Provider:  Cathlyn Parsons, PA-C  Encounter Date: 07/11/2017   Upon arrival at OT , pt. reported that she was not well this morning. Pt. Reported that upon rising this morning she had severe dizziness, and vision changes which felt like she did at the onset of her stroke. Pt. had EMS come to the house. Pt. Reports they checked her vitals, and they were normal. Upon arrival to therapy, she reports dizziness, and worsening of her vision from the previous day. Pt. was immediately escorted to the ER for further evaluation. Pt.'s sister was called, and met the pt. in the ER.    Harrel Carina, MS, OTR/L 07/11/2017, 9:42 AM  [place] MAIN [facility] SERVICES [address] [place], [place], [address] Phone: [phone]   Fax:  [phone]

## 2017-07-11 NOTE — Progress Notes (Signed)
Family Meeting Note  Advance Directive:no  Today a meeting took place with the Patient.   The following clinical team members were present during this meeting:MD  The following were discussed:Patient's diagnosis: Stroke, intracranial bleed , Patient's progosis: Unable to determine and Goals for treatment: DNR  Additional follow-up to be provided: Neurologist.  Time spent during discussion:20 minutes  Altamese DillingVaibhavkumar Waqas Bruhl, MD

## 2017-07-11 NOTE — ED Notes (Signed)
Pt called out stating that her L leg from calf down to L foot is numb. Pt states entire R side of body has been numb since stroke but now she feels like L leg is going numb. Took pt shoes off. Pt moving L leg and feeling on L leg. Pt moving all extremities at this time.

## 2017-07-11 NOTE — ED Notes (Signed)
FN: pt brought over from PT, pt states she feels like she cannot "focus" today and is exp some vision problems. Pt states had a stroke back on Jan 3.

## 2017-07-11 NOTE — ED Triage Notes (Addendum)
Arrives from PT, with c/o waking up this morning and was feeling dizzy.  When she went to stand up from bed, legs were weak and almost fell to the floor.  Describes symptoms as similar to when she had a stroke earlier this month.  Patient had called EMS this morning who came out to her house and stroke screen was negative.

## 2017-07-11 NOTE — Therapy (Signed)
[facility] [address] [place], [place], [address] Phone: [phone]   Fax:  [phone]  Patient Details  Name: Angela SheererReva R Mchargue MRN: [id number] Date of Birth: 02/21/1936 Referring Provider:  Charlton AmorAngiulli, Daniel J, PA-C  Encounter Date: 07/11/2017 Patient arrived to treatment and needed to go to the ER due to vision problems.  [address] [place] [address], Raunak Antuna S, [place] DPT 07/11/2017, 12:22 PM   [facility] [address] [place], [place], [address] Phone: [phone]   Fax:  [phone]

## 2017-07-11 NOTE — Progress Notes (Signed)
PT Cancellation Note  Patient Details Name: [name] MRN: [id number] DOB: 05/14/1936   Cancelled Treatment:    Reason Eval/Treat Not Completed: Other (comment). Received consult, however pt noted with hemorrhage of previous CVA on CT. Plans for repeat CT scan to follow any progression. Will hold at this time and re-attempt next date if medically cleared.   Marissia Blackham 07/11/2017, 4:22 PM Elizabeth PalauStephanie Anely Spiewak, PT, DPT [phone]

## 2017-07-11 NOTE — ED Provider Notes (Signed)
[facility] Emergency Department Provider Note       Time seen: ----------------------------------------- 10:25 AM on 07/11/2017 -----------------------------------------   I have reviewed the triage vital signs and the nursing notes.  HISTORY   Chief Complaint Dizziness    HPI [name] is a 82 y.o. female with a history of a CVA that occurred this month who presents to the ED for worsening dizziness and ataxia when she got up this morning.  She noted that her right-sided peripheral vision was diminished as well.  These 2 problems were caused by the recent stroke of this month but had been improving according to the patient.  Patient reports when she went to stand up from her bed her legs were weak and she almost fell.  She denies any recent illness or other complaints.  Currently the symptoms are resolving.  Past Medical History:  Diagnosis Date  . Stroke [facility])     Patient Active Problem List   Diagnosis Date Noted  . Cognitive deficit, post-stroke   . Hyperglycemia   . Hypertension   . Neurologic gait disorder   . Cerebrovascular accident (CVA) due to occlusion of left posterior communicating artery (HCC) 06/20/2017  . Stroke (cerebrum) (HCC) 06/15/2017    Past Surgical History:  Procedure Laterality Date  . LOOP RECORDER INSERTION N/A 06/20/2017   Procedure: LOOP RECORDER INSERTION;  Surgeon: [name], MD;  [facility];  Service: Cardiovascular;  Laterality: N/A;  . TEE WITHOUT CARDIOVERSION N/A 06/19/2017   Procedure: TRANSESOPHAGEAL ECHOCARDIOGRAM (TEE) WITH LOOP;  Surgeon: Jake Bathe, MD;  Location: [facility] ENDOSCOPY;  Service: Cardiovascular;  Laterality: N/A;    Allergies Patient has no known allergies.  Social History Social History   Tobacco Use  . Smoking status: Never Smoker  . Smokeless tobacco: Never Used  . Tobacco comment: None  Substance Use Topics  . Alcohol use: No    Frequency: Never     Comment: None  . Drug use: No    Comment: None   Review of Systems Constitutional: Negative for fever. Eyes: Positive for vision changes ENT:  Negative for congestion, sore throat Cardiovascular: Negative for chest pain. Respiratory: Negative for shortness of breath. Gastrointestinal: Negative for abdominal pain, vomiting and diarrhea. Musculoskeletal: Negative for back pain. Skin: Negative for rash. Neurological: Negative for headaches, positive for generalized weakness, positive for ataxia  All systems negative/normal/unremarkable except as stated in the HPI  ____________________________________________   PHYSICAL EXAM:  VITAL SIGNS: ED Triage Vitals  Enc Vitals Group     BP 07/11/17 0941 (!) 168/95     Pulse Rate 07/11/17 0941 77     Resp 07/11/17 0941 16     Temp 07/11/17 0941 98.2 F (36.8 C)     Temp Source 07/11/17 0941 Oral     SpO2 07/11/17 0941 98 %     Weight 07/11/17 0942 134 lb (60.8 kg)     Height 07/11/17 0942 5\' 3"  (1.6 m)     Head Circumference --      Peak Flow --      Pain Score 07/11/17 0942 0     Pain Loc --      Pain Edu? --      Excl. in GC? --    Constitutional: Alert and oriented. Well appearing and in no distress. Eyes: Conjunctivae are normal. Normal extraocular movements. ENT   Head: Normocephalic and atraumatic.   Nose: No congestion/rhinnorhea.   Mouth/Throat: Mucous membranes are moist.  Neck: No stridor. Cardiovascular: Normal rate, regular rhythm. No murmurs, rubs, or gallops. Respiratory: Normal respiratory effort without tachypnea nor retractions. Breath sounds are clear and equal bilaterally. No wheezes/rales/rhonchi. Gastrointestinal: Soft and nontender. Normal bowel sounds Musculoskeletal: Nontender with normal range of motion in extremities. No lower extremity tenderness nor edema. Neurologic:  Normal speech and language. No gross focal neurologic deficits are appreciated.  Weakly positive Romberg sign, otherwise  strength, sensation, cranial nerves are normal.  We did ambulate her and she was slightly off balance Skin:  Skin is warm, dry and intact. No rash noted. Psychiatric: Mood and affect are normal. Speech and behavior are normal.  ____________________________________________  EKG: Interpreted by me.  Sinus rhythm the rate is 75 bpm, normal PR interval, normal QRS, normal QT.  ____________________________________________  ED COURSE:  As part of my medical decision making, I reviewed the following data within the electronic MEDICAL RECORD NUMBER History obtained from family if available, nursing notes, old chart and ekg, as well as notes from prior ED visits. Patient presented for dizziness, we will assess with labs and imaging as indicated at this time. Clinical Course as of Jul 11 1057  Tue Jul 11, 2017  1046 Patient discussed with Dr. Thad Rangereynolds, will continue ASA.   [JW]    Clinical Course User Index [JW] Emily FilbertWilliams, Jonathan E, MD   Procedures ____________________________________________   LABS (pertinent positives/negatives)  Labs Reviewed  CBC - Abnormal; Notable for the following components:      Result Value   Platelets 144 (*)    All other components within normal limits  DIFFERENTIAL - Abnormal; Notable for the following components:   Lymphs Abs 0.9 (*)    All other components within normal limits  COMPREHENSIVE METABOLIC PANEL - Abnormal; Notable for the following components:   Glucose, Bld 107 (*)    All other components within normal limits  PROTIME-INR  APTT  TROPONIN I  CBG MONITORING, ED    RADIOLOGY Images were viewed by me  CT head IMPRESSION: 1. Evolving subacute left PCA infarct with decreased edema. Small amount of associated petechial hemorrhage. 2. No evidence of new intracranial abnormality. ____________________________________________  DIFFERENTIAL DIAGNOSIS   CVA, intracranial hemorrhage, dehydration, electrolyte abnormality, orthostatic  hypotension  FINAL ASSESSMENT AND PLAN  Dizziness, evolving subacute left PCA infarct   Plan: Patient had presented for dizziness that was worse this morning. Patient's labs did not reveal any acute process. Patient's imaging did reveal some very small area of hemorrhagic transformation of her previous CVA.  Patient was discussed with neurology who recommends admission, neurologic examinations and repeat CT imaging in the morning.   Emily FilbertWilliams, Jonathan E, MD   Note: This note was generated in part or whole with voice recognition software. Voice recognition is usually quite accurate but there are transcription errors that can and very often do occur. I apologize for any typographical errors that were not detected and corrected.     Emily FilbertWilliams, Jonathan E, MD 07/11/17 1059

## 2017-07-12 ENCOUNTER — Inpatient Hospital Stay: Payer: Medicare Other

## 2017-07-12 ENCOUNTER — Encounter: Payer: Medicare Other | Admitting: Occupational Therapy

## 2017-07-12 ENCOUNTER — Ambulatory Visit: Payer: Medicare Other

## 2017-07-12 DIAGNOSIS — E785 Hyperlipidemia, unspecified: Secondary | ICD-10-CM | POA: Diagnosis not present

## 2017-07-12 DIAGNOSIS — E860 Dehydration: Secondary | ICD-10-CM | POA: Diagnosis not present

## 2017-07-12 DIAGNOSIS — Z8249 Family history of ischemic heart disease and other diseases of the circulatory system: Secondary | ICD-10-CM | POA: Diagnosis not present

## 2017-07-12 DIAGNOSIS — I619 Nontraumatic intracerebral hemorrhage, unspecified: Secondary | ICD-10-CM | POA: Diagnosis not present

## 2017-07-12 DIAGNOSIS — I10 Essential (primary) hypertension: Secondary | ICD-10-CM | POA: Diagnosis not present

## 2017-07-12 DIAGNOSIS — I951 Orthostatic hypotension: Secondary | ICD-10-CM | POA: Diagnosis not present

## 2017-07-12 DIAGNOSIS — H539 Unspecified visual disturbance: Secondary | ICD-10-CM | POA: Diagnosis not present

## 2017-07-12 DIAGNOSIS — R42 Dizziness and giddiness: Secondary | ICD-10-CM | POA: Diagnosis present

## 2017-07-12 DIAGNOSIS — Z66 Do not resuscitate: Secondary | ICD-10-CM | POA: Diagnosis not present

## 2017-07-12 DIAGNOSIS — I629 Nontraumatic intracranial hemorrhage, unspecified: Secondary | ICD-10-CM | POA: Diagnosis not present

## 2017-07-12 DIAGNOSIS — R29701 NIHSS score 1: Secondary | ICD-10-CM | POA: Diagnosis not present

## 2017-07-12 DIAGNOSIS — R4701 Aphasia: Secondary | ICD-10-CM | POA: Diagnosis not present

## 2017-07-12 DIAGNOSIS — Z7982 Long term (current) use of aspirin: Secondary | ICD-10-CM | POA: Diagnosis not present

## 2017-07-12 DIAGNOSIS — I69393 Ataxia following cerebral infarction: Secondary | ICD-10-CM | POA: Diagnosis not present

## 2017-07-12 DIAGNOSIS — Z79899 Other long term (current) drug therapy: Secondary | ICD-10-CM | POA: Diagnosis not present

## 2017-07-12 LAB — BASIC METABOLIC PANEL
ANION GAP: 7 (ref 5–15)
BUN: 16 mg/dL (ref 6–20)
CALCIUM: 9.5 mg/dL (ref 8.9–10.3)
CO2: 25 mmol/L (ref 22–32)
Chloride: 108 mmol/L (ref 101–111)
Creatinine, Ser: 0.8 mg/dL (ref 0.44–1.00)
GFR calc Af Amer: >60 mL/min (ref >60)
GLUCOSE: 104 mg/dL — AB (ref 65–99)
POTASSIUM: 4.1 mmol/L (ref 3.5–5.1)
SODIUM: 140 mmol/L (ref 135–145)

## 2017-07-12 LAB — CBC
HEMATOCRIT: 37.6 % (ref 35.0–47.0)
HEMOGLOBIN: 12.7 g/dL (ref 12.0–16.0)
MCH: 32.2 pg (ref 26.0–34.0)
MCHC: 33.9 g/dL (ref 32.0–36.0)
MCV: 95 fL (ref 80.0–100.0)
Platelets: 133 10*3/uL — ABNORMAL LOW (ref 150–440)
RBC: 3.96 MIL/uL (ref 3.80–5.20)
RDW: 12.9 % (ref 11.5–14.5)
WBC: 5.2 10*3/uL (ref 3.6–11.0)

## 2017-07-12 MED ORDER — ASPIRIN EC 81 MG PO TBEC: 81.0000 mg | DELAYED_RELEASE_TABLET | Freq: Every day | ORAL | 2 refills | Status: DC

## 2017-07-12 NOTE — Plan of Care (Signed)
VSS, free of falls during shift.  Ambulated to BR, SB assist.  NIH 2, neuro checks stable.  Denies pain, no needs overnight.  Bed in low position, bed alarm on.  Call bell within reach, WCTM.

## 2017-07-12 NOTE — Progress Notes (Signed)
PT Cancellation Note  Patient Details Name: Cecile SheererReva R Menor MRN: [id number] DOB: 10/23/1935   Cancelled Treatment:    Reason Eval/Treat Not Completed: Other (comment). Pt still pending repeat CT scan. Spoke with leader. Will hold therapy consult until results reviewed.   Johnica Armwood 07/12/2017, 11:02 AM  Elizabeth PalauStephanie Burel Kahre, PT, DPT [phone]

## 2017-07-12 NOTE — Progress Notes (Addendum)
Subjective: No new complaints.    Objective: Current vital signs: BP 121/76 (BP Location: Right Arm)   Pulse 73   Temp 98.3 F (36.8 C) (Oral)   Resp 16   Ht 5\' 3"  (1.6 m)   Wt 60.8 kg (134 lb)   SpO2 96%   BMI 23.74 kg/m  Vital signs in last 24 hours: Temp:  [97.6 F (36.4 C)-98.3 F (36.8 C)] 98.3 F (36.8 C) (01/30 0341) Pulse Rate:  [73-80] 73 (01/30 0341) Resp:  [16-23] 16 (01/30 0341) BP: (121-190)/(76-98) 121/76 (01/30 0341) SpO2:  [96 %-100 %] 96 % (01/30 0341)  Intake/Output from previous day: 01/29 0701 - 01/30 0700 In: 480 [P.O.:480] Out: -  Intake/Output this shift: Total I/O In: 360 [P.O.:360] Out: -  Nutritional status: Diet Heart Room service appropriate? Yes; Fluid consistency: Thin  Neurologic Exam: Mental Status: Alert, oriented, thought content appropriate.  Word finding difficulties.  Able to follow 3 step commands without difficulty. Cranial Nerves: II: Discs flat bilaterally; right superior quadrantanopia, pupils equal, round, reactive to light and accommodation III,IV, VI: ptosis not present, extra-ocular motions intact bilaterally V,VII: smile symmetric, facial light touch sensation normal bilaterally VIII: hearing normal bilaterally IX,X: gag reflex present XI: bilateral shoulder shrug XII: midline tongue extension Motor: 5/5 throughout   Lab Results: Basic Metabolic Panel: Recent Labs  Lab 07/11/17 0939 07/12/17 0414  NA 140 140  K 3.9 4.1  CL 108 108  CO2 23 25  GLUCOSE 107* 104*  BUN 18 16  CREATININE 0.84 0.80  CALCIUM 9.4 9.5    Liver Function Tests: Recent Labs  Lab 07/11/17 0939  AST 26  ALT 16  ALKPHOS 60  BILITOT 0.9  PROT 6.8  ALBUMIN 4.5   No results for input(s): LIPASE, AMYLASE in the last 168 hours. No results for input(s): AMMONIA in the last 168 hours.  CBC: Recent Labs  Lab 07/11/17 0939 07/12/17 0414  WBC 5.3 5.2  NEUTROABS 3.9  --   HGB 13.5 12.7  HCT 39.6 37.6  MCV 95.2 95.0  PLT  144* 133*    Cardiac Enzymes: Recent Labs  Lab 07/11/17 0939  TROPONINI <0.03    Lipid Panel: No results for input(s): CHOL, TRIG, HDL, CHOLHDL, VLDL, LDLCALC in the last 168 hours.  CBG: No results for input(s): GLUCAP in the last 168 hours.  Microbiology: Results for orders placed or performed during the hospital encounter of 06/15/17  MRSA PCR Screening     Status: None   Collection Time: 06/16/17  1:35 AM  Result Value Ref Range Status   MRSA by PCR NEGATIVE NEGATIVE Final    Comment:        The GeneXpert MRSA Assay (FDA approved for NASAL specimens only), is one component of a comprehensive MRSA colonization surveillance program. It is not intended to diagnose MRSA infection nor to guide or monitor treatment for MRSA infections.     Coagulation Studies: Recent Labs    07/11/17 0939  LABPROT 12.6  INR 0.95    Imaging: Ct Head Wo Contrast  Result Date: 07/12/2017 CLINICAL DATA:  Continued surveillance PCA infarct. EXAM: CT HEAD WITHOUT CONTRAST TECHNIQUE: Contiguous axial images were obtained from the base of the skull through the vertex without intravenous contrast. COMPARISON:  07/11/2016. FINDINGS: Brain: Subacute LEFT PCA infarct, with reperfusion petechial hemorrhage centrally, stable. Both cytotoxic and vasogenic edema are present, unchanged from 07/11/2016. No new areas of infarction, hemorrhage, or extra-axial fluid collection. No midline shift or hydrocephalus. Vascular: No  hyperdense vessel or unexpected calcification. Skull: Normal. Negative for fracture or focal lesion. Sinuses/Orbits: No acute finding. Other: None. IMPRESSION: Stable appearance of subacute LEFT PCA infarct, small amount of petechial hemorrhage and mixed pattern of edema are unchanged from 07/11/2016. Electronically Signed   By: Elsie StainJohn T Curnes M.D.   On: 07/12/2017 12:00   Ct Head Wo Contrast  Result Date: 07/11/2017 CLINICAL DATA:  Visual disturbance and inability to focus. Recent  stroke. EXAM: CT HEAD WITHOUT CONTRAST TECHNIQUE: Contiguous axial images were obtained from the base of the skull through the vertex without intravenous contrast. COMPARISON:  Head CT 06/18/2017 and MRI 06/15/2017 FINDINGS: Brain: A subacute left PCA infarct involves the posteromedial temporal and occipital lobes and thalamus as on the prior studies. The amount of edema and associated local mass effect have decreased from the prior CT. There is no evidence of interval infarct extension. A small amount of curvilinear/gyral hyperattenuation within the left occipital infarction likely represents petechial hemorrhage. There is no evidence of new infarct, hydrocephalus, mass, midline shift, or extra-axial fluid collection. Vascular: No hyperdense vessel. Skull: No fracture. 6 mm sclerotic focus in the left frontal bone, unchanged and possibly representing a bone island. Sinuses/Orbits: Visualized paranasal sinuses and mastoid air cells are clear. Bilateral cataract extraction is noted. Other: None. IMPRESSION: 1. Evolving subacute left PCA infarct with decreased edema. Small amount of associated petechial hemorrhage. 2. No evidence of new intracranial abnormality. Electronically Signed   By: Sebastian AcheAllen  Grady M.D.   On: 07/11/2017 10:18    Medications:  I have reviewed the patient's current medications. Scheduled: . amLODipine  10 mg Oral Daily  . atorvastatin  20 mg Oral q1800    Assessment/Plan: Patient stable.  No new neurological complaints.  Repeat head CT reviewed and shows no change in petechial hemorrhage.    Recommendations: 1.  Change ASA to 81mg  daily 2.  Patient to continue with outpatient follow up.     LOS: 0 days   Thana FarrLeslie Maxum Cassarino, MD Neurology [phone] 07/12/2017  12:21 PM

## 2017-07-12 NOTE — Discharge Summary (Signed)
[facility] - [place] [facility]  [name], 82 y.o., DOB 10/04/1935, MRN [id number]. Admission date: 07/11/2017 Discharge Date 07/12/2017 Primary MD Leotis ShamesSingh, Jasmine, MD Admitting Physician Altamese DillingVaibhavkumar Vachhani, MD  Admission Diagnosis  Dizziness [R42] Ataxia due to recent cerebrovascular accident (CVA) [I69.393]  Discharge Diagnosis   Principal Problem:   Cerebral hemorrhage [facility](HCC) Recent left PCA infarct Hyperlipidemia      Hospital Course  Patient 82 year old female with recent history of stroke presented with complaint of dizziness and ataxia.  Patient had a CT scan in the ER which showed some mild petechial hemorrhage around her recent stroke.  Therefore she was admitted to the hospital.  She was seen by neurology who recommended monitoring her.  And a repeat CT scan.  Repeat CT scan is stable this morning patient's symptoms have resolved.  She was on aspirin 325 now recommended by neurology to do 81 mg.  Patient is very anxious to go home she is ambulating most of her symptoms have resolved she is stable for discharge.            Consults  neurology  Significant Tests:  See full reports for all details     Ct Angio Head W Or Wo Contrast  Result Date: 06/15/2017 CLINICAL DATA:  Fall and right arm numbness with aphasia that resolved. Right homonymous hemianopia. EXAM: CT ANGIOGRAPHY HEAD AND NECK CT PERFUSION BRAIN TECHNIQUE: Multidetector CT imaging of the head and neck was performed using the standard protocol during bolus administration of intravenous contrast. Multiplanar CT image reconstructions and MIPs were obtained to evaluate the vascular anatomy. Carotid stenosis measurements (when applicable) are obtained utilizing NASCET criteria, using the distal internal carotid diameter as the denominator. Multiphase CT imaging of the brain was performed following IV bolus contrast injection. Subsequent parametric perfusion maps were calculated using RAPID  software. CONTRAST:  100mL ISOVUE-370 IOPAMIDOL (ISOVUE-370) INJECTION 76% COMPARISON:  Noncontrast head CT earlier today FINDINGS: CTA NECK FINDINGS Aortic arch: No acute finding.  Three vessel branching. Right carotid system: Mild atherosclerotic plaque at the ICA bulb. No beading, stenosis, or ulceration. Left carotid system: Mild atherosclerotic plaque at the common carotid bifurcation and ICA bulb. No stenosis, ulceration, or beading. Vertebral arteries: No proximal subclavian stenosis. Mild atheromatous plaque at the vertebral origins. No stenosis or beading. Skeleton: No acute finding. Other neck: No incidental mass or inflammation. Upper chest: Negative Review of the MIP images confirms the above findings CTA HEAD FINDINGS Anterior circulation: No noted atheromatous changes. No branch occlusion or stenosis. Negative for beading or aneurysm. Posterior circulation: Vertebral and basilar arteries are smooth and widely patent. There is a left P2 segment occlusion or pre occlusion with flow gap. Venous sinuses: Patent Anatomic variants: None significant Delayed phase: No abnormal intracranial enhancement. Review of the MIP images confirms the above findings CT Brain Perfusion Findings: CBF (<30%) Volume: 0mL Perfusion (Tmax>6.0s) volume: 13mL These results were called by telephone at the time of interpretation on 06/15/2017 at 7:08 pm to Dr. Shaune PollackLord. Patient has already been given tPA and is being transferred. IMPRESSION: 1. Left P2 segment occlusion with downstream reconstitution. No infarct by CTP but there is 13cc of measurable left occipital lobe penumbra. 2. Very mild atheromatous changes for age. No stenosis or noted embolic souce. Electronically Signed   By: Marnee SpringJonathon  Watts M.D.   On: 06/15/2017 19:20   Ct Head Wo Contrast  Result Date: 07/12/2017 CLINICAL DATA:  Continued surveillance PCA infarct. EXAM: CT HEAD WITHOUT CONTRAST TECHNIQUE: Contiguous  axial images were obtained from the base of the skull  through the vertex without intravenous contrast. COMPARISON:  07/11/2016. FINDINGS: Brain: Subacute LEFT PCA infarct, with reperfusion petechial hemorrhage centrally, stable. Both cytotoxic and vasogenic edema are present, unchanged from 07/11/2016. No new areas of infarction, hemorrhage, or extra-axial fluid collection. No midline shift or hydrocephalus. Vascular: No hyperdense vessel or unexpected calcification. Skull: Normal. Negative for fracture or focal lesion. Sinuses/Orbits: No acute finding. Other: None. IMPRESSION: Stable appearance of subacute LEFT PCA infarct, small amount of petechial hemorrhage and mixed pattern of edema are unchanged from 07/11/2016. Electronically Signed   By: Elsie Stain M.D.   On: 07/12/2017 12:00   Ct Head Wo Contrast  Result Date: 07/11/2017 CLINICAL DATA:  Visual disturbance and inability to focus. Recent stroke. EXAM: CT HEAD WITHOUT CONTRAST TECHNIQUE: Contiguous axial images were obtained from the base of the skull through the vertex without intravenous contrast. COMPARISON:  Head CT 06/18/2017 and MRI 06/15/2017 FINDINGS: Brain: A subacute left PCA infarct involves the posteromedial temporal and occipital lobes and thalamus as on the prior studies. The amount of edema and associated local mass effect have decreased from the prior CT. There is no evidence of interval infarct extension. A small amount of curvilinear/gyral hyperattenuation within the left occipital infarction likely represents petechial hemorrhage. There is no evidence of new infarct, hydrocephalus, mass, midline shift, or extra-axial fluid collection. Vascular: No hyperdense vessel. Skull: No fracture. 6 mm sclerotic focus in the left frontal bone, unchanged and possibly representing a bone island. Sinuses/Orbits: Visualized paranasal sinuses and mastoid air cells are clear. Bilateral cataract extraction is noted. Other: None. IMPRESSION: 1. Evolving subacute left PCA infarct with decreased edema. Small  amount of associated petechial hemorrhage. 2. No evidence of new intracranial abnormality. Electronically Signed   By: Sebastian Ache M.D.   On: 07/11/2017 10:18   Ct Head Wo Contrast  Result Date: 06/18/2017 CLINICAL DATA:  Continued surveillance LEFT PCA infarct, dizziness today. EXAM: CT HEAD WITHOUT CONTRAST TECHNIQUE: Contiguous axial images were obtained from the base of the skull through the vertex without intravenous contrast. COMPARISON:  Multiple priors.  Most recent CT 06/16/2017. FINDINGS: Brain: Increased edema and mass effect associated with the acute LEFT PCA territory infarction involving the medial and posterior temporal lobe and occipital lobes. Central heterogeneity suggests reperfusion microhemorrhage. No similar heterogeneity in the LEFT thalamus. No new areas of infarction. No midline shift. Vascular: No hyperdense vessel or unexpected calcification. Skull: Normal. Negative for fracture or focal lesion. Sinuses/Orbits: No acute finding. Other: None. IMPRESSION: Increased edema and mass effect associated with the LEFT PCA territory infarction in the occipital and temporal lobes, when compared with most recent prior CT 06/16/2017. Central heterogeneity suggests reperfusion microhemorrhage. If clinically indicated, MR is more sensitive in the detection of such. A call has been placed to the ordering provider. Electronically Signed   By: Elsie Stain M.D.   On: 06/18/2017 11:22   Ct Head Wo Contrast  Result Date: 06/16/2017 CLINICAL DATA:  82 y/o  F; 24 hours tPA follow-up. EXAM: CT HEAD WITHOUT CONTRAST TECHNIQUE: Contiguous axial images were obtained from the base of the skull through the vertex without intravenous contrast. COMPARISON:  06/15/2016 CT head, CT perfusion head, CT angiogram head, and MRI head. FINDINGS: Brain: Stable distribution of left PCA infarction when compared with prior MRI diffusion signal abnormality. Interval increase in edema and local mass effect with effacement of  temporal horn of left lateral ventricle. No hemorrhage. No new area  of infarction. No midline shift, effacement of basilar cisterns, or extra-axial collection. Vascular: No hyperdense vessel or unexpected calcification. Skull: Normal. Negative for fracture or focal lesion. Sinuses/Orbits: No acute finding. Other: Bilateral intra-ocular lens replacement. IMPRESSION: 1. Stable distribution of left PCA infarct. Interval increase in edema and local mass effect. No hemorrhage or herniation. 2. No new acute intracranial abnormality identified. Electronically Signed   By: Mitzi Hansen M.D.   On: 06/16/2017 17:53   Ct Head Wo Contrast  Result Date: 06/15/2017 CLINICAL DATA:  Altered level of consciousness.  Fall.  Aphasia. EXAM: CT HEAD WITHOUT CONTRAST TECHNIQUE: Contiguous axial images were obtained from the base of the skull through the vertex without intravenous contrast. COMPARISON:  None. FINDINGS: Brain: There is no evidence of acute infarct, intracranial hemorrhage, mass, midline shift, or extra-axial fluid collection. The ventricles and sulci are normal. Vascular: No hyperdense vessel. Skull: No fracture or suspicious osseous lesion. 5 mm sclerotic focus in the left frontal bone, possibly a bone island. Sinuses/Orbits: Visualized paranasal sinuses and mastoid air cells are clear. Bilateral cataract extraction. Other: None. IMPRESSION: Negative head CT. These results were called by telephone at the time of interpretation on 06/15/2017 at 5:27 pm to Dr. Governor Rooks , who verbally acknowledged these results. Electronically Signed   By: Sebastian Ache M.D.   On: 06/15/2017 17:28   Ct Angio Neck W Or Wo Contrast  Result Date: 06/15/2017 CLINICAL DATA:  Fall and right arm numbness with aphasia that resolved. Right homonymous hemianopia. EXAM: CT ANGIOGRAPHY HEAD AND NECK CT PERFUSION BRAIN TECHNIQUE: Multidetector CT imaging of the head and neck was performed using the standard protocol during bolus  administration of intravenous contrast. Multiplanar CT image reconstructions and MIPs were obtained to evaluate the vascular anatomy. Carotid stenosis measurements (when applicable) are obtained utilizing NASCET criteria, using the distal internal carotid diameter as the denominator. Multiphase CT imaging of the brain was performed following IV bolus contrast injection. Subsequent parametric perfusion maps were calculated using RAPID software. CONTRAST:  ISOVUE-370 IOPAMIDOL (ISOVUE-370) INJECTION 76% COMPARISON:  Noncontrast head CT earlier today FINDINGS: CTA NECK FINDINGS Aortic arch: No acute finding.  Three vessel branching. Right carotid system: Mild atherosclerotic plaque at the ICA bulb. No beading, stenosis, or ulceration. Left carotid system: Mild atherosclerotic plaque at the common carotid bifurcation and ICA bulb. No stenosis, ulceration, or beading. Vertebral arteries: No proximal subclavian stenosis. Mild atheromatous plaque at the vertebral origins. No stenosis or beading. Skeleton: No acute finding. Other neck: No incidental mass or inflammation. Upper chest: Negative Review of the MIP images confirms the above findings CTA HEAD FINDINGS Anterior circulation: No noted atheromatous changes. No branch occlusion or stenosis. Negative for beading or aneurysm. Posterior circulation: Vertebral and basilar arteries are smooth and widely patent. There is a left P2 segment occlusion or pre occlusion with flow gap. Venous sinuses: Patent Anatomic variants: None significant Delayed phase: No abnormal intracranial enhancement. Review of the MIP images confirms the above findings CT Brain Perfusion Findings: CBF (<30%) Volume: 0mL Perfusion (Tmax>6.0s) volume: 13mL These results were called by telephone at the time of interpretation on 06/15/2017 at 7:08 pm to Dr. Shaune Pollack. Patient has already been given tPA and is being transferred. IMPRESSION: 1. Left P2 segment occlusion with downstream reconstitution. No  infarct by CTP but there is 13cc of measurable left occipital lobe penumbra. 2. Very mild atheromatous changes for age. No stenosis or noted embolic souce. Electronically Signed   By: Kathrynn Ducking.D.  On: 06/15/2017 19:20   Mr Brain Wo Contrast  Result Date: 06/16/2017 CLINICAL DATA:  Follow-up examination for stroke. Right-sided numbness. EXAM: MRI HEAD WITHOUT CONTRAST TECHNIQUE: Multiplanar, multiecho pulse sequences of the brain and surrounding structures were obtained without intravenous contrast. COMPARISON:  Prior CTs from 06/15/2016. FINDINGS: Brain: Study degraded by motion artifact. Cerebral volume normal. Minimal T2/FLAIR hyperintensity within the periventricular white matter, most like related chronic small vessel ischemic disease, felt to be within normal limits for age. Moderate-sized confluent area of restricted diffusion seen involving the parasagittal left temporal occipital region, consistent with acute left PCA territory infarct. Involvement of the left thalamus. No associated mass effect or hemorrhage. No other evidence for acute or subacute ischemia. Gray-white matter differentiation otherwise maintained. No mass lesion, midline shift or mass effect. No hydrocephalus. No extra-axial fluid collection. Pituitary gland suprasellar region normal. Midline structures intact and normal. Vascular: Major intracranial vascular flow voids maintained at the skullbase. Skull and upper cervical spine: Craniocervical junction normal. Upper cervical spine within normal limits. Bone marrow signal intensity normal. No scalp soft tissue abnormality. Sinuses/Orbits: Globes normal soft tissues within normal limits. Patient status post cataract extraction bilaterally. Paranasal sinuses are clear. Trace right mastoid effusion. Inner ear structures normal. Other: None IMPRESSION: 1. Moderate sized acute ischemic nonhemorrhagic left PCA territory infarct. 2. Otherwise normal brain MRI. Electronically Signed    By: Rise Mu M.D.   On: 06/16/2017 00:49   Ct Cerebral Perfusion W Contrast  Result Date: 06/15/2017 CLINICAL DATA:  Fall and right arm numbness with aphasia that resolved. Right homonymous hemianopia. EXAM: CT ANGIOGRAPHY HEAD AND NECK CT PERFUSION BRAIN TECHNIQUE: Multidetector CT imaging of the head and neck was performed using the standard protocol during bolus administration of intravenous contrast. Multiplanar CT image reconstructions and MIPs were obtained to evaluate the vascular anatomy. Carotid stenosis measurements (when applicable) are obtained utilizing NASCET criteria, using the distal internal carotid diameter as the denominator. Multiphase CT imaging of the brain was performed following IV bolus contrast injection. Subsequent parametric perfusion maps were calculated using RAPID software. CONTRAST:  ISOVUE-370 IOPAMIDOL (ISOVUE-370) INJECTION 76% COMPARISON:  Noncontrast head CT earlier today FINDINGS: CTA NECK FINDINGS Aortic arch: No acute finding.  Three vessel branching. Right carotid system: Mild atherosclerotic plaque at the ICA bulb. No beading, stenosis, or ulceration. Left carotid system: Mild atherosclerotic plaque at the common carotid bifurcation and ICA bulb. No stenosis, ulceration, or beading. Vertebral arteries: No proximal subclavian stenosis. Mild atheromatous plaque at the vertebral origins. No stenosis or beading. Skeleton: No acute finding. Other neck: No incidental mass or inflammation. Upper chest: Negative Review of the MIP images confirms the above findings CTA HEAD FINDINGS Anterior circulation: No noted atheromatous changes. No branch occlusion or stenosis. Negative for beading or aneurysm. Posterior circulation: Vertebral and basilar arteries are smooth and widely patent. There is a left P2 segment occlusion or pre occlusion with flow gap. Venous sinuses: Patent Anatomic variants: None significant Delayed phase: No abnormal intracranial enhancement.  Review of the MIP images confirms the above findings CT Brain Perfusion Findings: CBF (<30%) Volume: 0mL Perfusion (Tmax>6.0s) volume: 13mL These results were called by telephone at the time of interpretation on 06/15/2017 at 7:08 pm to Dr. Shaune Pollack. Patient has already been given tPA and is being transferred. IMPRESSION: 1. Left P2 segment occlusion with downstream reconstitution. No infarct by CTP but there is 13cc of measurable left occipital lobe penumbra. 2. Very mild atheromatous changes for age. No stenosis or noted embolic souce.  Electronically Signed   By: Marnee Spring M.D.   On: 06/15/2017 19:20       Today   Subjective:   [name] feels well wants to go home  Objective:   Blood pressure 121/76, pulse 73, temperature 98.3 F (36.8 C), temperature source Oral, resp. rate 16, height 5\' 3"  (1.6 m), weight 134 lb (60.8 kg), SpO2 96 %.  .  Intake/Output Summary (Last 24 hours) at 07/12/2017 1307 Last data filed at 07/12/2017 0900 Gross per 24 hour  Intake 840 ml  Output -  Net 840 ml    Exam VITAL SIGNS: Blood pressure 121/76, pulse 73, temperature 98.3 F (36.8 C), temperature source Oral, resp. rate 16, height 5\' 3"  (1.6 m), weight 134 lb (60.8 kg), SpO2 96 %.  GENERAL:  82 y.o.-year-old patient lying in the bed with no acute distress.  EYES: Pupils equal, round, reactive to light and accommodation. No scleral icterus. Extraocular muscles intact.  HEENT: Head atraumatic, normocephalic. Oropharynx and nasopharynx clear.  NECK:  Supple, no jugular venous distention. No thyroid enlargement, no tenderness.  LUNGS: Normal breath sounds bilaterally, no wheezing, rales,rhonchi or crepitation. No use of accessory muscles of respiration.  CARDIOVASCULAR: S1, S2 normal. No murmurs, rubs, or gallops.  ABDOMEN: Soft, nontender, nondistended. Bowel sounds present. No organomegaly or mass.  EXTREMITIES: No pedal edema, cyanosis, or clubbing.  NEUROLOGIC: Cranial nerves II through XII are  intact. Muscle strength 5/5 in all extremities. Sensation intact. Gait not checked.  PSYCHIATRIC: The patient is alert and oriented x 3.  SKIN: No obvious rash, lesion, or ulcer.   Data Review     CBC w Diff:  Lab Results  Component Value Date   WBC 5.2 07/12/2017   HGB 12.7 07/12/2017   HCT 37.6 07/12/2017   PLT 133 (L) 07/12/2017   LYMPHOPCT 17 07/11/2017   MONOPCT 8 07/11/2017   EOSPCT 1 07/11/2017   BASOPCT 0 07/11/2017   CMP:  Lab Results  Component Value Date   NA 140 07/12/2017   K 4.1 07/12/2017   CL 108 07/12/2017   CO2 25 07/12/2017   BUN 16 07/12/2017   CREATININE 0.80 07/12/2017   PROT 6.8 07/11/2017   ALBUMIN 4.5 07/11/2017   BILITOT 0.9 07/11/2017   ALKPHOS 60 07/11/2017   AST 26 07/11/2017   ALT 16 07/11/2017  .  Micro Results No results found for this or any previous visit (from the past 240 hour(s)).      Code Status Orders  (From admission, onward)        Start     Ordered   07/11/17 1353  Do not attempt resuscitation (DNR)  Continuous    Question Answer Comment  In the event of cardiac or respiratory ARREST Do not call a "code blue"   In the event of cardiac or respiratory ARREST Do not perform Intubation, CPR, defibrillation or ACLS   In the event of cardiac or respiratory ARREST Use medication by any route, position, wound care, and other measures to relive pain and suffering. May use oxygen, suction and manual treatment of airway obstruction as needed for comfort.   Comments pt confirms      07/11/17 1353    Code Status History    Date Active Date Inactive Code Status Order ID Comments User Context   06/20/2017 15:32 06/28/2017 13:19 Full Code [id number]  Lynnae Prude Inpatient   06/20/2017 15:32 06/20/2017 15:32 Full Code [id number]  Angiulli, Mcarthur Rossetti, PA-C Inpatient  06/18/2017 20:34 06/20/2017 15:07 Full Code [id number]  Caryl Pina, MD Inpatient   06/15/2017 22:04 06/18/2017 20:34 DNR [id number]  Caryl Pina, MD ED    Advance  Directive Documentation     Most Recent Value  Type of Advance Directive  Living will  Pre-existing out of facility DNR order (yellow form or pink MOST form)  No data  "MOST" Form in Place?  No data          Follow-up Information    Leotis Shames, MD Follow up in 1 week(s).   Specialty:  Internal Medicine Why:  hosp f/u Contact information: [address] [facility] [place] [address] [phone]           Discharge Medications   Allergies as of 07/12/2017   No Known Allergies     Medication List    TAKE these medications   aspirin EC 81 MG tablet Take 1 tablet (81 mg total) by mouth daily. What changed:    medication strength  how much to take   atorvastatin 20 MG tablet Commonly known as:  LIPITOR Take 1 tablet (20 mg total) by mouth daily at 6 PM.          Total Time in preparing paper work, data evaluation and todays exam - 35 minutes  Auburn Bilberry M.D on 07/12/2017 at 1:07 PM  [facility]   Office  [phone]

## 2017-07-13 ENCOUNTER — Ambulatory Visit: Payer: Medicare Other | Admitting: Physical Therapy

## 2017-07-13 ENCOUNTER — Ambulatory Visit: Payer: Medicare Other

## 2017-07-14 ENCOUNTER — Telehealth: Payer: Self-pay

## 2017-07-14 NOTE — Telephone Encounter (Signed)
error 

## 2017-07-17 ENCOUNTER — Telehealth: Payer: Self-pay | Admitting: *Deleted

## 2017-07-17 ENCOUNTER — Ambulatory Visit: Payer: Medicare Other

## 2017-07-17 NOTE — Telephone Encounter (Signed)
[name] daughter of [name] called and left message that her mother woke up Tuesday feeling "weird" and they ended up taking her to the ED where she was admitted overnight and found to have some more bleeding on the brain but was discharged Wednesday.  She was saying that a note is required from MD before she can resume her therapy at [facility] that was ordered by Jesusita Okaan.  I called her back this morning and told her that Dr Wynn BankerKirsteins would need to see her to make assessment and she has appt tomorrow 07/18/17 for her transitional care visit after discharge 06/20/17 from inpt rehab.and she should hold off therapy until then.  Dr Wynn BankerKirsteins please advise if something should be revised.

## 2017-07-18 ENCOUNTER — Encounter: Payer: Medicare Other | Attending: Physical Medicine & Rehabilitation

## 2017-07-18 ENCOUNTER — Encounter: Payer: Self-pay | Admitting: Physical Medicine & Rehabilitation

## 2017-07-18 ENCOUNTER — Ambulatory Visit: Payer: Medicare Other | Admitting: Speech Pathology

## 2017-07-18 ENCOUNTER — Ambulatory Visit (HOSPITAL_BASED_OUTPATIENT_CLINIC_OR_DEPARTMENT_OTHER): Payer: Medicare Other | Admitting: Physical Medicine & Rehabilitation

## 2017-07-18 VITALS — BP 104/67 | HR 80 | Resp 14

## 2017-07-18 DIAGNOSIS — I6350 Cerebral infarction due to unspecified occlusion or stenosis of unspecified cerebral artery: Secondary | ICD-10-CM

## 2017-07-18 DIAGNOSIS — I69319 Unspecified symptoms and signs involving cognitive functions following cerebral infarction: Secondary | ICD-10-CM | POA: Diagnosis not present

## 2017-07-18 DIAGNOSIS — I69314 Frontal lobe and executive function deficit following cerebral infarction: Secondary | ICD-10-CM | POA: Diagnosis present

## 2017-07-18 DIAGNOSIS — I6329 Cerebral infarction due to unspecified occlusion or stenosis of other precerebral arteries: Secondary | ICD-10-CM | POA: Diagnosis not present

## 2017-07-18 DIAGNOSIS — I69398 Other sequelae of cerebral infarction: Secondary | ICD-10-CM

## 2017-07-18 DIAGNOSIS — I69312 Visuospatial deficit and spatial neglect following cerebral infarction: Secondary | ICD-10-CM | POA: Insufficient documentation

## 2017-07-18 DIAGNOSIS — Z5189 Encounter for other specified aftercare: Secondary | ICD-10-CM | POA: Insufficient documentation

## 2017-07-18 DIAGNOSIS — R269 Unspecified abnormalities of gait and mobility: Secondary | ICD-10-CM

## 2017-07-18 DIAGNOSIS — I69359 Hemiplegia and hemiparesis following cerebral infarction affecting unspecified side: Secondary | ICD-10-CM | POA: Diagnosis not present

## 2017-07-18 NOTE — Progress Notes (Signed)
Subjective:    Patient ID: [name], female    DOB: 12/05/1935, 82 y.o.   MRN: [id number] 82 year old right-handed female with unremarkable past medical history, on no prescription medications, lives alone, independent prior to admission.  Family in the area checks on her as needed.  Presented on June 15, 2017, to [facility] with sudden onset of dizziness and right-sided weakness, slurred speech, and blurred vision as well as a fall.  Cranial CT scan negative.  The patient did receive tPA.  CT cerebral perfusion scan as well as CT angiogram of head and neck showed a left P2 segment occlusion.  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 Echocardiogram with ejection fraction of 70%.  No wall motion abnormalities.  Followup CT of the head on June 18, 2017, showed some increased edema and mass effect associated with left PCA infarction and continued to monitor.  Neurology followup, maintained on aspirin for CVA prophylaxis, subcutaneous Lovenox for DVT prophylaxis.  TEE showed ejection fraction of 65%.  Negative bubble study  DATE OF ADMISSION:  06/20/2017 DATE OF DISCHARGE:  06/28/2017  HPI After discharge from inpatient rehab she attended [facility] outpatient therapy for PT OT and speech.  She has residual problems with right-sided visual perceptual neglect, gait imbalance as well as memory impairment. ED visit 1/29 for dizziness and gait imbalance repeat CT showed petechial hemorrhage She returned home without requiring hospitalization.  She remains at a modified independent level for all self-care and mobility.  She does not use an assistive device. She has been on hold from PT OT and speech until MD evaluation. Patient's daughter is with the patient today and has no concerns  Patient has difficulty relating what type of symptoms she had when she went to the ED.  She could not say that she  was weak or dizzy.  She once told her daughter that she had numbness on the right side but then stated that it was not changed Pain Inventory Average Pain 0 Pain Right Now 0 My pain is no pain  In the last 24 hours, has pain interfered with the following? General activity 0 Relation with others 0 Enjoyment of life 0 What TIME of day is your pain at its worst? no pain Sleep (in general) Good  Pain is worse with: no pain Pain improves with: no pain Relief from Meds: no pain  Mobility walk without assistance how many minutes can you walk? no limit ability to climb steps?  yes do you drive?  no Do you have any goals in this area?  yes  Function retired  Neuro/Psych numbness confusion anxiety  Prior Studies hospital f/u  Physicians involved in your care hospital f/u   Family History  Problem Relation Age of Onset  . Heart attack Father   . Breast cancer Sister   . Breast cancer Brother    Social History   Socioeconomic History  . Marital status: Widowed    Spouse name: None  . Number of children: None  . Years of education: None  . Highest education level: None  Social Needs  . Financial resource strain: None  . Food insecurity - worry: None  . Food insecurity - inability: None  . Transportation needs - medical: None  . Transportation needs - non-medical: None  Occupational History  . None  Tobacco Use  . Smoking status: Never Smoker  . Smokeless tobacco: Never Used  . Tobacco comment: None  Substance and Sexual Activity  . Alcohol use: No    Frequency: Never    Comment: None  . Drug use: No    Comment: None  . Sexual activity: No  Other Topics Concern  . None  Social History Narrative  . None   Past Surgical History:  Procedure Laterality Date  . LOOP RECORDER INSERTION N/A 06/20/2017   Procedure: LOOP RECORDER INSERTION;  Surgeon: [name], MD;  [facility] IN[facility] LAB;  Service: Cardiovascular;  Laterality: N/A;  . TEE WITHOUT  CARDIOVERSION N/A 06/19/2017   Procedure: TRANSESOPHAGEAL ECHOCARDIOGRAM (TEE) WITH LOOP;  Surgeon: Jake Bathe, MD;  Location: [facility] ENDOSCOPY;  Service: Cardiovascular;  Laterality: N/A;   Past Medical History:  Diagnosis Date  . Stroke (HCC)    BP 104/67 (BP Location: Left Arm, Patient Position: Sitting, Cuff Size: Normal)   Pulse 80   Resp 14   SpO2 98%   Opioid Risk Score:   Fall Risk Score:  `1  Depression screen PHQ 2/9  No flowsheet data found.  Review of Systems  Constitutional: Negative.   Eyes: Positive for visual disturbance.  Respiratory: Negative.   Cardiovascular: Negative.   Gastrointestinal: Negative.   Endocrine: Negative.   Genitourinary: Negative.   Musculoskeletal: Positive for gait problem.  Skin: Negative.   Allergic/Immunologic: Negative.   Neurological: Positive for numbness.  Hematological: Bruises/bleeds easily.  Psychiatric/Behavioral: The patient is nervous/anxious.        Objective:   Physical Exam  Constitutional: She is oriented to person, place, and time. She appears well-developed and well-nourished. No distress.  HENT:  Head: Normocephalic and atraumatic.  Eyes: Conjunctivae and EOM are normal. Pupils are equal, round, and reactive to light.  Neck: Normal range of motion.  Cardiovascular: Normal rate, regular rhythm and normal heart sounds. Exam reveals no friction rub.  No murmur heard. Pulmonary/Chest: Effort normal and breath sounds normal. No respiratory distress. She has no wheezes.  Abdominal: Soft. Bowel sounds are normal. She exhibits no distension. There is no tenderness.  Neurological: She is alert and oriented to person, place, and time.  Skin: She is not diaphoretic.  Psychiatric: She has a normal mood and affect. Her speech is rapid and/or pressured. Cognition and memory are impaired. She expresses impulsivity. She exhibits abnormal recent memory.  Nursing note and vitals reviewed.  Decreased light touch sensation on  the right arm. Visual confrontation testing, she does not cooperate fully and tends to look to the side with this exam.  No obvious visual field deficit noted. Extraocular muscles are intact there is no evidence of diplopia with lateral gaze Motor strength is 5/5 bilateral deltoid bicep tricep grip hip flexion knee extension ankle dorsiflexion Romberg is positive Tandem gait is impaired Patient is able to walk with a wide-based gait without assistive device.  She has difficulty with toe walking and heel walking.       Assessment & Plan:  1.  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  These are all improving.  She does continue to require outpatient PT OT and speech for the next several weeks and then can progress to home exercise program as well as a community exercise program. I told her that I am not sure that the petechial hemorrhage was not an incidental finding.  She can certainly resume her outpatient PT and OT and speech today. I have told the patient not to resume  her senior sports activities until after she finishes her outpatient therapy will We discussed no driving We discussed follow-up with Dr. Pearlean Brownie and have made referral for this. She will follow-up with her primary care as well.

## 2017-07-18 NOTE — Patient Instructions (Addendum)
No driving  Dr Marlis EdelsonSethi's office will contact you

## 2017-07-19 ENCOUNTER — Ambulatory Visit: Payer: Medicare Other | Admitting: Occupational Therapy

## 2017-07-19 ENCOUNTER — Ambulatory Visit: Payer: Medicare Other | Attending: Physician Assistant

## 2017-07-19 ENCOUNTER — Encounter: Payer: Self-pay | Admitting: Occupational Therapy

## 2017-07-19 DIAGNOSIS — R41841 Cognitive communication deficit: Secondary | ICD-10-CM | POA: Diagnosis present

## 2017-07-19 DIAGNOSIS — R262 Difficulty in walking, not elsewhere classified: Secondary | ICD-10-CM | POA: Diagnosis present

## 2017-07-19 DIAGNOSIS — R278 Other lack of coordination: Secondary | ICD-10-CM | POA: Insufficient documentation

## 2017-07-19 DIAGNOSIS — I6329 Cerebral infarction due to unspecified occlusion or stenosis of other precerebral arteries: Secondary | ICD-10-CM | POA: Insufficient documentation

## 2017-07-19 DIAGNOSIS — M6281 Muscle weakness (generalized): Secondary | ICD-10-CM | POA: Diagnosis present

## 2017-07-19 DIAGNOSIS — H547 Unspecified visual loss: Secondary | ICD-10-CM | POA: Diagnosis present

## 2017-07-19 NOTE — Therapy (Addendum)
[place] [facility] MAIN [facility] SERVICES [address] [place], [place], [address] Phone: [phone]   Fax:  [phone]  Occupational Therapy Treatment  Patient Details  Name: [name] MRN: [id number] Date of Birth: 05-06-1936 Referring Provider: Charlton Amor   Encounter Date: 07/19/2017  OT End of Session - 07/19/17 1459    Visit Number  3    Number of Visits  34    Date for OT Re-Evaluation  09/25/17    OT Start Time  0915    OT Stop Time  1000    OT Time Calculation (min)  45 min    Activity Tolerance  Patient tolerated treatment well    Behavior During Therapy  [facility] for tasks assessed/performed       Past Medical History:  Diagnosis Date  . Stroke [facility])     Past Surgical History:  Procedure Laterality Date  . LOOP RECORDER INSERTION N/A 06/20/2017   Procedure: LOOP RECORDER INSERTION;  Surgeon: [name], MD;  Location: [facility] INVASIVE CV LAB;  Service: Cardiovascular;  Laterality: N/A;  . TEE WITHOUT CARDIOVERSION N/A 06/19/2017   Procedure: TRANSESOPHAGEAL ECHOCARDIOGRAM (TEE) WITH LOOP;  Surgeon: Jake Bathe, MD;  Location: [facility]OS[facility];  Service: Cardiovascular;  Laterality: N/A;    There were no vitals filed for this visit.  Subjective Assessment - 07/19/17 1458    Subjective   Pt. reports she has alot of things to do, and mowing to do.    Patient is accompained by:  Family member    Pertinent History  Pt. is a 82 y.o. 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 Pt. received TPA. Pt. presented to the hospital with dizziness, right sided weakness, slurred speech, and blurred vision. Pt. PMHx includes: Hyperlipidemia, HTN, GERD, and Constipation.     Currently in Pain?  No/denies       OT TREATMENT   Selfcare:  Pt. Worked on visual saccades left to right at 6" and 12" apart at the tabletop. Pt. Had difficulty with the right column at shoulder width apart. Pt. Worked on visual scanning,  and search strategies within her extrapersonal space in the kitchen. Pt. Cues for missed items. Visual compensatory strategies were reviewed with pt. Pt. Worked on visual search strategies using the BiV[employer] Visual Attention assessment. Word search pt. had 2 omissions, and 2 misidentifications in the center completed in 3 min., structured complex circles search with one omission on the right completed in 2 min. Random plain circles search with 100% accuracy completed in 1 min., random circles search with 100% accuracy using symmetrical horizontal left to right search strategies. Pt. Completed the random complex circles search with 8 omissions to the right in 3 min. Pt. Used vertical rectilinear search strategies.                            OT Long Term Goals - 07/19/17 1504      OT LONG TERM GOAL #1   Title  Pt. will independently demonstrate visual scanning and visual search strategies to be able to navigate within her environment during ADL, and IADL tasks.    Baseline  Pt. has difficulty    Time  12    Period  Weeks    Status  New    Target Date  09/25/17      OT LONG TERM GOAL #2   Title  Pt. will  independently demonstrate visual scanning, and visual search strategies to be able to complete paperwork, and forms.    Baseline  Pt. has difficulty    Time  12    Period  Weeks    Status  New    Target Date  09/25/17      OT LONG TERM GOAL #3   Title  Pt. will indepndently demonstrate visual compensatory strategies during ADLs, and IADLs.    Baseline  Pt. has difficulty    Time  12    Period  Weeks    Status  New    Target Date  09/25/17      OT LONG TERM GOAL #4   Title  Pt. will independently demonstrate cognitive compensatory strategies during ADLs, and IADLs.    Baseline  Pt. has difficulty    Time  12    Period  Weeks    Status  New    Target Date  09/25/17      OT LONG TERM GOAL #5   Title  Pt. will increase RUE strength to improve engagement in ADLs,  and IADLs.    Baseline  decreased RUE strength    Time  12    Status  New    Target Date  09/25/17      OT LONG TERM GOAL #6   Title  Pt. will increase right lateral pinch strength by 2# to assist with ADLs.    Baseline  Decreased strength    Time  12    Period  Weeks    Status  New    Target Date  09/25/17            Plan - 07/19/17 1459    Clinical Impression Statement  Pt. was admitted to [facility] last week with a brain bleed. Pt. is now ready to return to therapy. Pt. continues to present with RUE weakness, incoordination, and numbness, and right sided visual impairments. Pt. continues to work on improving RUE functioning for improved ADL, and IADLs. Pt. to continue with the same treatment plan, and goals.     Occupational performance deficits (Please refer to evaluation for details):  ADL's    OT Frequency  2x / week    OT Duration  12 weeks    OT Treatment/Interventions  Self-care/ADL training;Therapeutic exercise;Patient/family education;Cognitive remediation/compensation;Therapeutic activities;Visual/perceptual remediation/compensation    Plan  Assess visual to determine how it may be affecting ADL, and IADL functioning.    Clinical Decision Making  Several treatment options, min-mod task modification necessary    Consulted and Agree with Plan of Care  Patient       Patient will benefit from skilled therapeutic intervention in order to improve the following deficits and impairments:  Decreased strength, Impaired UE functional use, Impaired vision/preception, Decreased cognition, Decreased knowledge of precautions, Decreased safety awareness, Decreased activity tolerance, Decreased coordination  Visit Diagnosis: Muscle weakness (generalized)    Problem List Patient Active Problem List   Diagnosis Date Noted  . Gait disturbance, post-stroke 07/18/2017  . Cerebral hemorrhage (HCC) 07/11/2017  . Cognitive deficit, post-stroke   . Hyperglycemia   . Hypertension   .  Neurologic gait disorder   . Cerebrovascular accident (CVA) due to occlusion of left posterior communicating artery (HCC) 06/20/2017  . Stroke (cerebrum) (HCC) 06/15/2017    Olegario MessierElaine Jediah Horger 07/19/2017, 3:08 PM  [facility] [place] [facility] MAIN [facility] [address] [place], [place], [address] Phone: [phone]   Fax:  [phone]  Name: [name] MRN: [id number] Date of Birth: Jun 10, 1936

## 2017-07-19 NOTE — Therapy (Signed)
[place] [facility] MAIN [facility] SERVICES [address] [place], [place], [address] Phone: [phone]   Fax:  [phone]  Physical Therapy Treatment  Patient Details  Name: [name] MRN: [id number] Date of Birth: 1936-05-16 Referring Provider: Charlton Amor   Encounter Date: 07/19/2017  PT End of Session - 07/19/17 0924    Visit Number  3    Number of Visits  25    Date for PT Re-Evaluation  09/25/17    PT Start Time  0833    PT Stop Time  0914    PT Time Calculation (min)  41 min    Equipment Utilized During Treatment  Gait belt    Activity Tolerance  Patient tolerated treatment well    Behavior During Therapy  [facility] for tasks assessed/performed       Past Medical History:  Diagnosis Date  . Stroke [facility])     Past Surgical History:  Procedure Laterality Date  . LOOP RECORDER INSERTION N/A 06/20/2017   Procedure: LOOP RECORDER INSERTION;  Surgeon: [name], MD;  [facility] LAB;  Service: Cardiovascular;  Laterality: N/A;  . TEE WITHOUT CARDIOVERSION N/A 06/19/2017   Procedure: TRANSESOPHAGEAL ECHOCARDIOGRAM (TEE) WITH LOOP;  Surgeon: Jake Bathe, MD;  Location: [facility] ENDOS[facility];  Service: Cardiovascular;  Laterality: N/A;    There were no vitals filed for this visit.  Subjective Assessment - 07/19/17 0923    Subjective  Patient reports no pain since discharged from hospital. Was in the hospital overnight for a brain bleed. No falls since 3rd of January, initial hospitalization visit. Patient reports walking and doing exercises since hospital. Reports sight is getting better. Still numb on R side. Wants to compete in senior games in April : involves riding bike,cornhole, shuffleboard, pickleball, table tennis, boche, horse shoes.     Pertinent History  Patient was to [place] Jan 3 for CVA, then she went to in patient rehab for a week and came home Jan 16th. She was discharged from inpatient rehab without AD.      Limitations  Standing;Walking    How long can you stand comfortably?  she can stand as long as she wants to.    How long can you walk comfortably?  She walks as far as she wants to walk    Patient Stated Goals  Patients goal is to be able to stay alone at home and not have her daughters live with her.     Currently in Pain?  No/denies          Sensation: normal Strength   Right Left  Hip flexion 4/5 4/5  Hip Abduction 4/5 4/5  Hip Adduction 4-/5 4-/5  Knee Extension  4/5 4/5  Knee Flexion 4/5 4/5  DF 4/5 4/5  PF 4/5 4/5      5x STS= 15.9 seconds BERG= 46/56   TUG=13 seconds  6 min walk test= 1128 ft (1527 age norm) 47 MWT= 8 seconds [facility] PT Assessment - 07/19/17 0001      Standardized Balance Assessment   Standardized Balance Assessment  Berg Balance Test      Berg Balance Test   Sit to Stand  Able to stand without using hands and stabilize independently    Standing Unsupported  Able to stand safely 2 minutes    Sitting with Back Unsupported but Feet Supported on Floor or Stool  Able to sit safely and securely 2 minutes    Stand to Sit  Sits safely with minimal use of hands    Transfers  Able to transfer safely, minor use of hands    Standing Unsupported with Eyes Closed  Able to stand 10 seconds safely    Standing Ubsupported with Feet Together  Able to place feet together independently but unable to hold for 30 seconds    From Standing, Reach Forward with Outstretched Arm  Can reach forward >12 cm safely (5")    From Standing Position, Pick up Object from Floor  Able to pick up shoe safely and easily    From Standing Position, Turn to Look Behind Over each Shoulder  Looks behind from both sides and weight shifts well    Turn 360 Degrees  Able to turn 360 degrees safely but slowly    Standing Unsupported, Alternately Place Feet on Step/Stool  Able to stand independently and complete 8 steps >20 seconds    Standing Unsupported, One Foot in Front  Able to take small  step independently and hold 30 seconds    Standing on One Leg  Able to lift leg independently and hold equal to or more than 3 seconds    Total Score  46         Static Standing Balance  Normal Able to maintain standing balance against maximal resistance   Good Able to maintain standing balance against moderate resistance x  Good-/Fair+ Able to maintain standing balance against minimal resistance   Fair Able to stand unsupported without UE support and without LOB for 1-2 min   Fair- Requires Min A and UE support to maintain standing without loss of balance   Poor+ Requires mod A and UE support to maintain standing without loss of balance   Poor Requires max A and UE support to maintain standing balance without loss    Standing Dynamic Balance  Normal Stand independently unsupported, able to weight shift and cross midline maximally   Good Stand independently unsupported, able to weight shift and cross midline moderately   Good-/Fair+ Stand independently unsupported, able to weight shift across midline minimally x  Fair Stand independently unsupported, weight shift, and reach ipsilaterally, loss of balance when crossing midline   Poor+ Able to stand with Min A and reach ipsilaterally, unable to weight shift   Poor Able to stand with Mod A and minimally reach ipsilaterally, unable to cross midline.                       PT Education - 07/19/17 [phone]    Education provided  Yes    Education Details  POC, continuation of care    Person(s) Educated  Patient    Methods  Explanation;Demonstration;Verbal cues    Comprehension  Returned demonstration;Verbalized understanding;Verbal cues required       PT Short Term Goals - 07/19/17 1245      PT SHORT TERM GOAL #1   Title  Patient will be independent in home exercise program to improve strength/mobility for better functional independence with ADLs.    Time  4    Period  Weeks    Status  New      PT SHORT TERM GOAL #2    Title  Patient (> 12 years old) will complete five times sit to stand test in < 15 seconds indicating an increased LE strength and improved balance.    Baseline  2/6: 15.9 seconds    Time  4    Period  Weeks    Status  New        PT Long Term Goals - 07/19/17 1245      PT LONG TERM GOAL #1   Title  Patient will increase Berg Balance score by > 6 points (52/56)  to demonstrate decreased fall risk during functional activities.    Baseline  2/6: 46/56    Time  12    Period  Weeks    Status  New    Target Date  09/25/17      PT LONG TERM GOAL #2   Title  Patient will be independent with ascend/descend 12 steps using single UE in step over step pattern without LOB.    Baseline  unsteady with single limb stance    Time  12    Period  Weeks    Status  New    Target Date  09/25/17      PT LONG TERM GOAL #3   Title  Patient will participate in the Senior Games to allow for return to prior level of function.     Baseline  Patient not able to perform standing balance required of senior games     Time  12    Period  Weeks    Status  New    Target Date  09/25/17      PT LONG TERM GOAL #4   Title  Patient will increase six minute walk test distance to >1500 for progression to age norm ambulator and improve gait ability    Baseline  2/6: 1128     Time  12    Period  Weeks    Status  New    Target Date  09/25/17            Plan - 07/19/17 1244    Clinical Impression Statement  Patient presents for re-eval after recent discharge from hospital. Was seen prior to overnight stay at hospital by this clinic and given verbal clearance to return to treatment. Sensation normal at this time. 5x STS= 15.9 seconds (at risk for falls), BERG 46/56 (at risk for falls) TUG 13 seconds (functional), 6 min walk test 1128 ft (1527 is age and gender norm). 10 MWT <10 seconds results in safe community ambulator. Patient challenged in standing dynamic balance and by single limb stance at this time.  Patient will continue to benefit from skilled physical therapy to improve strength, dynamic standing balance, and increase gait speed to reduce risk for falls.     Rehab Potential  Good    PT Frequency  2x / week    PT Duration  12 weeks    PT Treatment/Interventions  Gait training;Therapeutic exercise;Therapeutic activities;Functional mobility training;Stair training;Balance training;Neuromuscular re-education;Patient/family education;Manual techniques    PT Next Visit Plan  single limb stance, dynamic balance    Consulted and Agree with Plan of Care  Patient       Patient will benefit from skilled therapeutic intervention in order to improve the following deficits and impairments:  Abnormal gait, Decreased balance, Decreased endurance, Decreased mobility, Difficulty walking, Impaired sensation, Decreased strength, Decreased safety awareness, Decreased coordination, Decreased activity tolerance  Visit Diagnosis: Muscle weakness (generalized)  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 Gait disturbance, post-stroke 07/18/2017  . Cerebral hemorrhage (HCC) 07/11/2017  . Cognitive deficit, post-stroke   . Hyperglycemia   .  Hypertension   . Neurologic gait disorder   . Cerebrovascular accident (CVA) due to occlusion of left posterior communicating artery (HCC) 06/20/2017  . Stroke (cerebrum) (HCC) 06/15/2017   Precious Bard, PT, DPT   Precious Bard 07/19/2017, 12:55 PM  [place] [facility] MAIN [facility] SERVICES [address] [place], [place], [address] Phone: [phone]   Fax:  [phone]  Name: [name] MRN: [id number] Date of Birth: July 28, 1935

## 2017-07-20 ENCOUNTER — Ambulatory Visit (INDEPENDENT_AMBULATORY_CARE_PROVIDER_SITE_OTHER): Payer: Medicare Other | Admitting: *Deleted

## 2017-07-20 DIAGNOSIS — I6329 Cerebral infarction due to unspecified occlusion or stenosis of other precerebral arteries: Secondary | ICD-10-CM

## 2017-07-20 NOTE — Progress Notes (Signed)
[facility] Summary Report / Loop Recorder 

## 2017-07-24 ENCOUNTER — Ambulatory Visit: Payer: Medicare Other | Admitting: Physical Therapy

## 2017-07-24 ENCOUNTER — Ambulatory Visit: Payer: Medicare Other | Admitting: Occupational Therapy

## 2017-07-24 ENCOUNTER — Encounter: Payer: Self-pay | Admitting: Occupational Therapy

## 2017-07-24 ENCOUNTER — Encounter: Payer: Self-pay | Admitting: Physical Therapy

## 2017-07-24 DIAGNOSIS — R262 Difficulty in walking, not elsewhere classified: Secondary | ICD-10-CM

## 2017-07-24 DIAGNOSIS — H547 Unspecified visual loss: Secondary | ICD-10-CM

## 2017-07-24 DIAGNOSIS — R278 Other lack of coordination: Secondary | ICD-10-CM

## 2017-07-24 DIAGNOSIS — I6329 Cerebral infarction due to unspecified occlusion or stenosis of other precerebral arteries: Secondary | ICD-10-CM

## 2017-07-24 DIAGNOSIS — M6281 Muscle weakness (generalized): Secondary | ICD-10-CM | POA: Diagnosis not present

## 2017-07-24 NOTE — Therapy (Signed)
[place] [facility] MAIN [facility] SERVICES [address] [place], [place], [address] Phone: [phone]   Fax:  [phone]  Physical Therapy Treatment  Patient Details  Name: [name] MRN: [id number] Date of Birth: 01-18-1936 Referring Provider: Charlton Amor   Encounter Date: 07/24/2017  PT End of Session - 07/24/17 0920    Visit Number  4    Number of Visits  25    Date for PT Re-Evaluation  09/25/17    PT Start Time  0917    PT Stop Time  1000    PT Time Calculation (min)  43 min    Equipment Utilized During Treatment  Gait belt    Activity Tolerance  Patient tolerated treatment well    Behavior During Therapy  [facility] for tasks assessed/performed       Past Medical History:  Diagnosis Date  . Stroke [facility])     Past Surgical History:  Procedure Laterality Date  . LOOP RECORDER INSERTION N/A 06/20/2017   Procedure: LOOP RECORDER INSERTION;  Surgeon: [name], MD;  Location: [facility] INVASIVE CV LAB;  Service: Cardiovascular;  Laterality: N/A;  . TEE WITHOUT CARDIOVERSION N/A 06/19/2017   Procedure: TRANSESOPHAGEAL ECHOCARDIOGRAM (TEE) WITH LOOP;  Surgeon: Jake Bathe, MD;  Location: [facility] ENDOSCOPY;  Service: Cardiovascular;  Laterality: N/A;    There were no vitals filed for this visit.  Subjective Assessment - 07/24/17 0919    Subjective  Patient reports that she is doing well except now she scratches herself and bleeds. She would like to stop taking the asprin.     Currently in Pain?  No/denies    Pain Score  0-No pain    Multiple Pain Sites  No       Therapeutic exercise and neuromuscular training: 1/2 foam flat side up and balance with head turns left and right feet apart and feet together,cues for better posture Side stepping on blue balance beam left and right x 10 lengths, cues for going slowly and she needs UE support with LOB backwards  tandem standing on 1/2 foam  , cues for better posture  standing hip abd with YTB x 20  ,  cues to keep her shoulders upright  side stepping left and right YTB in parallel bars 10 feet x 3 step ups from floor to 6 inch stool x 20 bilateral marching in parallel bars x 20, cues for bigger steps Tilt board fwd/bwd, side to side left and right, needs UE support Stepping over bolster left and right and fwd/bwd, needs UE support with LOB backwards Tm walking 1. 5 m/hour x 5 mins, cues for long steps and standing posture correction                        PT Education - 07/24/17 0919    Education provided  Yes    Education Details  ambulation    Person(s) Educated  Patient    Methods  Explanation    Comprehension  Verbalized understanding       PT Short Term Goals - 07/19/17 1245      PT SHORT TERM GOAL #1   Title  Patient will be independent in home exercise program to improve strength/mobility for better functional independence with ADLs.    Time  4    Period  Weeks    Status  New      PT SHORT TERM GOAL #2   Title  Patient (> 60  years old) will complete five times sit to stand test in < 15 seconds indicating an increased LE strength and improved balance.    Baseline  2/6: 15.9 seconds    Time  4    Period  Weeks    Status  New        PT Long Term Goals - 07/19/17 1245      PT LONG TERM GOAL #1   Title  Patient will increase Berg Balance score by > 6 points (52/56)  to demonstrate decreased fall risk during functional activities.    Baseline  2/6: 46/56    Time  12    Period  Weeks    Status  New    Target Date  09/25/17      PT LONG TERM GOAL #2   Title  Patient will be independent with ascend/descend 12 steps using single UE in step over step pattern without LOB.    Baseline  unsteady with single limb stance    Time  12    Period  Weeks    Status  New    Target Date  09/25/17      PT LONG TERM GOAL #3   Title  Patient will participate in the Senior Games to allow for return to prior level of function.     Baseline  Patient not able  to perform standing balance required of senior games     Time  12    Period  Weeks    Status  New    Target Date  09/25/17      PT LONG TERM GOAL #4   Title  Patient will increase six minute walk test distance to >1500 for progression to age norm ambulator and improve gait ability    Baseline  2/6: 1128     Time  12    Period  Weeks    Status  New    Target Date  09/25/17            Plan - 07/24/17 0924    Clinical Impression Statement  outside normal BOS.  Pt was able to perform all exercises on uneven surfaces with minimal LOB noted.  Single limb stability activities were performed today, with decrease control noted when attempting to perform tasks with the R > L.  Pt would continue to benefit from skilled therapy services to address further balance impairments and decrease falls risk.    Rehab Potential  Good    PT Frequency  2x / week    PT Duration  12 weeks    PT Treatment/Interventions  Gait training;Therapeutic exercise;Therapeutic activities;Functional mobility training;Stair training;Balance training;Neuromuscular re-education;Patient/family education;Manual techniques    PT Next Visit Plan  single limb stance, dynamic balance    Consulted and Agree with Plan of Care  Patient       Patient will benefit from skilled therapeutic intervention in order to improve the following deficits and impairments:  Abnormal gait, Decreased balance, Decreased endurance, Decreased mobility, Difficulty walking, Impaired sensation, Decreased strength, Decreased safety awareness, Decreased coordination, Decreased activity tolerance  Visit Diagnosis: Vision impairment  Muscle weakness (generalized)  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 Gait disturbance, post-stroke 07/18/2017   . Cerebral hemorrhage (HCC) 07/11/2017  . Cognitive deficit, post-stroke   . Hyperglycemia   .  Hypertension   . Neurologic gait disorder   . Cerebrovascular accident (CVA) due to occlusion of left posterior communicating artery (HCC) 06/20/2017  . Stroke (cerebrum) (HCC) 06/15/2017    Ezekiel Ina , PT DPT 07/24/2017, 9:26 AM  [facility] MAIN [facility] SERVICES [address] [place], [place], [address] Phone: [phone]   Fax:  [phone]  Name: [name] MRN: [id number] Date of Birth: 1936-04-16

## 2017-07-24 NOTE — Therapy (Signed)
[facility] [address] [place], [place], [address] Phone: [phone]   Fax:  [phone]  Occupational Therapy Treatment  Patient Details  Name: Angela SheererReva R [facility] MRN: [id number] Date of Birth: 12/22/1935 Referring Provider: Charlton AmorANGIULLI, DANIEL [facility]   Encounter Date: 07/24/2017  OT End of Session - 07/24/17 0834    Visit Number  4    Number of Visits  34    Date for OT Re-Evaluation  09/25/17    OT Start Time  0830    OT Stop Time  0915    OT Time Calculation (min)  45 min    Activity Tolerance  Patient tolerated treatment well    Behavior During Therapy  [facility] for tasks assessed/performed       Past Medical History:  Diagnosis Date  . Stroke [facility](HCC)     Past Surgical History:  Procedure Laterality Date  . LOOP RECORDER INSERTION N/A 06/20/2017   Procedure: LOOP RECORDER INSERTION;  Surgeon: Hillis RangeAllred, James, [facility];  Location: [facility] INVASIVE CV LAB;  Service: Cardiovascular;  Laterality: N/A;  . TEE WITHOUT CARDIOVERSION N/A 06/19/2017   Procedure: TRANSESOPHAGEAL ECHOCARDIOGRAM (TEE) WITH LOOP;  Surgeon: [name];  Location: [facility] ENDOSCOPY;  Service: Cardiovascular;  Laterality: N/A;    There were no vitals filed for this visit.  Subjective Assessment - 07/24/17 0833    Subjective   Pt reports having a quick weekend    Patient is accompained by:  Family member    Pertinent History  Pt. is a 82 y.o. 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 Pt. received TPA. Pt. presented to the [facility] with dizziness, right sided weakness, slurred speech, and blurred vision. Pt. PMHx includes: Hyperlipidemia, HTN, GERD, and Constipation.     Currently in Pain?  No/denies       OT TREATMENT    Selfcare:  Pt. worked on visual saccades left to right at 6", and 12" apart at the tabletop.Pt. with one omission at 6", multiple at 12, and pt. Had a lot of difficulty at 18" apart. Pt. Worked with the  4", and 6"  apart vertically wit visual h 63M. Pt. Worked on visual scanning, and visual search strategies with 63M for letter and number crossouts at the tabletop. Pt. With multiple omissions throughout the page. Pt. Education was provided about visual compensatory strategies with reading newspaper headline print. Pt. Has difficulty reading the headlines. Pt. Requires cues, and assist using the visual compensatory strategies.                            OT Long Term Goals - 07/19/17 1504      OT LONG TERM GOAL #1   Title  Pt. will independently demonstrate visual scanning and visual search strategies to be able to navigate within her environment during ADL, and IADL tasks.    Baseline  Pt. has difficulty    Time  12    Period  Weeks    Status  New    Target Date  09/25/17      OT LONG TERM GOAL #2   Title  Pt. will independently demonstrate visual scanning, and visual search strategies to be able to complete paperwork, and forms.    Baseline  Pt. has difficulty    Time  12    Period  Weeks    Status  New    Target  Date  09/25/17      OT LONG TERM GOAL #3   Title  Pt. will indepndently demonstrate visual compensatory strategies during ADLs, and IADLs.    Baseline  Pt. has difficulty    Time  12    Period  Weeks    Status  New    Target Date  09/25/17      OT LONG TERM GOAL #4   Title  Pt. will independently demonstrate cognitive compensatory strategies during ADLs, and IADLs.    Baseline  Pt. has difficulty    Time  12    Period  Weeks    Status  New    Target Date  09/25/17      OT LONG TERM GOAL #5   Title  Pt. will increase RUE strength to improve engagement in ADLs, and IADLs.    Baseline  decreased RUE strength    Time  12    Status  New    Target Date  09/25/17      OT LONG TERM GOAL #6   Title  Pt. will increase right lateral pinch strength by 2# to assist with ADLs.    Baseline  Decreased strength    Time  12    Period  Weeks    Status  New     Target Date  09/25/17            Plan - 07/24/17 0834    Clinical Impression Statement  Pt. reports that her daughter returned to [place] [facility]. Pt. is staying by herself now. pt. reports her sisters, and niece check in with her. Pt. reports it is great. she is able to go to bed when she wants  to. Pt. reports she went to the grocery store, and she made dinner for a friend, and ordered a pizza. Pt. is still not driving. Pt. reports she is is unable to read.  pt. is anxious to get back in the senior games.    Occupational performance deficits (Please refer to evaluation for details):  ADL's    OT Frequency  2x / week    OT Duration  12 weeks    OT Treatment/Interventions  Self-care/ADL training;Therapeutic exercise;Patient/family education;Cognitive remediation/compensation;Therapeutic activities;Visual/perceptual remediation/compensation    Clinical Decision Making  Several treatment options, min-mod task modification necessary    Consulted and Agree with Plan of Care  Patient       Patient will benefit from skilled therapeutic intervention in order to improve the following deficits and impairments:  Decreased strength, Impaired UE functional use, Impaired vision/preception, Decreased cognition, Decreased knowledge of precautions, Decreased safety awareness, Decreased activity tolerance, Decreased coordination  Visit Diagnosis: Vision impairment    Problem List Patient Active Problem List   Diagnosis Date Noted  . Gait disturbance, post-stroke 07/18/2017  . Cerebral hemorrhage (HCC) 07/11/2017  . Cognitive deficit, post-stroke   . Hyperglycemia   . Hypertension   . Neurologic gait disorder   . Cerebrovascular accident (CVA) due to occlusion of left posterior communicating artery (HCC) 06/20/2017  . Stroke (cerebrum) (HCC) 06/15/2017    Angela Messier, Ms, OTR/L 07/24/2017, 8:39 AM  [facility] MAIN [facility] SERVICES [address]  [place], [place], [address] Phone: [phone]   Fax:  [phone]  Name: [name] MRN: [id number] Date of Birth: 01/11/1936

## 2017-07-25 ENCOUNTER — Ambulatory Visit: Payer: Medicare Other | Admitting: Speech Pathology

## 2017-07-25 ENCOUNTER — Encounter: Payer: Self-pay | Admitting: Speech Pathology

## 2017-07-25 DIAGNOSIS — M6281 Muscle weakness (generalized): Secondary | ICD-10-CM | POA: Diagnosis not present

## 2017-07-25 DIAGNOSIS — R41841 Cognitive communication deficit: Secondary | ICD-10-CM

## 2017-07-25 NOTE — Therapy (Signed)
[facility] [address] [place], [place], [address] Phone: [phone]   Fax:  [phone]  Speech Language Pathology Evaluation  Patient Details  Name: [name] MRN: [id number] Date of Birth: 04/06/1936 Referring Provider: Dr. Lina SayreP Sethi   Encounter Date: 07/25/2017  End of Session - 07/25/17 1628    Visit Number  1    Number of Visits  8    Date for SLP Re-Evaluation  09/19/17    SLP Start Time  1100    SLP Stop Time   1200    SLP Time Calculation (min)  60 min    Activity Tolerance  Patient tolerated treatment well       Past Medical History:  Diagnosis Date  . Stroke [facility](HCC)     Past Surgical History:  Procedure Laterality Date  . LOOP RECORDER INSERTION N/A 06/20/2017   Procedure: LOOP RECORDER INSERTION;  Surgeon: Hillis RangeAllred, James, MD;  [facility] INVASIVE CV LAB;  Service: Cardiovascular;  Laterality: N/A;  . TEE WITHOUT CARDIOVERSION N/A 06/19/2017   Procedure: TRANSESOPHAGEAL ECHOCARDIOGRAM (TEE) WITH LOOP;  Surgeon: Jake BatheSkains, Mark C, MD;  Location: [facility] ENDOSCOPY;  Service: Cardiovascular;  Laterality: N/A;    There were no vitals filed for this visit.  Subjective Assessment - 07/25/17 0001    Subjective  I am here because they told me I had to. I don't want to be here.         SLP Evaluation [facility] - 07/25/17 0001      SLP Visit Information   SLP Received On  07/25/17    Referring Provider  Dr. Lina SayreP Sethi    Onset Date  06/21/2017    Medical Diagnosis  L PCA infarct 06/15/17      Pain Assessment   Currently in Pain?  No/denies      General Information   HPI  82 year old female referred for outpatient speech therapy evaluation following hospitalization for L PCA infarct    Behavioral/Cognition  Pt awake and alert, pleasant and cooperative    Mobility Status  ambulates independently      Prior Functional Status   Cognitive/Linguistic Baseline  Within functional limits    Type of Home  House     Lives  With  Alone    Education  high school, some community college classes    Vocation  Retired      IT consultantCognition   Overall Cognitive Status  Impaired/Different from baseline    Area of Impairment  Attention;Memory;Safety/judgement;Problem solving    Current Attention Level  Sustained    Attention  Focused;Sustained;Selective    Focused Attention  Appears intact    Sustained Attention  Appears intact    Selective Attention  Impaired    Selective Attention Impairment  Verbal basic    Memory  Impaired    Memory Impairment  Storage deficit;Retrieval deficit;Decreased recall of new information;Decreased short term memory    Decreased Short Term Memory  Verbal basic    Awareness  Impaired    Awareness Impairment  Intellectual impairment;Emergent impairment;Anticipatory impairment    Problem Solving  Impaired    Problem Solving Impairment  Verbal basic    Executive Function  Reasoning;Organizing;Decision Making    Reasoning  Impaired    Reasoning Impairment  Verbal basic    Organizing  Impaired    Organizing Impairment  Verbal basic    Decision Making  Impaired    Decision Making Impairment  Verbal basic  Auditory Comprehension   Overall Auditory Comprehension  Appears within functional limits for tasks assessed      Expression   Primary Mode of Expression  Verbal      Verbal Expression   Overall Verbal Expression  Appears within functional limits for tasks assessed      Written Expression   Dominant Hand  Right      Oral Motor/Sensory Function   Overall Oral Motor/Sensory Function  Appears within functional limits for tasks assessed      Motor Speech   Overall Motor Speech  Appears within functional limits for tasks assessed      Standardized Assessments   Standardized Assessments   Cognitive Linguistic Quick Test      Cognitive Linguistic Quick Test (Ages 18-69)   Attention  Mild 164    Memory  Moderate 126    Executive Function  WNL 25 - low normal    Language  Mild 28     Visuospatial Skills  Mild 75    Severity Rating Total  15    Composite Severity Rating  12.6      Individuals Consulted   Consulted and Agree with Results and Recommendations  Patient        SLP Education - 07/25/17 1627    Education provided  Yes    Education Details  results of CLQT, therapy recommendations    Person(s) Educated  Patient    Methods  Explanation;Demonstration    Comprehension  Verbalized understanding;Need further instruction;Verbal cues required         SLP Long Term Goals - 07/25/17 1645      SLP LONG TERM GOAL #1   Title  Pt will demonstrate functional cognitive-communication skills for independent completion of personal responsibilities    Time  8    Period  Weeks    Status  New    Target Date  09/22/17      SLP LONG TERM GOAL #2   Title  Pt will complete complex cognitive-linguistic tasks (thought organization, problem solving, reasoning) with 80% accuracy given min assist    Time  8    Period  Weeks    Status  New    Target Date  09/22/17      SLP LONG TERM GOAL #3   Title  Pt will demonstrate functional use of external memory aids with 80% accuracy.    Time  8    Period  Weeks    Status  New    Target Date  09/22/17       Plan - 07/25/17 1629    Clinical Impression Statement  CLQT results indicate Mild impairment of attention, language, and visuospatial skills, Moderate impairment of Memory. Skilled ST intervention is recommended to maximize cognitive linguistic function for safety and independence, 1x/week for 8 weeks.    Speech Therapy Frequency  1x /week    Duration  -- 8 weeks    Treatment/Interventions  Cognitive reorganization;SLP instruction and feedback;Internal/external aids;Compensatory strategies;Patient/family education;Multimodal communcation approach;Functional tasks    Potential to Achieve Goals  Good    Potential Considerations  Ability to learn/carryover information;Previous level of function;Family/community support     SLP Home Exercise Plan  pt to identify areas where functional improvement is desired    Consulted and Agree with Plan of Care  Patient       Patient will benefit from skilled therapeutic intervention in order to improve the following deficits and impairments:   Cognitive communication deficit - Plan: SLP plan  of care cert/re-cert    Problem List Patient Active Problem List   Diagnosis Date Noted  . Gait disturbance, post-stroke 07/18/2017  . Cerebral hemorrhage (HCC) 07/11/2017  . Cognitive deficit, post-stroke   . Hyperglycemia   . Hypertension   . Neurologic gait disorder   . Cerebrovascular accident (CVA) due to occlusion of left posterior communicating artery (HCC) 06/20/2017  . Stroke (cerebrum) (HCC) 06/15/2017   Marrah Vanevery B. Murvin Natal [facility], CCC-SLP Speech Language Pathologist  Leigh Aurora 07/25/2017, 4:54 PM  [facility] MAIN [facility] SERVICES [address] [place], [place], [address] Phone: [phone]   Fax:  [phone]  Name: [name] MRN: [id number] Date of Birth: 1935-06-30

## 2017-07-27 ENCOUNTER — Encounter: Payer: Self-pay | Admitting: Physical Therapy

## 2017-07-27 ENCOUNTER — Ambulatory Visit: Payer: Medicare Other | Admitting: Physical Therapy

## 2017-07-27 VITALS — BP 157/73 | HR 88

## 2017-07-27 DIAGNOSIS — M6281 Muscle weakness (generalized): Secondary | ICD-10-CM | POA: Diagnosis not present

## 2017-07-27 DIAGNOSIS — R262 Difficulty in walking, not elsewhere classified: Secondary | ICD-10-CM

## 2017-07-27 DIAGNOSIS — R278 Other lack of coordination: Secondary | ICD-10-CM

## 2017-07-27 NOTE — Therapy (Signed)
[facility] [address] [place], [place], [address] Phone: [phone]   Fax:  [phone]  Physical Therapy Treatment  Patient Details  Name: [name] MRN: [id number] Date of Birth: 03/13/1936 Referring Provider: Charlton AmorANGIULLI, DANIEL J   Encounter Date: 07/27/2017  PT End of Session - 07/27/17 1429    Visit Number  5    Number of Visits  25    Date for PT Re-Evaluation  09/25/17    PT Start Time  1431    PT Stop Time  1512    PT Time Calculation (min)  41 min    Equipment Utilized During Treatment  Gait belt    Activity Tolerance  Patient tolerated treatment well    Behavior During Therapy  [facility] for tasks assessed/performed       Past Medical History:  Diagnosis Date  . Stroke [facility](HCC)     Past Surgical History:  Procedure Laterality Date  . LOOP RECORDER INSERTION N/A 06/20/2017   Procedure: LOOP RECORDER INSERTION;  Surgeon: Hillis RangeAllred, James, MD;  Location: [facility] INVASIVE CV LAB;  Service: Cardiovascular;  Laterality: N/A;  . TEE WITHOUT CARDIOVERSION N/A 06/19/2017   Procedure: TRANSESOPHAGEAL ECHOCARDIOGRAM (TEE) WITH LOOP;  Surgeon: Jake BatheSkains, Mark C, MD;  Location: [facility] ENDOSCOPY;  Service: Cardiovascular;  Laterality: N/A;    Vitals:   07/27/17 1438  BP: (!) 157/73  Pulse: 88  SpO2: 98%    Subjective Assessment - 07/27/17 1437    Subjective  Pt with no new complaints or concerns. Pt has been completing her HEP 7 days/wk without and questions or concerns.  Denies no falls since her fall on Jan 3rd.      Currently in Pain?  No/denies    Multiple Pain Sites  No        TREATMENT  Therapeutic exercise and neuromuscular training:  Rhomberg stance on airex with ball toss L x20 and R x20  Rhomberg stance on airex with horizontal and vertical head turns as instructed by PT  Ambulating in hallway with spontaneous changes in directions as instructed by this PT. Forward, back, left, right.  Alternating toe taps from airex  up to 8" step with cues to tap softly for greater control x20 each LE  Spontaneous toe taps to balance stones from airex x20 each LE.  Quick agility steps L and R over tape on floor  Quick agility steps forward and back over tape on floor  Sideways lunges onto bosu ball with round side up x20 each LE                       PT Education - 07/27/17 1428    Education provided  Yes    Education Details  Exercise Technique    Person(s) Educated  Patient    Methods  Explanation;Demonstration;Verbal cues    Comprehension  Verbalized understanding;Returned demonstration;Verbal cues required;Need further instruction       PT Short Term Goals - 07/19/17 1245      PT SHORT TERM GOAL #1   Title  Patient will be independent in home exercise program to improve strength/mobility for better functional independence with ADLs.    Time  4    Period  Weeks    Status  New      PT SHORT TERM GOAL #2   Title  Patient (> 80 years old) will complete five times sit to stand test in < 15 seconds indicating an increased  LE strength and improved balance.    Baseline  2/6: 15.9 seconds    Time  4    Period  Weeks    Status  New        PT Long Term Goals - 07/19/17 1245      PT LONG TERM GOAL #1   Title  Patient will increase Berg Balance score by > 6 points (52/56)  to demonstrate decreased fall risk during functional activities.    Baseline  2/6: 46/56    Time  12    Period  Weeks    Status  New    Target Date  09/25/17      PT LONG TERM GOAL #2   Title  Patient will be independent with ascend/descend 12 steps using single UE in step over step pattern without LOB.    Baseline  unsteady with single limb stance    Time  12    Period  Weeks    Status  New    Target Date  09/25/17      PT LONG TERM GOAL #3   Title  Patient will participate in the Senior Games to allow for return to prior level of function.     Baseline  Patient not able to perform standing balance required of  senior games     Time  12    Period  Weeks    Status  New    Target Date  09/25/17      PT LONG TERM GOAL #4   Title  Patient will increase six minute walk test distance to >1500 for progression to age norm ambulator and improve gait ability    Baseline  2/6: 1128     Time  12    Period  Weeks    Status  New    Target Date  09/25/17            Plan - 07/27/17 1439    Clinical Impression Statement  Pt demonstrates instability when changing direction quickly and thus introduced ambulating in hallway with spontaneous and quick direction changes.  She demonstrates short step length Bil so had pt take bigger steps over hurtles and cones to promote greater step length.  Pt will benefit from continued skilled PT interventions for improved balance.      Rehab Potential  Good    PT Frequency  2x / week    PT Duration  12 weeks    PT Treatment/Interventions  Gait training;Therapeutic exercise;Therapeutic activities;Functional mobility training;Stair training;Balance training;Neuromuscular re-education;Patient/family education;Manual techniques    PT Next Visit Plan  single limb stance, dynamic balance    Consulted and Agree with Plan of Care  Patient       Patient will benefit from skilled therapeutic intervention in order to improve the following deficits and impairments:  Abnormal gait, Decreased balance, Decreased endurance, Decreased mobility, Difficulty walking, Impaired sensation, Decreased strength, Decreased safety awareness, Decreased coordination, Decreased activity tolerance  Visit Diagnosis: Muscle weakness (generalized)  Difficulty in walking, not elsewhere classified  Other lack of coordination     Problem List Patient Active Problem List   Diagnosis Date Noted  . Gait disturbance, post-stroke 07/18/2017  . Cerebral hemorrhage (HCC) 07/11/2017  . Cognitive deficit, post-stroke   . Hyperglycemia   . Hypertension   . Neurologic gait disorder   . 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 Stroke (cerebrum) (HCC) 06/15/2017    Encarnacion Chu PT,  DPT 07/27/2017, 3:12 PM  [facility] MAIN [facility] SERVICES [address] [place], [place], [address] Phone: [phone]   Fax:  [phone]  Name: [name] MRN: [id number] Date of Birth: [birth year]

## 2017-07-31 ENCOUNTER — Ambulatory Visit: Payer: Medicare Other | Admitting: Occupational Therapy

## 2017-07-31 ENCOUNTER — Encounter: Payer: Self-pay | Admitting: Occupational Therapy

## 2017-07-31 DIAGNOSIS — R278 Other lack of coordination: Secondary | ICD-10-CM

## 2017-07-31 DIAGNOSIS — H547 Unspecified visual loss: Secondary | ICD-10-CM

## 2017-07-31 DIAGNOSIS — M6281 Muscle weakness (generalized): Secondary | ICD-10-CM | POA: Diagnosis not present

## 2017-07-31 NOTE — Therapy (Addendum)
[place] [facility] [address] [place], [place], [address] Phone: [phone]   Fax:  [phone]  Occupational Therapy Treatment  Patient Details  Name: [name] MRN: [id number] Date of Birth: 09/26/1935 Referring Provider: Charlton AmorANGIULLI, DANIEL J   Encounter Date: 07/31/2017  OT End of Session - 07/31/17 0928    Visit Number  5    Number of Visits  34    Date for OT Re-Evaluation  09/25/17    OT Start Time  0915    OT Stop Time  1000    OT Time Calculation (min)  45 min    Activity Tolerance  Patient tolerated treatment well    Behavior During Therapy  [facility] for tasks assessed/performed       Past Medical History:  Diagnosis Date  . Stroke [facility](HCC)     Past Surgical History:  Procedure Laterality Date  . LOOP RECORDER INSERTION N/A 06/20/2017   Procedure: LOOP RECORDER INSERTION;  Surgeon: Hillis RangeAllred, James, MD;  Location: [facility] INVASIVE CV LAB;  Service: Cardiovascular;  Laterality: N/A;  . TEE WITHOUT CARDIOVERSION N/A 06/19/2017   Procedure: TRANSESOPHAGEAL ECHOCARDIOGRAM (TEE) WITH LOOP;  Surgeon: Jake BatheSkains, Mark C, MD;  Location: [facility] ENDOSCOPY;  Service: Cardiovascular;  Laterality: N/A;    There were no vitals filed for this visit.  Subjective Assessment - 07/31/17 0927    Subjective   Pt. repports she is getting better.    Patient is accompained by:  Family member    Pertinent History  Pt. is a 82 y.o. 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 Pt. received TPA. Pt. presented to the hospital with dizziness, right sided weakness, slurred speech, and blurred vision. Pt. PMHx includes: Hyperlipidemia, HTN, GERD, and Constipation.     Currently in Pain?  No/denies    Pain Score  0-No pain    Multiple Pain Sites  No       OT TREATMENT    Selfcare:  Pt. Worked on visual scanning, and visual search strategies. Pt. Required cues for visual compensatory, and visual search strategies. Pt. Worked on  navigating through check writing tasks using simulated checks. Pt. Requires visual, and verbal cues. Pt. Required step-by-step cues for navigating a simulated check register.                       OT Education - 07/31/17 [phone]    Education provided  Yes    Education Details  Visual compensatory strategies    Person(s) Educated  Patient    Methods  Explanation;Demonstration;Verbal cues    Comprehension  Verbalized understanding;Returned demonstration;Need further instruction          OT Long Term Goals - 07/19/17 1504      OT LONG TERM GOAL #1   Title  Pt. will independently demonstrate visual scanning and visual search strategies to be able to navigate within her environment during ADL, and IADL tasks.    Baseline  Pt. has difficulty    Time  12    Period  Weeks    Status  New    Target Date  09/25/17      OT LONG TERM GOAL #2   Title  Pt. will independently demonstrate visual scanning, and visual search strategies to be able to complete paperwork, and forms.    Baseline  Pt. has difficulty    Time  12    Period  Weeks  Status  New    Target Date  09/25/17      OT LONG TERM GOAL #3   Title  Pt. will indepndently demonstrate visual compensatory strategies during ADLs, and IADLs.    Baseline  Pt. has difficulty    Time  12    Period  Weeks    Status  New    Target Date  09/25/17      OT LONG TERM GOAL #4   Title  Pt. will independently demonstrate cognitive compensatory strategies during ADLs, and IADLs.    Baseline  Pt. has difficulty    Time  12    Period  Weeks    Status  New    Target Date  09/25/17      OT LONG TERM GOAL #5   Title  Pt. will increase RUE strength to improve engagement in ADLs, and IADLs.    Baseline  decreased RUE strength    Time  12    Status  New    Target Date  09/25/17      OT LONG TERM GOAL #6   Title  Pt. will increase right lateral pinch strength by 2# to assist with ADLs.    Baseline  Decreased strength     Time  12    Period  Weeks    Status  New    Target Date  09/25/17            Plan - 07/31/17 8469    Clinical Impression Statement  Pt. brought in a list of the senior games, and checked off the ones that she felt like she could do. It was recommended that the pt. get clearance from her physician prior the resuming the games.  Pt. reports she is seeing a new doctor this Wednesday. Pt. reports she is still having difficulty reading.    Occupational performance deficits (Please refer to evaluation for details):  ADL's    OT Frequency  2x / week    OT Duration  12 weeks    OT Treatment/Interventions  Self-care/ADL training;Therapeutic exercise;Patient/family education;Cognitive remediation/compensation;Therapeutic activities;Visual/perceptual remediation/compensation    Plan  Assess visual to determine how it may be affecting ADL, and IADL functioning.    Clinical Decision Making  Several treatment options, min-mod task modification necessary    Consulted and Agree with Plan of Care  Patient       Patient will benefit from skilled therapeutic intervention in order to improve the following deficits and impairments:  Decreased strength, Impaired UE functional use, Impaired vision/preception, Decreased cognition, Decreased knowledge of precautions, Decreased safety awareness, Decreased activity tolerance, Decreased coordination  Visit Diagnosis: Other lack of coordination  Vision impairment    Problem List Patient Active Problem List   Diagnosis Date Noted  . Gait disturbance, post-stroke 07/18/2017  . Cerebral hemorrhage (HCC) 07/11/2017  . Cognitive deficit, post-stroke   . Hyperglycemia   . Hypertension   . Neurologic gait disorder   . Cerebrovascular accident (CVA) due to occlusion of left posterior communicating artery (HCC) 06/20/2017  . Stroke (cerebrum) (HCC) 06/15/2017    Olegario Messier, MS, OTR/L 07/31/2017, 9:36 AM  [facility]  MAIN [facility] SERVICES [address] [place], [place], [address] Phone: [phone]   Fax:  [phone]  Name: [name] MRN: [id number] Date of Birth: [birth year]

## 2017-08-01 ENCOUNTER — Encounter: Payer: Self-pay | Admitting: Physical Therapy

## 2017-08-01 ENCOUNTER — Ambulatory Visit: Payer: Medicare Other | Admitting: Physical Therapy

## 2017-08-01 ENCOUNTER — Ambulatory Visit: Payer: Medicare Other | Admitting: Occupational Therapy

## 2017-08-01 ENCOUNTER — Encounter: Payer: Self-pay | Admitting: Occupational Therapy

## 2017-08-01 DIAGNOSIS — R262 Difficulty in walking, not elsewhere classified: Secondary | ICD-10-CM

## 2017-08-01 DIAGNOSIS — R278 Other lack of coordination: Secondary | ICD-10-CM

## 2017-08-01 DIAGNOSIS — H547 Unspecified visual loss: Secondary | ICD-10-CM

## 2017-08-01 DIAGNOSIS — M6281 Muscle weakness (generalized): Secondary | ICD-10-CM | POA: Diagnosis not present

## 2017-08-01 NOTE — Therapy (Signed)
[facility] [address] [place], [place], [address] Phone: [phone]   Fax:  [phone]  Occupational Therapy Treatment  Patient Details  Name: Angela SheererReva R Gilbert MRN: [id number] Date of Birth: 06/03/1936 Referring Provider: Charlton AmorANGIULLI, DANIEL J   Encounter Date: 08/01/2017  OT End of Session - 08/01/17 0842    Visit Number  6    Number of Visits  34    Date for OT Re-Evaluation  09/25/17    OT Start Time  0830    OT Stop Time  0915    OT Time Calculation (min)  45 min    Activity Tolerance  Treatment limited secondary to medical complications (Comment)    Behavior During Therapy  [facility] for tasks assessed/performed       Past Medical History:  Diagnosis Date  . Stroke [facility](HCC)     Past Surgical History:  Procedure Laterality Date  . LOOP RECORDER INSERTION N/A 06/20/2017   Procedure: LOOP RECORDER INSERTION;  Surgeon: Hillis RangeAllred, James, MD;  [facility]: [facility] INVASIVE CV LAB;  Service: Cardiovascular;  Laterality: N/A;  . TEE WITHOUT CARDIOVERSION N/A 06/19/2017   Procedure: TRANSESOPHAGEAL ECHOCARDIOGRAM (TEE) WITH LOOP;  Surgeon: Jake BatheSkains, Mark C, MD;  Location: [facility] ENDOSCOPY;  Service: Cardiovascular;  Laterality: N/A;    There were no vitals filed for this visit.  Subjective Assessment - 08/01/17 0831    Subjective   Pt. reports she is feeling better. Has not had any falls.    Patient is accompained by:  Family member    Pertinent History  Pt. is a 82 y.o. 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 Pt. received TPA. Pt. presented to the hospital with dizziness, right sided weakness, slurred speech, and blurred vision. Pt. PMHx includes: Hyperlipidemia, HTN, GERD, and Constipation.     Currently in Pain?  No/denies      OT TREATMENT   Selfcare:  Pt. Worked on visual scanning, and visual search strategies for tabletop ADL tasks. Pt. Had multiple omissions throughout the page for double number  crossouts. Pt. Required increased time to complete. Pt. Worked on visual saccades left to right, with omissions. Pt. Worked on simulated check writing tasks, including filling out a check register, and subtracting totals.           t.                OT Education - 08/01/17 [id number]    Education provided  Yes    Education Details  Visual compensatory strategies    Person(s) Educated  Patient    Methods  Explanation;Demonstration;Verbal cues    Comprehension  Verbalized understanding;Returned demonstration;Need further instruction          OT Long Term Goals - 07/19/17 1504      OT LONG TERM GOAL #1   Title  Pt. will independently demonstrate visual scanning and visual search strategies to be able to navigate within her environment during ADL, and IADL tasks.    Baseline  Pt. has difficulty    Time  12    Period  Weeks    Status  New    Target Date  09/25/17      OT LONG TERM GOAL #2   Title  Pt. will independently demonstrate visual scanning, and visual search strategies to be able to complete paperwork, and forms.    Baseline  Pt. has difficulty    Time  12    Period  Weeks    Status  New    Target Date  09/25/17      OT LONG TERM GOAL #3   Title  Pt. will indepndently demonstrate visual compensatory strategies during ADLs, and IADLs.    Baseline  Pt. has difficulty    Time  12    Period  Weeks    Status  New    Target Date  09/25/17      OT LONG TERM GOAL #4   Title  Pt. will independently demonstrate cognitive compensatory strategies during ADLs, and IADLs.    Baseline  Pt. has difficulty    Time  12    Period  Weeks    Status  New    Target Date  09/25/17      OT LONG TERM GOAL #5   Title  Pt. will increase RUE strength to improve engagement in ADLs, and IADLs.    Baseline  decreased RUE strength    Time  12    Status  New    Target Date  09/25/17      OT LONG TERM GOAL #6   Title  Pt. will increase right lateral pinch strength by 2# to  assist with ADLs.    Baseline  Decreased strength    Time  12    Period  Weeks    Status  New    Target Date  09/25/17            Plan - 08/01/17 0843    Clinical Impression Statement  Pt. is anxious to return to her senior games. Pt. sees a new physician tomorrow. Pt. continues to work on improving visual scanning, visual search strategies, and visual compensatory strategies for tabletop DAL tasks, and navigating within her environment.    Occupational performance deficits (Please refer to evaluation for details):  ADL's    OT Frequency  2x / week    OT Duration  12 weeks    OT Treatment/Interventions  Self-care/ADL training;Therapeutic exercise;Patient/family education;Cognitive remediation/compensation;Therapeutic activities;Visual/perceptual remediation/compensation    Plan  Assess visual to determine how it may be affecting ADL, and IADL functioning.    Clinical Decision Making  Several treatment options, min-mod task modification necessary    Consulted and Agree with Plan of Care  Patient       Patient will benefit from skilled therapeutic intervention in order to improve the following deficits and impairments:  Decreased strength, Impaired UE functional use, Impaired vision/preception, Decreased cognition, Decreased knowledge of precautions, Decreased safety awareness, Decreased activity tolerance, Decreased coordination  Visit Diagnosis: Other lack of coordination  Vision impairment    Problem List Patient Active Problem List   Diagnosis Date Noted  . Gait disturbance, post-stroke 07/18/2017  . Cerebral hemorrhage (HCC) 07/11/2017  . Cognitive deficit, post-stroke   . Hyperglycemia   . Hypertension   . Neurologic gait disorder   . Cerebrovascular accident (CVA) due to occlusion of left posterior communicating artery (HCC) 06/20/2017  . Stroke (cerebrum) (HCC) 06/15/2017    Olegario Messier 08/01/2017, 8:47 AM  [facility]  MAIN [facility] SERVICES [address] [place], [place], [address] Phone: [phone]   Fax:  [phone]  Name: [name] MRN: [id number] Date of Birth: 1936-06-13

## 2017-08-01 NOTE — Therapy (Signed)
[facility] MAIN [facility] SERVICES [address] [place], [place], [address] Phone: [phone]   Fax:  [phone]  Physical Therapy Treatment  Patient Details  Name: [name] MRN: [id number] Date of Birth: 1936/02/23 Referring Provider: Charlton Amor   Encounter Date: 08/01/2017  PT End of Session - 08/01/17 0950    Visit Number  6    Number of Visits  25    Date for PT Re-Evaluation  09/25/17    PT Start Time  0920    PT Stop Time  1000    PT Time Calculation (min)  40 min    Equipment Utilized During Treatment  Gait belt    Activity Tolerance  Patient tolerated treatment well    Behavior During Therapy  [facility] for tasks assessed/performed       Past Medical History:  Diagnosis Date  . Stroke [facility])     Past Surgical History:  Procedure Laterality Date  . LOOP RECORDER INSERTION N/A 06/20/2017   Procedure: LOOP RECORDER INSERTION;  Surgeon: [name], MD;  [facility]: [facility] INVAS[facility] CV LAB;  Service: Cardiovascular;  Laterality: N/A;  . TEE WITHOUT CARDIOVERSION N/A 06/19/2017   Procedure: TRANSESOPHAGEAL ECHOCARDIOGRAM (TEE) WITH LOOP;  Surgeon: Jake Bathe, MD;  Location: [facility] ENDOSCOPY;  Service: Cardiovascular;  Laterality: N/A;    There were no vitals filed for this visit.  Subjective Assessment - 08/01/17 0949    Subjective  Pt with no new complaints or concerns. Pt has been completing her HEP 7 days/wk without and questions or concerns.  Denies no falls since her fall on Jan 3rd.      Pertinent History  Patient was to [place] Jan 3 for CVA, then she went to in patient rehab for a week and came home Jan 16th. She was discharged from inpatient rehab without AD.     Limitations  Standing;Walking    How long can you stand comfortably?  she can stand as long as she wants to.    How long can you walk comfortably?  She walks as far as she wants to walk    Patient Stated Goals  Patients goal is to be able to stay alone at home  and not have her daughters live with her.     Currently in Pain?  No/denies    Pain Score  0-No pain    Multiple Pain Sites  No       Therapeutic exercise and neuromuscular training: 1/2 foam flat side up and balance with head turns left and right feet apart and feet together,cues for better posture Side stepping on blue balance beam left and right x 10 lengths, cues for going slowly and she needs UE support with LOB backwards  tandem standing on 1/2 foam  , cues for better posture  standing hip abd with GTB x 20  , cues to keep her shoulders upright  side stepping left and right GTB in parallel bars 10 feet x 3 step ups from floor to 6 inch stool x 20 bilateral marching in parallel bars x 20, cues for bigger steps Tilt board fwd/bwd, side to side left and right, needs UE support Stepping over bolster left and right and fwd/bwd, needs UE support with LOB backwards Tm walking 1. 5 m/hour x 5 mins, cues for long steps and standing posture correction  PT Education - 08/01/17 0950    Education provided  Yes    Education Details  HEP    Person(s) Educated  Patient    Methods  Explanation;Demonstration;Tactile cues;Verbal cues    Comprehension  Verbalized understanding;Returned demonstration       PT Short Term Goals - 07/19/17 1245      PT SHORT TERM GOAL #1   Title  Patient will be independent in home exercise program to improve strength/mobility for better functional independence with ADLs.    Time  4    Period  Weeks    Status  New      PT SHORT TERM GOAL #2   Title  Patient (> 80 years old) will complete five times sit to stand test in < 15 seconds indicating an increased LE strength and improved balance.    Baseline  2/6: 15.9 seconds    Time  4    Period  Weeks    Status  New        PT Long Term Goals - 07/19/17 1245      PT LONG TERM GOAL #1   Title  Patient will increase Berg Balance score by > 6 points (52/56)  to  demonstrate decreased fall risk during functional activities.    Baseline  2/6: 46/56    Time  12    Period  Weeks    Status  New    Target Date  09/25/17      PT LONG TERM GOAL #2   Title  Patient will be independent with ascend/descend 12 steps using single UE in step over step pattern without LOB.    Baseline  unsteady with single limb stance    Time  12    Period  Weeks    Status  New    Target Date  09/25/17      PT LONG TERM GOAL #3   Title  Patient will participate in the Senior Games to allow for return to prior level of function.     Baseline  Patient not able to perform standing balance required of senior games     Time  12    Period  Weeks    Status  New    Target Date  09/25/17      PT LONG TERM GOAL #4   Title  Patient will increase six minute walk test distance to >1500 for progression to age norm ambulator and improve gait ability    Baseline  2/6: 1128     Time  12    Period  Weeks    Status  New    Target Date  09/25/17            Plan - 08/01/17 0954    Clinical Impression Statement  Pt presents with unsteadiness on uneven surfaces and fatigues with therapeutic exercises. Patient needs assist with single leg balance activities and needs CGA assist with longer single leg timed standing activities. Patient demonstrates difficulty with dynamic standing balance and increased postural sway while on purple foam with decreased base of support and increased challenges for UE.  Patient tolerated all interventions well this date and will benefit from continued skilled PT interventions to improve strength and balance and decrease risk of falling    Rehab Potential  Good    PT Frequency  2x / week    PT Duration  12 weeks    PT Treatment/Interventions  Gait training;Therapeutic exercise;Therapeutic activities;Functional mobility training;Stair training;Balance training;Neuromuscular re-education;Patient/family  education;Manual techniques    PT Next Visit Plan  single  limb stance, dynamic balance    Consulted and Agree with Plan of Care  Patient       Patient will benefit from skilled therapeutic intervention in order to improve the following deficits and impairments:  Abnormal gait, Decreased balance, Decreased endurance, Decreased mobility, Difficulty walking, Impaired sensation, Decreased strength, Decreased safety awareness, Decreased coordination, Decreased activity tolerance  Visit Diagnosis: Other lack of coordination  Vision impairment  Muscle weakness (generalized)  Difficulty in walking, not elsewhere classified     Problem List Patient Active Problem List   Diagnosis Date Noted  . Gait disturbance, post-stroke 07/18/2017  . Cerebral hemorrhage (HCC) 07/11/2017  . Cognitive deficit, post-stroke   . Hyperglycemia   . Hypertension   . Neurologic gait disorder   . Cerebrovascular accident (CVA) due to occlusion of left posterior communicating artery (HCC) 06/20/2017  . Stroke (cerebrum) (HCC) 06/15/2017    Ezekiel Ina, PT DPT 08/01/2017, 10:01 AM  [facility] MAIN [facility] SERVICES [address] [place], [place], [address] Phone: [phone]   Fax:  [phone]  Name: [name] MRN: [id number] Date of Birth: 17-May-1936

## 2017-08-03 ENCOUNTER — Ambulatory Visit: Payer: Medicare Other | Admitting: Occupational Therapy

## 2017-08-03 ENCOUNTER — Encounter: Payer: Self-pay | Admitting: Physical Therapy

## 2017-08-03 ENCOUNTER — Ambulatory Visit: Payer: Medicare Other | Admitting: Physical Therapy

## 2017-08-03 DIAGNOSIS — I6329 Cerebral infarction due to unspecified occlusion or stenosis of other precerebral arteries: Secondary | ICD-10-CM

## 2017-08-03 DIAGNOSIS — M6281 Muscle weakness (generalized): Secondary | ICD-10-CM | POA: Diagnosis not present

## 2017-08-03 DIAGNOSIS — H547 Unspecified visual loss: Secondary | ICD-10-CM

## 2017-08-03 DIAGNOSIS — R262 Difficulty in walking, not elsewhere classified: Secondary | ICD-10-CM

## 2017-08-03 DIAGNOSIS — R278 Other lack of coordination: Secondary | ICD-10-CM

## 2017-08-03 NOTE — Therapy (Signed)
[facility] MAIN [facility] SERVICES [address] [place], [place], [address] Phone: [phone]   Fax:  [phone]  Physical Therapy Treatment  Patient Details  Name: [name] MRN: [id number] Date of Birth: 01-04-1936 Referring Provider: Charlton Amor   Encounter Date: 08/03/2017  PT End of Session - 08/03/17 1116    Visit Number  7    Number of Visits  25    Date for PT Re-Evaluation  09/25/17    PT Start Time  1100    PT Stop Time  1145    PT Time Calculation (min)  45 min    Equipment Utilized During Treatment  Gait belt    Activity Tolerance  Patient tolerated treatment well    Behavior During Therapy  [facility] for tasks assessed/performed       Past Medical History:  Diagnosis Date  . Stroke [facility])     Past Surgical History:  Procedure Laterality Date  . LOOP RECORDER INSERTION N/A 06/20/2017   Procedure: LOOP RECORDER INSERTION;  Surgeon: [name], MD;  [facility];  Service: Cardiovascular;  Laterality: N/A;  . TEE WITHOUT CARDIOVERSION N/A 06/19/2017   Procedure: TRANSESOPHAGEAL ECHOCARDIOGRAM (TEE) WITH LOOP;  Surgeon: Jake Bathe, MD;  Location: [facility] ENDOS[facility];  Service: Cardiovascular;  Laterality: N/A;    There were no vitals filed for this visit.  Subjective Assessment - 08/03/17 1115    Subjective  Pt with no new complaints or concerns. P Denies no falls since her fall on Jan 3rd.      Pertinent History  Patient was to [place] Jan 3 for CVA, then she went to in patient rehab for a week and came home Jan 16th. She was discharged from inpatient rehab without AD.     Limitations  Standing;Walking    How long can you stand comfortably?  she can stand as long as she wants to.    How long can you walk comfortably?  She walks as far as she wants to walk    Patient Stated Goals  Patients goal is to be able to stay alone at home and not have her daughters live with her.     Currently in Pain?  Yes        NEUROMUSCULAR RE-EDUCATION Purple foam  and balance with head turns left and right feet apart and feet together,, CGA and UE support needed occasionally  Tandem standing on purple foam with head turns and UE support , CGA assist for keeping balance without UE support   step ups from floor to first step on stairs from purple foam  x 20 bilateral with CGA , performance improves with increased practice Toe tapping standing on purple foam in sequence bottom left, top right, top left, bottom right, bottom left back to purple x 15 BLE  Matrix 17. 5 lbs and CGA with fwd/ bwd/side stepping left and right x 5  Patient needs occasional verbal cueing to improve posture and cueing to correctly perform exercises slowly CGA, increased difficulty and more unsteadiness with RLE fwd in tandem standing and  min cues to increase weight shift over RLE                       PT Education - 08/03/17 1116    Education provided  Yes    Education Details  HEP    Person(s) Educated  Patient    Methods  Explanation  Comprehension  Verbalized understanding       PT Short Term Goals - 07/19/17 1245      PT SHORT TERM GOAL #1   Title  Patient will be independent in home exercise program to improve strength/mobility for better functional independence with ADLs.    Time  4    Period  Weeks    Status  New      PT SHORT TERM GOAL #2   Title  Patient (> 80 years old) will complete five times sit to stand test in < 15 seconds indicating an increased LE strength and improved balance.    Baseline  2/6: 15.9 seconds    Time  4    Period  Weeks    Status  New        PT Long Term Goals - 07/19/17 1245      PT LONG TERM GOAL #1   Title  Patient will increase Berg Balance score by > 6 points (52/56)  to demonstrate decreased fall risk during functional activities.    Baseline  2/6: 46/56    Time  12    Period  Weeks    Status  New    Target Date  09/25/17      PT LONG TERM GOAL #2    Title  Patient will be independent with ascend/descend 12 steps using single UE in step over step pattern without LOB.    Baseline  unsteady with single limb stance    Time  12    Period  Weeks    Status  New    Target Date  09/25/17      PT LONG TERM GOAL #3   Title  Patient will participate in the Senior Games to allow for return to prior level of function.     Baseline  Patient not able to perform standing balance required of senior games     Time  12    Period  Weeks    Status  New    Target Date  09/25/17      PT LONG TERM GOAL #4   Title  Patient will increase six minute walk test distance to >1500 for progression to age norm ambulator and improve gait ability    Baseline  2/6: 1128     Time  12    Period  Weeks    Status  New    Target Date  09/25/17            Plan - 08/03/17 1117    Clinical Impression Statement  Patient has occasional LOB with dynamic standing challenges.. Pt was able to perform advanced dynamic standing balance exercises today with difficulty during tandem and challenges that include longer single leg standing.   Pt was able to perform all exercises with min /CGA assist and VC for technique..   Patient struggles with speed during movement as well as balance with unstable surfaces. He Pt encouraged to continue HEP .Follow-up as scheduled.    Rehab Potential  Good    PT Frequency  2x / week    PT Duration  12 weeks    PT Treatment/Interventions  Gait training;Therapeutic exercise;Therapeutic activities;Functional mobility training;Stair training;Balance training;Neuromuscular re-education;Patient/family education;Manual techniques    PT Next Visit Plan  single limb stance, dynamic balance    Consulted and Agree with Plan of Care  Patient       Patient will benefit from skilled therapeutic intervention in order to improve the following deficits and  impairments:  Abnormal gait, Decreased balance, Decreased endurance, Decreased mobility, Difficulty  walking, Impaired sensation, Decreased strength, Decreased safety awareness, Decreased coordination, Decreased activity tolerance  Visit Diagnosis: Other lack of coordination  Vision impairment  Muscle weakness (generalized)  Difficulty in walking, not elsewhere classified  Cerebrovascular accident (CVA) due to occlusion of left posterior communicating artery [facility])     Problem List Patient Active Problem List   Diagnosis Date Noted  . Gait disturbance, post-stroke 07/18/2017  . Cerebral hemorrhage (HCC) 07/11/2017  . Cognitive deficit, post-stroke   . Hyperglycemia   . Hypertension   . Neurologic gait disorder   . Cerebrovascular accident (CVA) due to occlusion of left posterior communicating artery (HCC) 06/20/2017  . Stroke (cerebrum) (HCC) 06/15/2017    Ezekiel Ina, PT DPT 08/03/2017, 11:19 AM  [facility] MAIN [facility] SERVICES [address] [place], [place], [address] Phone: [phone]   Fax:  [phone]  Name: [name] MRN: [id number] Date of Birth: 07-10-1935

## 2017-08-07 ENCOUNTER — Encounter: Payer: Self-pay | Admitting: Occupational Therapy

## 2017-08-07 ENCOUNTER — Ambulatory Visit: Payer: Medicare Other | Admitting: Occupational Therapy

## 2017-08-07 DIAGNOSIS — R278 Other lack of coordination: Secondary | ICD-10-CM

## 2017-08-07 DIAGNOSIS — M6281 Muscle weakness (generalized): Secondary | ICD-10-CM | POA: Diagnosis not present

## 2017-08-07 NOTE — Therapy (Addendum)
[facility] MAIN [facility] SERVICES [address] [place], [place], [address] Phone: [phone]   Fax:  [phone]  Occupational Therapy Treatment  Patient Details  Name: [name] MRN: [id number] Date of Birth: March 18, 1936 Referring Provider: Charlton Amor   Encounter Date: 08/07/2017  OT End of Session - 08/07/17 0936    Visit Number  7    Number of Visits  34    Date for OT Re-Evaluation  09/25/17    OT Start Time  0930    OT Stop Time  1015    OT Time Calculation (min)  45 min    Activity Tolerance  Treatment limited secondary to medical complications (Comment)    Behavior During Therapy  [facility] for tasks assessed/performed       Past Medical History:  Diagnosis Date  . Stroke [facility])     Past Surgical History:  Procedure Laterality Date  . LOOP RECORDER INSERTION N/A 06/20/2017   Procedure: LOOP RECORDER INSERTION;  Surgeon: [name], MD;  [facility];  Service: Cardiovascular;  Laterality: N/A;  . TEE WITHOUT CARDIOVERSION N/A 06/19/2017   Procedure: TRANSESOPHAGEAL ECHOCARDIOGRAM (TEE) WITH LOOP;  Surgeon: Jake Bathe, MD;  Location: [facility] ENDOS[facility];  Service: Cardiovascular;  Laterality: N/A;    There were no vitals filed for this visit.  Subjective Assessment - 08/07/17 0934    Patient is accompained by:  Family member    Pertinent History  Pt. is a 82 y.o. 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 Pt. received TPA. Pt. presented to the hospital with dizziness, right sided weakness, slurred speech, and blurred vision. Pt. PMHx includes: Hyperlipidemia, HTN, GERD, and Constipation.     Currently in Pain?  No/denies      OT TREATMENT    Neuromuscular re-education:  Pt. performed [facility] skills training to improve speed and dexterity needed for ADL tasks and writing. Pt. demonstrated grasping 1 inch sticks,  inch cylindrical collars, and  inch flat washers on the [employer]  pegboard. Pt. performed grasping each item with her 2nd digit and thumb, and storing them in the palm. Pt. presented with difficulty storing  inch objects at a time in the palmar aspect of the hand. Pt. Continues to require work on improving translatory skills of th hand.  Selfcare:  Pt. Worked on visual scanning, and visual search strategies with number crossouts, and double number crossouts. Pt. With fewer omissions today.                        OT Education - 08/07/17 0934    Education provided  Yes    Education Details  HEP    Methods  Explanation    Comprehension  Verbalized understanding          OT Long Term Goals - 07/19/17 1504      OT LONG TERM GOAL #1   Title  Pt. will independently demonstrate visual scanning and visual search strategies to be able to navigate within her environment during ADL, and IADL tasks.    Baseline  Pt. has difficulty    Time  12    Period  Weeks    Status  New    Target Date  09/25/17      OT LONG TERM GOAL #2   Title  Pt. will independently demonstrate visual scanning, and visual search strategies to be able to complete paperwork, and  forms.    Baseline  Pt. has difficulty    Time  12    Period  Weeks    Status  New    Target Date  09/25/17      OT LONG TERM GOAL #3   Title  Pt. will indepndently demonstrate visual compensatory strategies during ADLs, and IADLs.    Baseline  Pt. has difficulty    Time  12    Period  Weeks    Status  New    Target Date  09/25/17      OT LONG TERM GOAL #4   Title  Pt. will independently demonstrate cognitive compensatory strategies during ADLs, and IADLs.    Baseline  Pt. has difficulty    Time  12    Period  Weeks    Status  New    Target Date  09/25/17      OT LONG TERM GOAL #5   Title  Pt. will increase RUE strength to improve engagement in ADLs, and IADLs.    Baseline  decreased RUE strength    Time  12    Status  New    Target Date  09/25/17      OT LONG TERM  GOAL #6   Title  Pt. will increase right lateral pinch strength by 2# to assist with ADLs.    Baseline  Decreased strength    Time  12    Period  Weeks    Status  New    Target Date  09/25/17            Plan - 08/07/17 [id number]    Clinical Impression Statement  Pt. reports  that she is doing Sudoku at home. Pt. reports she is getting better, and reports her vision is getting better with reading the paper. Pt. reports feeling like her eyes are not dilating, and forgets to turn off the lights. Pt. continues to present with multipe omissions with scanning tasks tasks. Pt. is anxious to finish therapy, and get back to her senior games.     Occupational performance deficits (Please refer to evaluation for details):  ADL's    OT Frequency  2x / week    OT Duration  12 weeks    OT Treatment/Interventions  Self-care/ADL training;Therapeutic exercise;Patient/family education;Cognitive remediation/compensation;Therapeutic activities;Visual/perceptual remediation/compensation    Clinical Decision Making  Several treatment options, min-mod task modification necessary    Consulted and Agree with Plan of Care  Patient       Patient will benefit from skilled therapeutic intervention in order to improve the following deficits and impairments:  Decreased strength, Impaired UE functional use, Impaired vision/preception, Decreased cognition, Decreased knowledge of precautions, Decreased safety awareness, Decreased activity tolerance, Decreased coordination  Visit Diagnosis: Muscle weakness (generalized)  Other lack of coordination    Problem List Patient Active Problem List   Diagnosis Date Noted  . Gait disturbance, post-stroke 07/18/2017  . Cerebral hemorrhage (HCC) 07/11/2017  . Cognitive deficit, post-stroke   . Hyperglycemia   . Hypertension   . Neurologic gait disorder   . Cerebrovascular accident (CVA) due to occlusion of left posterior communicating artery (HCC) 06/20/2017  . Stroke  (cerebrum) (HCC) 06/15/2017    Olegario MessierElaine Tallulah Hosman, MS, OTR/L 08/07/2017, 10:02 AM  [facility] [address] [place], [place], [address] Phone: [phone]   Fax:  [phone]  Name: [name] MRN: [id number] Date of Birth: 12/17/1935

## 2017-08-08 ENCOUNTER — Encounter: Payer: Self-pay | Admitting: Speech Pathology

## 2017-08-08 ENCOUNTER — Ambulatory Visit: Payer: Medicare Other | Admitting: Physical Therapy

## 2017-08-08 ENCOUNTER — Encounter: Payer: Self-pay | Admitting: Physical Therapy

## 2017-08-08 ENCOUNTER — Ambulatory Visit: Payer: Medicare Other | Admitting: Speech Pathology

## 2017-08-08 DIAGNOSIS — R262 Difficulty in walking, not elsewhere classified: Secondary | ICD-10-CM

## 2017-08-08 DIAGNOSIS — M6281 Muscle weakness (generalized): Secondary | ICD-10-CM

## 2017-08-08 DIAGNOSIS — R278 Other lack of coordination: Secondary | ICD-10-CM

## 2017-08-08 DIAGNOSIS — R41841 Cognitive communication deficit: Secondary | ICD-10-CM

## 2017-08-08 NOTE — Therapy (Signed)
[place] [facility] MAIN [facility] SERVICES [address] [place], [place], [address] Phone: [phone]   Fax:  [phone]  Speech Language Pathology Treatment  Patient Details  Name: [name] MRN: [id number] Date of Birth: [birth year] Referring Provider: Dr. Lina Sayre   Encounter Date: 08/08/2017  End of Session - 08/08/17 1026    Visit Number  2    Number of Visits  8    Date for SLP Re-Evaluation  09/19/17    SLP Start Time  0900    SLP Stop Time   1000    SLP Time Calculation (min)  60 min    Activity Tolerance  Patient tolerated treatment well       Past Medical History:  Diagnosis Date  . Stroke [facility])     Past Surgical History:  Procedure Laterality Date  . LOOP RECORDER INSERTION N/A 06/20/2017   Procedure: LOOP RECORDER INSERTION;  Surgeon: [name], MD;  [facility];  Service: Cardiovascular;  Laterality: N/A;  . TEE WITHOUT CARDIOVERSION N/A 06/19/2017   Procedure: TRANSESOPHAGEAL ECHOCARDIOGRAM (TEE) WITH LOOP;  Surgeon: Jake Bathe, MD;  Location: [facility] ENDOS[facility];  Service: Cardiovascular;  Laterality: N/A;    There were no vitals filed for this visit.  Subjective Assessment - 08/08/17 1025    Subjective  "I would rather be practicing for the senior games than coming here for therapy. It is difficult for me to do both"    Currently in Pain?  No/denies            ADULT SLP TREATMENT - 08/08/17 0001      General Information   Behavior/Cognition  Alert;Cooperative;Pleasant mood;Distractible;Decreased sustained attention    Patient Positioning  Upright in chair    HPI  82 year old female referred for outpatient speech therapy following Left PCA infarct 06/15/17. Pt lives alone, and has assistance from her sister and a friend for shopping. bill pay online. Pt reports using the microwave and eating salads, rarely using the stove/oven to cook. She reports being independent with self med (aspirin and Lipitor  1x/day). She also reports difficulty with short term memory even prior to her CVA.       Treatment Provided   Treatment provided  Cognitive-Linquistic      Pain Assessment   Pain Assessment  No/denies pain      Cognitive-Linquistic Treatment   Treatment focused on  Cognition;Patient/family/caregiver education    Skilled Treatment  Pt seen for skilled ST focusing on established goals for increased safety and independence, completion of personal responsibilities, improved cognitive linguistic tasks including problem solving, reasoning, thought organization and functional recall. Pt tends to be talkative, and frequently requires redirection during therapy. There appears to be minimal awareness of deficits and their functional impact.    Today's session included a lengthy discussion regarding the senior games, in which pt is highly motivated to participate. Pt and SLP discussed sports she plans to play in the upcoming games, including cornhole, horseshoes, bocce ball, softball throw, shuffleboard and croquet, football throw and basketball shoot, and spin casting. Goals from PT and OT were discussed in relation to these games (balance, right UE strength, visual acuity/aim, endurance, etc). This information was provided in written form to pt, PT, and OT, as pt verbalized increasing awareness of the possibility that these games may be more challenging for her than she realizes. Will continue to address cognitive linguistic deficits per POC.      Assessment / Recommendations /  Plan   Plan  Continue with current plan of care      Progression Toward Goals   Progression toward goals  Progressing toward goals       SLP Education - 08/08/17 1026    Education provided  Yes    Education Details  physical and visual requirements of participation in senior games    Person(s) Educated  Patient    Methods  Explanation;Demonstration;Verbal cues;Handout    Comprehension  Verbalized understanding;Need further  instruction;Verbal cues required         SLP Long Term Goals - 08/08/17 1030      SLP LONG TERM GOAL #1   Title  Pt will demonstrate functional cognitive-communication skills for independent completion of personal responsibilities    Time  8    Period  Weeks    Status  On-going    Target Date  09/22/17      SLP LONG TERM GOAL #2   Title  Pt will complete complex cognitive-linguistic tasks (thought organization, problem solving, reasoning) with 80% accuracy given min assist    Time  8    Period  Weeks    Status  On-going    Target Date  09/22/17      SLP LONG TERM GOAL #3   Title  Pt will demonstrate functional use of external memory aids with 80% accuracy.    Time  8    Period  Weeks    Status  On-going    Target Date  09/22/17       Plan - 08/08/17 1027    Clinical Impression Statement  Pt verbalizes managing home tasks without difficulty. She indicates being independent with self medication of aspirin and lipitor (each conce a day). Bills are set up online. Her sister and a friend take her shopping for food. She appears to have decreased awareness of her deficits and their functional impact. She is talkative and distractible but very pleasant. Continued skilled ST intervention is recommended to maximize cognitive linguistic function for safety and independence.    Speech Therapy Frequency  1x /week    Duration  -- 8 weeks    Treatment/Interventions  Cognitive reorganization;SLP instruction and feedback;Internal/external aids;Compensatory strategies;Patient/family education;Multimodal communcation approach;Functional tasks    Potential to Achieve Goals  Good    Potential Considerations  Ability to learn/carryover information;Previous level of function;Family/community support    SLP Home Exercise Plan  increase awareness of physical/visual requirements of senior games    Consulted and Agree with Plan of Care  Patient       Patient will benefit from skilled therapeutic  intervention in order to improve the following deficits and impairments:   Cognitive communication deficit    Problem List Patient Active Problem List   Diagnosis Date Noted  . Gait disturbance, post-stroke 07/18/2017  . Cerebral hemorrhage (HCC) 07/11/2017  . Cognitive deficit, post-stroke   . Hyperglycemia   . Hypertension   . Neurologic gait disorder   . Cerebrovascular accident (CVA) due to occlusion of left posterior communicating artery (HCC) 06/20/2017  . Stroke (cerebrum) (HCC) 06/15/2017   Bryndon Cumbie B. Murvin NatalBueche, Ridgeview HospitalMSP, CCC-SLP Speech Language Pathologist  Leigh AuroraBueche, Elaine Roanhorse Brown 08/08/2017, 10:31 AM   [facility] [address] [place], [place], [address] Phone: [phone]   Fax:  [phone]   Name: Cecile SheererReva R Satterly MRN: [id number] Date of Birth: 05/24/1936

## 2017-08-08 NOTE — Patient Instructions (Addendum)
SIT TO STAND: No Device   Sit with feet shoulder-width apart, on floor.(Make sure that you are in a chair that won't move like a chair against a wall or couch etc) Lean chest forward, raise hips up from surface. Straighten hips and knees. Weight bear equally on left and right sides. 10___ reps per set, _2__ sets per day, _5__ days per week Place left leg closer to sitting surface.  Copyright  VHI. All rights reserved.  Backward Walking   Walk backward, toes of each foot coming down first. Take long, even strides. Make sure you have a clear pathway with no obstructions when you do this. Stand beside counter and walk backward  And then walk forward doing opposite directions; repeat 10 laps 2x a day at least 5 days a week.  Copyright  VHI. All rights reserved.  Tandem Walking   Stand beside kitchen sink and place one foot in front of the other, lift your hand and try to hold position for 10 sec. Repeat with other foot in front; Repeat 5 reps with each foot in front 5 days a week.Balance: Unilateral   Attempt to balance on left leg, eyes open. Hold _5-10___ seconds.Start with holding onto counter and if you get your balance you can try to let go of counter. Repeat __5__ times per set. Do __1__ sets per session. Do __1__ sessions per day. Keep eyes open:   http://orth.exer.us/29   Copyright  VHI. All rights reserved.    Forward - Press Up    Lunge forward, fully extend arms up and back overhead. Arms even with ears. Do __1_ sets _10__ reps each foot in front. For one set, do reps all one leg then other.  Copyright  VHI. All rights reserved.

## 2017-08-08 NOTE — Therapy (Signed)
[place] [facility] MAIN [facility] SERVICES [address] [place], [place], [address] Phone: [phone]   Fax:  [phone]  Physical Therapy Treatment  Patient Details  Name: [name] MRN: [id number] Date of Birth: Oct 26, 1935 Referring Provider: Charlton Amor   Encounter Date: 08/08/2017  PT End of Session - 08/08/17 1028    Visit Number  8    Number of Visits  25    Date for PT Re-Evaluation  09/25/17    PT Start Time  1017    PT Stop Time  1100    PT Time Calculation (min)  43 min    Equipment Utilized During Treatment  Gait belt    Activity Tolerance  Patient tolerated treatment well    Behavior During Therapy  [facility] for tasks assessed/performed       Past Medical History:  Diagnosis Date  . Stroke [facility])     Past Surgical History:  Procedure Laterality Date  . LOOP RECORDER INSERTION N/A 06/20/2017   Procedure: LOOP RECORDER INSERTION;  Surgeon: [name], MD;  [facility];  Service: Cardiovascular;  Laterality: N/A;  . TEE WITHOUT CARDIOVERSION N/A 06/19/2017   Procedure: TRANSESOPHAGEAL ECHOCARDIOGRAM (TEE) WITH LOOP;  Surgeon: Jake Bathe, MD;  Location: [facility]OS[facility];  Service: Cardiovascular;  Laterality: N/A;    There were no vitals filed for this visit.  Subjective Assessment - 08/08/17 1025    Subjective  Patient reports, "I want to get this over with so that I can get to the senior games." When asked what exercise she is doing at home she states, "I am picking up limbs and trash out of the yard, keep up with the dog." She denies any recent falls;     Pertinent History  Patient was to [place] Jan 3 for CVA, then she went to in patient rehab for a week and came home Jan 16th. She was discharged from inpatient rehab without AD.     Limitations  Standing;Walking    How long can you stand comfortably?  she can stand as long as she wants to.    How long can you walk comfortably?  She walks as far as she wants  to walk    Patient Stated Goals  Patients goal is to be able to stay alone at home and not have her daughters live with her.     Currently in Pain?  No/denies    Multiple Pain Sites  No        TREATMENT: Warm up on Nustep, BUE/BLE level 3 x5 min (Unbilled);  Patient instructed in advanced balance exercise:  Resisted walking 17.5# forward/backward, side/side, 2way x2 laps each direction with min A for safety; Patient required cues to increase step length and slow down eccentric return for better dynamic balance;   Advanced HEP: SLS on firm surface 5-10 sec hold x2 reps each LE Tandem stance on firm surface 10 sec hold unsupported x2 sets each foot in front; Forward/backward walking x10 feet x3 laps unsupported with cues to increase step length for better challenge; Forward lunges with BUE arms overhead x5 reps each leg in front with CGA for safety; Sit<>Stand with yellow weighted ball overhead press x10 reps unsupported to facilitate better sit<>stand ability; Patient required min VCs for balance stability, including to increase trunk control for less loss of balance with smaller base of support  Star SLS kicks x2 sets each LE with min-mod A and mod VCs for sequencing  and foot placement. Patient has difficulty maintaining SLS during kicks due to decreased weight shift;   4 square stepping: x3 sets clockwise/counterclockwise each, multiple direction step x3-4 min unsupported; Required cues to improve step length for better foot clearance; She required increased time during multiple direction step due to confusion over direction;  Standing on airex: Modified tandem stance: BUE ball pass side/side x10 reps each direction; mod A and cues to slow down upper trunk movement for better stance control and less unsteadiness;   Feet apart on 1/2 bolster (flat side up): Heel/toe rocking x15 reps with rail assist; Feet apart, in neutral, BUE wand flexion x10 reps with min A for safety; With cues  to improve ankle strategies and weight shift for better stance control;                 PT Education - 08/08/17 1027    Education provided  Yes    Education Details  balance exercise, HEP advanced,     Person(s) Educated  Patient    Methods  Explanation;Demonstration;Verbal cues    Comprehension  Verbalized understanding;Returned demonstration;Verbal cues required;Need further instruction       PT Short Term Goals - 07/19/17 1245      PT SHORT TERM GOAL #1   Title  Patient will be independent in home exercise program to improve strength/mobility for better functional independence with ADLs.    Time  4    Period  Weeks    Status  New      PT SHORT TERM GOAL #2   Title  Patient (> 78 years old) will complete five times sit to stand test in < 15 seconds indicating an increased LE strength and improved balance.    Baseline  2/6: 15.9 seconds    Time  4    Period  Weeks    Status  New        PT Long Term Goals - 07/19/17 1245      PT LONG TERM GOAL #1   Title  Patient will increase Berg Balance score by > 6 points (52/56)  to demonstrate decreased fall risk during functional activities.    Baseline  2/6: 46/56    Time  12    Period  Weeks    Status  New    Target Date  09/25/17      PT LONG TERM GOAL #2   Title  Patient will be independent with ascend/descend 12 steps using single UE in step over step pattern without LOB.    Baseline  unsteady with single limb stance    Time  12    Period  Weeks    Status  New    Target Date  09/25/17      PT LONG TERM GOAL #3   Title  Patient will participate in the Senior Games to allow for return to prior level of function.     Baseline  Patient not able to perform standing balance required of senior games     Time  12    Period  Weeks    Status  New    Target Date  09/25/17      PT LONG TERM GOAL #4   Title  Patient will increase six minute walk test distance to >1500 for progression to age norm ambulator and  improve gait ability    Baseline  2/6: 1128     Time  12    Period  Weeks  Status  New    Target Date  09/25/17            Plan - 08/08/17 1225    Clinical Impression Statement  Patient instructed in advanced balance exercise. Instructed patient in HEP to challenge stance and dynamic balance control; Patient required mod Vcs to slow down movement for better control. Patient very impulsive and requires mod Vcs for correct positioning and exercise technique; She requires close supervision to min-mod A with balance exercise. patient would benefit from additional skilled PT intervention to improve balance, gait safety;     Rehab Potential  Good    PT Frequency  2x / week    PT Duration  12 weeks    PT Treatment/Interventions  Gait training;Therapeutic exercise;Therapeutic activities;Functional mobility training;Stair training;Balance training;Neuromuscular re-education;Patient/family education;Manual techniques    PT Next Visit Plan  single limb stance, dynamic balance    Consulted and Agree with Plan of Care  Patient       Patient will benefit from skilled therapeutic intervention in order to improve the following deficits and impairments:  Abnormal gait, Decreased balance, Decreased endurance, Decreased mobility, Difficulty walking, Impaired sensation, Decreased strength, Decreased safety awareness, Decreased coordination, Decreased activity tolerance  Visit Diagnosis: Muscle weakness (generalized)  Other lack of coordination  Difficulty in walking, not elsewhere classified     Problem List Patient Active Problem List   Diagnosis Date Noted  . Gait disturbance, post-stroke 07/18/2017  . Cerebral hemorrhage (HCC) 07/11/2017  . Cognitive deficit, post-stroke   . Hyperglycemia   . Hypertension   . Neurologic gait disorder   . Cerebrovascular accident (CVA) due to occlusion of left posterior communicating artery (HCC) 06/20/2017  . Stroke (cerebrum) (HCC) 06/15/2017     Maxi Carreras PT, DPT 08/08/2017, 12:28 PM  [place] [facility] [address] [place], [place], [address] Phone: [phone]   Fax:  [phone]  Name: Cecile SheererReva R Bushway MRN: [id number] Date of Birth: 07/16/1935

## 2017-08-10 ENCOUNTER — Ambulatory Visit: Payer: Medicare Other | Admitting: Occupational Therapy

## 2017-08-10 ENCOUNTER — Ambulatory Visit: Payer: Medicare Other | Admitting: Physical Therapy

## 2017-08-15 ENCOUNTER — Encounter: Payer: Self-pay | Admitting: Occupational Therapy

## 2017-08-15 ENCOUNTER — Ambulatory Visit: Payer: Medicare Other | Attending: Physician Assistant | Admitting: Occupational Therapy

## 2017-08-15 ENCOUNTER — Ambulatory Visit: Payer: Medicare Other

## 2017-08-15 VITALS — BP 141/90 | HR 91

## 2017-08-15 DIAGNOSIS — R278 Other lack of coordination: Secondary | ICD-10-CM | POA: Diagnosis present

## 2017-08-15 DIAGNOSIS — H547 Unspecified visual loss: Secondary | ICD-10-CM | POA: Insufficient documentation

## 2017-08-15 DIAGNOSIS — R262 Difficulty in walking, not elsewhere classified: Secondary | ICD-10-CM | POA: Diagnosis present

## 2017-08-15 DIAGNOSIS — M6281 Muscle weakness (generalized): Secondary | ICD-10-CM | POA: Diagnosis present

## 2017-08-15 DIAGNOSIS — R41841 Cognitive communication deficit: Secondary | ICD-10-CM | POA: Diagnosis present

## 2017-08-15 LAB — CUP PACEART REMOTE DEVICE CHECK
MDC IDC PG IMPLANT DT: 20190108
MDC IDC SESS DTM: 20190207173538

## 2017-08-15 NOTE — Therapy (Signed)
[place] [facility] MAIN [facility] SERVICES [address] [place], [place], [address] Phone: [phone]   Fax:  [phone]  Occupational Therapy Treatment  Patient Details  Name: [name] MRN: [id number] Date of Birth: 1935/09/06 Referring Provider: Charlton Amor   Encounter Date: 08/15/2017  OT End of Session - 08/15/17 1359    Visit Number  8    Number of Visits  34    Date for OT Re-Evaluation  09/25/17    OT Start Time  1350    OT Stop Time  1430    OT Time Calculation (min)  40 min    Activity Tolerance  Treatment limited secondary to medical complications (Comment)    Behavior During Therapy  [facility] for tasks assessed/performed       Past Medical History:  Diagnosis Date  . Stroke [facility])     Past Surgical History:  Procedure Laterality Date  . LOOP RECORDER INSERTION N/A 06/20/2017   Procedure: LOOP RECORDER INSERTION;  Surgeon: [name], MD;  [facility];  Service: Cardiovascular;  Laterality: N/A;  . TEE WITHOUT CARDIOVERSION N/A 06/19/2017   Procedure: TRANSESOPHAGEAL ECHOCARDIOGRAM (TEE) WITH LOOP;  Surgeon: Jake Bathe, MD;  [facility];  Service: Cardiovascular;  Laterality: N/A;    There were no vitals filed for this visit.  Subjective Assessment - 08/15/17 1355    Subjective   Pt. reports she went to the first practice for the senior games. Pt. reports she did not do well.    Patient is accompained by:  Family member    Pertinent History  Pt. is a 82 y.o. 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 Pt. received TPA. Pt. presented to the hospital with dizziness, right sided weakness, slurred speech, and blurred vision. Pt. PMHx includes: Hyperlipidemia, HTN, GERD, and Constipation.     Currently in Pain?  No/denies      OT TREATMENT    Selfcare:  Pt. worked on visual scanning, and visual search strategies for tabletop tasks. Pt. continues to have multiple  omissions. Pt. worked on tasks to work on Administrator, sports with throwing to a target to simulate throwing during the senior games. Pt. requires verbal cues, and visual cues for hand position. Pt. Worked on strategies to improve throwing accuracy. Pt. Attempted to throw with her LUE instead of her RUE secondary to sensory changes in her LUE.                          OT Education - 08/15/17 1356    Education provided  Yes    Education Details  Visual compensatory strategies.    Person(s) Educated  Patient    Methods  Explanation;Demonstration;Verbal cues    Comprehension  Verbalized understanding;Returned demonstration;Verbal cues required;Need further instruction          OT Long Term Goals - 07/19/17 1504      OT LONG TERM GOAL #1   Title  Pt. will independently demonstrate visual scanning and visual search strategies to be able to navigate within her environment during ADL, and IADL tasks.    Baseline  Pt. has difficulty    Time  12    Period  Weeks    Status  New    Target Date  09/25/17      OT LONG TERM GOAL #2   Title  Pt. will independently demonstrate visual  scanning, and visual search strategies to be able to complete paperwork, and forms.    Baseline  Pt. has difficulty    Time  12    Period  Weeks    Status  New    Target Date  09/25/17      OT LONG TERM GOAL #3   Title  Pt. will indepndently demonstrate visual compensatory strategies during ADLs, and IADLs.    Baseline  Pt. has difficulty    Time  12    Period  Weeks    Status  New    Target Date  09/25/17      OT LONG TERM GOAL #4   Title  Pt. will independently demonstrate cognitive compensatory strategies during ADLs, and IADLs.    Baseline  Pt. has difficulty    Time  12    Period  Weeks    Status  New    Target Date  09/25/17      OT LONG TERM GOAL #5   Title  Pt. will increase RUE strength to improve engagement in ADLs, and IADLs.    Baseline  decreased RUE  strength    Time  12    Status  New    Target Date  09/25/17      OT LONG TERM GOAL #6   Title  Pt. will increase right lateral pinch strength by 2# to assist with ADLs.    Baseline  Decreased strength    Time  12    Period  Weeks    Status  New    Target Date  09/25/17            Plan - 08/15/17 1359    Clinical Impression Statement  Pt. is playing corn hole, and the football toss in the senior games, which started theis morning. Pt. reports having difficulty with corn hole, and the basketball shoot. Pt. also plans to play BOCCI ball. Pt. has signed up into a new age bracket. Pt. continues to work on improving UE strength, and coordination skills. Pt. continues to require cues for visual scanning, and visual search strategies.    Occupational performance deficits (Please refer to evaluation for details):  ADL's    OT Frequency  2x / week    OT Duration  12 weeks    OT Treatment/Interventions  Self-care/ADL training;Therapeutic exercise;Patient/family education;Cognitive remediation/compensation;Therapeutic activities;Visual/perceptual remediation/compensation    Plan  Assess visual to determine how it may be affecting ADL, and IADL functioning.    Clinical Decision Making  Several treatment options, min-mod task modification necessary    Consulted and Agree with Plan of Care  Patient       Patient will benefit from skilled therapeutic intervention in order to improve the following deficits and impairments:  Decreased strength, Impaired UE functional use, Impaired vision/preception, Decreased cognition, Decreased knowledge of precautions, Decreased safety awareness, Decreased activity tolerance, Decreased coordination  Visit Diagnosis: Muscle weakness (generalized)  Other lack of coordination  Vision impairment    Problem List Patient Active Problem List   Diagnosis Date Noted  . Gait disturbance, post-stroke 07/18/2017  . Cerebral hemorrhage (HCC) 07/11/2017  .  Cognitive deficit, post-stroke   . Hyperglycemia   . Hypertension   . Neurologic gait disorder   . Cerebrovascular accident (CVA) due to occlusion of left posterior communicating artery (HCC) 06/20/2017  . Stroke (cerebrum) (HCC) 06/15/2017    Olegario MessierElaine Farrie Sann, MS, OTR/L 08/15/2017, 4:04 PM  [place] [facility] [phone]  [address] [place], [place], [address] Phone: [phone]   Fax:  [phone]  Name: [name] MRN: [id number] Date of Birth: July 20, 1935

## 2017-08-15 NOTE — Therapy (Signed)
[facility] MAIN [facility] SERVICES [address] [place], [place], [address] Phone: [phone]   Fax:  [phone]  Physical Therapy Treatment  Patient Details  Name: [name] MRN: [id number] Date of Birth: March 28, 1936 Referring Provider: Charlton Amor   Encounter Date: 08/15/2017  PT End of Session - 08/15/17 1438    Visit Number  9    Number of Visits  25    Date for PT Re-Evaluation  09/25/17    PT Start Time  1435    PT Stop Time  1515    PT Time Calculation (min)  40 min    Equipment Utilized During Treatment  Gait belt    Activity Tolerance  Patient tolerated treatment well    Behavior During Therapy  [facility] for tasks assessed/performed       Past Medical History:  Diagnosis Date  . Stroke [facility])     Past Surgical History:  Procedure Laterality Date  . LOOP RECORDER INSERTION N/A 06/20/2017   Procedure: LOOP RECORDER INSERTION;  Surgeon: [name], MD;  [facility];  Service: Cardiovascular;  Laterality: N/A;  . TEE WITHOUT CARDIOVERSION N/A 06/19/2017   Procedure: TRANSESOPHAGEAL ECHOCARDIOGRAM (TEE) WITH LOOP;  Surgeon: Jake Bathe, MD;  Location: [facility] ENDOS[facility];  Service: Cardiovascular;  Laterality: N/A;    Vitals:   08/15/17 1437  BP: (!) 141/90  Pulse: 91  SpO2: 99%    Subjective Assessment - 08/15/17 1436    Subjective  Pt states she is doing is well today. No specific questions or concerns today. She is performing HEP without issue.     Pertinent History  Patient was to [place] Jan 3 for CVA, then she went to in patient rehab for a week and came home Jan 16th. She was discharged from inpatient rehab without AD.     Limitations  Standing;Walking    How long can you stand comfortably?  she can stand as long as she wants to.    How long can you walk comfortably?  She walks as far as she wants to walk    Patient Stated Goals  Patients goal is to be able to stay alone at home and not have her  daughters live with her.     Currently in Pain?  No/denies           TREATMENT  Ther-ex  Quantum single leg press 75# x 15, 90# x 15 bilateral  Resisted walking 17.5# forward/backward, side/side x 2 laps each direction with min A for safety; Patient required cues to increase step length and slow down eccentric return for better dynamic balance;   Neuromuscular Re-education  SLS on firm surface 5-10 sec hold x 2 reps each LE Tandem gait in // bars on 2"x4" x multiple laps; Side stepping on Airex balance beam x multiple laps; Tandem gait on Airex balance beam x multiple laps; 1/2 foam roll, flat side up, A/P balance without UE support; 1/2 foam roll, flat side up, A/P heel/toe rocking without UE support;                       PT Education - 08/15/17 1438    Education provided  Yes    Education Details  exercise form/technique    Person(s) Educated  Patient    Methods  Explanation    Comprehension  Verbalized understanding       PT Short Term Goals - 07/19/17 1245  PT SHORT TERM GOAL #1   Title  Patient will be independent in home exercise program to improve strength/mobility for better functional independence with ADLs.    Time  4    Period  Weeks    Status  New      PT SHORT TERM GOAL #2   Title  Patient (> 80 years old) will complete five times sit to stand test in < 15 seconds indicating an increased LE strength and improved balance.    Baseline  2/6: 15.9 seconds    Time  4    Period  Weeks    Status  New        PT Long Term Goals - 07/19/17 1245      PT LONG TERM GOAL #1   Title  Patient will increase Berg Balance score by > 6 points (52/56)  to demonstrate decreased fall risk during functional activities.    Baseline  2/6: 46/56    Time  12    Period  Weeks    Status  New    Target Date  09/25/17      PT LONG TERM GOAL #2   Title  Patient will be independent with ascend/descend 12 steps using single UE in step over step  pattern without LOB.    Baseline  unsteady with single limb stance    Time  12    Period  Weeks    Status  New    Target Date  09/25/17      PT LONG TERM GOAL #3   Title  Patient will participate in the Senior Games to allow for return to prior level of function.     Baseline  Patient not able to perform standing balance required of senior games     Time  12    Period  Weeks    Status  New    Target Date  09/25/17      PT LONG TERM GOAL #4   Title  Patient will increase six minute walk test distance to >1500 for progression to age norm ambulator and improve gait ability    Baseline  2/6: 1128     Time  12    Period  Weeks    Status  New    Target Date  09/25/17            Plan - 08/15/17 1439    Clinical Impression Statement  Pt demonstrates excellent motivation with therapy today. She demonstrates challenge with eccentric control during single leg press. Pt is somewhat impulsive during session and requires cues for proper exercise form/technique. Pt encouraged to continue HEP and follow-up as scheduled.     Rehab Potential  Good    PT Frequency  2x / week    PT Duration  12 weeks    PT Treatment/Interventions  Gait training;Therapeutic exercise;Therapeutic activities;Functional mobility training;Stair training;Balance training;Neuromuscular re-education;Patient/family education;Manual techniques    PT Next Visit Plan  single limb stance, dynamic balance    Consulted and Agree with Plan of Care  Patient       Patient will benefit from skilled therapeutic intervention in order to improve the following deficits and impairments:  Abnormal gait, Decreased balance, Decreased endurance, Decreased mobility, Difficulty walking, Impaired sensation, Decreased strength, Decreased safety awareness, Decreased coordination, Decreased activity tolerance  Visit Diagnosis: Muscle weakness (generalized)  Difficulty in walking, not elsewhere classified     Problem List Patient Active  Problem List   Diagnosis Date Noted  .  Gait disturbance, post-stroke 07/18/2017  . Cerebral hemorrhage (HCC) 07/11/2017  . Cognitive deficit, post-stroke   . Hyperglycemia   . Hypertension   . Neurologic gait disorder   . Cerebrovascular accident (CVA) due to occlusion of left posterior communicating artery (HCC) 06/20/2017  . Stroke (cerebrum) (HCC) 06/15/2017   Lynnea Maizes PT, DPT   Liann Spaeth 08/16/2017, 8:34 PM  [place] [facility] MAIN [facility] SERVICES [address] [place], [place], [address] Phone: [phone]   Fax:  [phone]  Name: [name] MRN: [id number] Date of Birth: Sep 24, 1935

## 2017-08-16 ENCOUNTER — Ambulatory Visit: Payer: Medicare Other

## 2017-08-17 ENCOUNTER — Ambulatory Visit: Payer: Medicare Other | Admitting: Physical Therapy

## 2017-08-17 ENCOUNTER — Encounter: Payer: Self-pay | Admitting: Physical Therapy

## 2017-08-17 DIAGNOSIS — R278 Other lack of coordination: Secondary | ICD-10-CM

## 2017-08-17 DIAGNOSIS — M6281 Muscle weakness (generalized): Secondary | ICD-10-CM | POA: Diagnosis not present

## 2017-08-17 DIAGNOSIS — R262 Difficulty in walking, not elsewhere classified: Secondary | ICD-10-CM

## 2017-08-17 NOTE — Therapy (Signed)
[place] MAIN [facility] SERVICES [address] [place], [place], [address] Phone: [phone]   Fax:  [phone]  Physical Therapy Treatment/Discharge Note  Patient Details  Name: [name] MRN: [id number] Date of Birth: [birth year] Referring Provider: Cathlyn Parsons   Encounter Date: 08/17/2017  PT End of Session - 08/17/17 1105    Visit Number  10    Number of Visits  25    Date for PT Re-Evaluation  09/25/17    PT Start Time  1102    PT Stop Time  1145    PT Time Calculation (min)  43 min    Equipment Utilized During Treatment  Gait belt    Activity Tolerance  Patient tolerated treatment well    Behavior During Therapy  [facility] for tasks assessed/performed       Past Medical History:  Diagnosis Date  . Stroke [facility])     Past Surgical History:  Procedure Laterality Date  . LOOP RECORDER INSERTION N/A 06/20/2017   Procedure: LOOP RECORDER INSERTION;  Surgeon: Thompson Grayer, MD;  Location: [place] CV LAB;  Service: Cardiovascular;  Laterality: N/A;  . TEE WITHOUT CARDIOVERSION N/A 06/19/2017   Procedure: TRANSESOPHAGEAL ECHOCARDIOGRAM (TEE) WITH LOOP;  Surgeon: Jerline Pain, MD;  [facility];  Service: Cardiovascular;  Laterality: N/A;    There were no vitals filed for this visit.  Subjective Assessment - 08/17/17 1104    Subjective  Patient reports doing well; She reports, "I signed up for the senior games. I didn't do any partner activities. I think that I am going to keep coming for this month and then I think I'll be done."     Pertinent History  Patient was to [place] Jan 3 for CVA, then she went to in patient rehab for a week and came home Jan 16th. She was discharged from inpatient rehab without AD.     Limitations  Standing;Walking    How long can you stand comfortably?  she can stand as long as she wants to.    How long can you walk comfortably?  She walks as far as she wants to walk    Patient Stated Goals   Patients goal is to be able to stay alone at home and not have her daughters live with her.     Currently in Pain?  No/denies    Multiple Pain Sites  No         [facility] PT Assessment - 08/17/17 0001      6 minute walk test results    Aerobic Endurance Distance Walked  1575    Endurance additional comments  without AD, community ambulator,improved from 07/19/17 which was 1120 feet      Standardized Balance Assessment   Five times sit to stand comments   10.7 sec without HHA (<15 sec indicates low fall risk, improved from 07/19/17 which was 15.9 sec)      Berg Balance Test   Sit to Stand  Able to stand without using hands and stabilize independently    Standing Unsupported  Able to stand safely 2 minutes    Sitting with Back Unsupported but Feet Supported on Floor or Stool  Able to sit safely and securely 2 minutes    Stand to Sit  Sits safely with minimal use of hands    Transfers  Able to transfer safely, minor use of hands    Standing Unsupported with Eyes Closed  Able to stand 10  seconds safely    Standing Ubsupported with Feet Together  Able to place feet together independently and stand 1 minute safely    From Standing, Reach Forward with Outstretched Arm  Can reach forward >12 cm safely (5")    From Standing Position, Pick up Object from [place] to pick up shoe safely and easily    From Standing Position, Turn to Look Behind Over each Shoulder  Looks behind from both sides and weight shifts well    Turn 360 Degrees  Able to turn 360 degrees safely in 4 seconds or less    Standing Unsupported, Alternately Place Feet on Step/Stool  Able to stand independently and safely and complete 8 steps in 20 seconds    Standing Unsupported, One Foot in Front  Able to plae foot ahead of the other independently and hold 30 seconds    Standing on One Leg  Able to lift leg independently and hold equal to or more than 3 seconds    Total Score  52    Berg comment:  <50% risk for falls; improved form  07/19/17 which was 46/56         TREATMENT: Warm up on Cross trainer, level 3 x3 min (unbilled);  PT instructed patient in 5 times sit<>stand, Berg Balance Assessment, 6 min walk etc to address goals. See above;  Patient able to negotiate 12 steps with 1 rail assist, forward reciprocal independently with good safety awareness and foot placement;  She expressed independence in all home ADLs and community tasks without feeling unsteady including negotiating curbs and shopping;  Patient has signed up for senior games and expressed her biggest limitation is vision and hand/eye coordination; Patient is appropriate for discharge from PT at this time; Recommend she continue with speech and occupational therapy to address memory deficits and hand coordination respectively; patient agreeable;                  PT Education - 08/17/17 1105    Education provided  Yes    Education Details  Progress towards goals, dynamic balance;     Person(s) Educated  Patient    Methods  Explanation;Demonstration;Verbal cues    Comprehension  Verbalized understanding;Returned demonstration;Verbal cues required;Need further instruction       PT Short Term Goals - 08/17/17 1106      PT SHORT TERM GOAL #1   Title  Patient will be independent in home exercise program to improve strength/mobility for better functional independence with ADLs.    Baseline  does them every day;     Time  4    Period  Weeks    Status  Achieved      PT SHORT TERM GOAL #2   Title  Patient (> 40 years old) will complete five times sit to stand test in < 15 seconds indicating an increased LE strength and improved balance.    Baseline  2/6: 15.9 seconds; 3/7: 10.7 sec    Time  4    Period  Weeks    Status  Achieved        PT Long Term Goals - 08/17/17 1106      PT LONG TERM GOAL #1   Title  Patient will increase Berg Balance score by > 6 points (52/56)  to demonstrate decreased fall risk during functional  activities.    Baseline  2/6: 46/56; 08/17/17: 52/56    Time  12    Period  Weeks    Status  Achieved    Target Date  09/25/17      PT LONG TERM GOAL #2   Title  Patient will be independent with ascend/descend 12 steps using single UE in step over step pattern without LOB.    Baseline  unsteady with single limb stance    Time  12    Period  Weeks    Status  Achieved    Target Date  09/25/17      PT LONG TERM GOAL #3   Title  Patient will participate in the Senior Games to allow for return to prior level of function.     Baseline  Patient not able to perform standing balance required of senior games; signed up for games, working on practice    Time  12    Period  Weeks    Status  Partially Met    Target Date  09/25/17      PT LONG TERM GOAL #4   Title  Patient will increase six minute walk test distance to >1500 for progression to age norm ambulator and improve gait ability    Baseline  2/6: 1128; 08/17/17: 1575 feet;     Time  12    Period  Weeks    Status  Achieved    Target Date  09/25/17            Plan - 08/17/17 1140    Clinical Impression Statement  Patient instructed in outcome measures to assess fall risk and progress towards goals. She is doing very well and tested as a lower fall risk. She continues to be impulsive at times, but overall exhibits better safety awareness. Patient has signed up for senior games and will be practicing at the senior center. She is independent in HEP and expressed desire to continue exercising at the senior center/home. Patient is appropriate for discharge from PT at this time as she has met all goals. Patient agreeable.     Rehab Potential  Good    PT Frequency  2x / week    PT Duration  12 weeks    PT Treatment/Interventions  Gait training;Therapeutic exercise;Therapeutic activities;Functional mobility training;Stair training;Balance training;Neuromuscular re-education;Patient/family education;Manual techniques    PT Next Visit Plan   single limb stance, dynamic balance    Consulted and Agree with Plan of Care  Patient       Patient will benefit from skilled therapeutic intervention in order to improve the following deficits and impairments:  Abnormal gait, Decreased balance, Decreased endurance, Decreased mobility, Difficulty walking, Impaired sensation, Decreased strength, Decreased safety awareness, Decreased coordination, Decreased activity tolerance  Visit Diagnosis: Muscle weakness (generalized)  Difficulty in walking, not elsewhere classified  Other lack of coordination     Problem List Patient Active Problem List   Diagnosis Date Noted  . Gait disturbance, post-stroke 07/18/2017  . Cerebral hemorrhage ([place]) 07/11/2017  . Cognitive deficit, post-stroke   . Hyperglycemia   . Hypertension   . Neurologic gait disorder   . Cerebrovascular accident (CVA) due to occlusion of left posterior communicating artery ([place]) 06/20/2017  . Stroke (cerebrum) ([place]) 06/15/2017      Lizzete Gough PT,DPT 08/17/2017, 11:42 AM  [place] MAIN [facility] SERVICES [address] [place], [place], [address] Phone: [phone]   Fax:  [phone]  Name: [name] MRN: [id number] Date of Birth: [birth year]

## 2017-08-18 ENCOUNTER — Ambulatory Visit: Payer: Medicare Other | Admitting: Speech Pathology

## 2017-08-18 ENCOUNTER — Encounter: Payer: Self-pay | Admitting: Speech Pathology

## 2017-08-18 DIAGNOSIS — R41841 Cognitive communication deficit: Secondary | ICD-10-CM

## 2017-08-18 DIAGNOSIS — M6281 Muscle weakness (generalized): Secondary | ICD-10-CM | POA: Diagnosis not present

## 2017-08-18 NOTE — Therapy (Signed)
[facility] MAIN [facility] SERVICES [address] [place], [place], [address] Phone: [phone]   Fax:  [phone]  Speech Language Pathology Treatment  Patient Details  Name: [name] MRN: [id number] Date of Birth: 1935-11-01 Referring Provider: Dr. Lina Sayre   Encounter Date: 08/18/2017  End of Session - 08/18/17 1356    Visit Number  3    Number of Visits  8    Date for SLP Re-Evaluation  09/19/17    SLP Start Time  1100    SLP Stop Time   1150    SLP Time Calculation (min)  50 min    Activity Tolerance  Patient tolerated treatment well       Past Medical History:  Diagnosis Date  . Stroke [facility])     Past Surgical History:  Procedure Laterality Date  . LOOP RECORDER INSERTION N/A 06/20/2017   Procedure: LOOP RECORDER INSERTION;  Surgeon: [name], MD;  [facility];  Service: Cardiovascular;  Laterality: N/A;  . TEE WITHOUT CARDIOVERSION N/A 06/19/2017   Procedure: TRANSESOPHAGEAL ECHOCARDIOGRAM (TEE) WITH LOOP;  Surgeon: Jake Bathe, MD;  Location: [facility];  Service: Cardiovascular;  Laterality: N/A;    There were no vitals filed for this visit.         ADULT SLP TREATMENT - 08/18/17 0001      General Information   Behavior/Cognition  Alert;Cooperative;Pleasant mood;Distractible;Decreased sustained attention    HPI  82 year old female referred for outpatient speech therapy following Left PCA infarct 06/15/17. Pt lives alone, and has assistance from her sister and a friend for shopping. bill pay online. Pt reports using the microwave and eating salads, rarely using the stove/oven to cook. She reports being independent with self med (aspirin and Lipitor 1x/day). She also reports difficulty with short term memory even prior to her CVA.       Treatment Provided   Treatment provided  Cognitive-Linquistic      Pain Assessment   Pain Assessment  No/denies pain      Cognitive-Linquistic Treatment   Treatment focused on  Cognition;Patient/family/caregiver education    Skilled Treatment  ORAGANIZATION: the patient states that she has an organizational system in place- a calendar and designated place to put information to be remembered.  She states that she is remembering as well as she ever did.  MEMORY: Study complex picture and answer questions from memory with 40% accuracy.  Recall 3/5 pictured people after extensive review with SLP.  Recall associated object given cues.  Reproduce 3 figures after review with 20% accuracy.  READING COMPREHENSION: Patient able to read and follow set of 5 instructions to draw picture after extensive review with SLP to understand task.      Assessment / Recommendations / Plan   Plan  Continue with current plan of care      Progression Toward Goals   Progression toward goals  Progressing toward goals       SLP Education - 08/18/17 1354    Education provided  Yes    Education Details  visual imagery to aid recall    Person(s) Educated  Patient    Methods  Explanation    Comprehension  Verbalized understanding         SLP Long Term Goals - 08/08/17 1030      SLP LONG TERM GOAL #1   Title  Pt will demonstrate functional cognitive-communication skills for independent completion of personal responsibilities    Time  8    Period  Weeks    Status  On-going    Target Date  09/22/17      SLP LONG TERM GOAL #2   Title  Pt will complete complex cognitive-linguistic tasks (thought organization, problem solving, reasoning) with 80% accuracy given min assist    Time  8    Period  Weeks    Status  On-going    Target Date  09/22/17      SLP LONG TERM GOAL #3   Title  Pt will demonstrate functional use of external memory aids with 80% accuracy.    Time  8    Period  Weeks    Status  On-going    Target Date  09/22/17       Plan - 08/18/17 1356    Clinical Impression Statement  Pt verbalizes managing home tasks without difficulty. She indicates being  independent with self-medication of aspirin and lipitor (each once a day). Bills are set up online. Her sister and a friend take her shopping for food. She appears to have decreased awareness of her deficits and their functional impact. The patient is stating that she "always had memory problems" and was "a terrible speller".  She was able to benefit from direct teaching to learn a new task.  Continued skilled ST intervention is recommended to maximize cognitive linguistic function for safety and independence.    Speech Therapy Frequency  1x /week    Treatment/Interventions  Cognitive reorganization;SLP instruction and feedback;Internal/external aids;Compensatory strategies;Patient/family education;Multimodal communcation approach;Functional tasks    Potential to Achieve Goals  Good    Potential Considerations  Ability to learn/carryover information;Previous level of function;Family/community support    Consulted and Agree with Plan of Care  Patient       Patient will benefit from skilled therapeutic intervention in order to improve the following deficits and impairments:   Cognitive communication deficit    Problem List Patient Active Problem List   Diagnosis Date Noted  . Gait disturbance, post-stroke 07/18/2017  . Cerebral hemorrhage (HCC) 07/11/2017  . Cognitive deficit, post-stroke   . Hyperglycemia   . Hypertension   . Neurologic gait disorder   . Cerebrovascular accident (CVA) due to occlusion of left posterior communicating artery (HCC) 06/20/2017  . Stroke (cerebrum) (HCC) 06/15/2017   Dollene PrimroseSusan G Nicole Hafley, MS/CCC- SLP  Leandrew KoyanagiAbernathy, [name] 08/18/2017, 1:58 PM  [facility] MAIN [facility] SERVICES [address] [place], [place], [address] Phone: [phone]   Fax:  [phone]   Name: Angela SheererReva R Gilbert MRN: [id number] Date of Birth: 11/05/1935

## 2017-08-22 ENCOUNTER — Ambulatory Visit (INDEPENDENT_AMBULATORY_CARE_PROVIDER_SITE_OTHER): Payer: Medicare Other | Admitting: *Deleted

## 2017-08-22 ENCOUNTER — Ambulatory Visit: Payer: Medicare Other | Admitting: Physical Therapy

## 2017-08-22 ENCOUNTER — Telehealth: Payer: Self-pay | Admitting: Cardiology

## 2017-08-22 ENCOUNTER — Encounter: Payer: Self-pay | Admitting: Occupational Therapy

## 2017-08-22 ENCOUNTER — Ambulatory Visit: Payer: Medicare Other | Admitting: Occupational Therapy

## 2017-08-22 DIAGNOSIS — I6329 Cerebral infarction due to unspecified occlusion or stenosis of other precerebral arteries: Secondary | ICD-10-CM | POA: Diagnosis not present

## 2017-08-22 DIAGNOSIS — R278 Other lack of coordination: Secondary | ICD-10-CM

## 2017-08-22 DIAGNOSIS — M6281 Muscle weakness (generalized): Secondary | ICD-10-CM

## 2017-08-22 NOTE — Therapy (Signed)
[place] MAIN [facility] SERVICES [address] [place], [place], [address] Phone: [phone]   Fax:  [phone]  Occupational Therapy Treatment/Discharge Note  Patient Details  Name: [name] MRN: [id number] Date of Birth: 03/13/1936 Referring Provider: Cathlyn Parsons   Encounter Date: 08/22/2017  OT End of Session - 08/22/17 0939    Visit Number  9    Number of Visits  34    Date for OT Re-Evaluation  09/25/17    OT Start Time  0845    OT Stop Time  0930    OT Time Calculation (min)  45 min    Activity Tolerance  Treatment limited secondary to medical complications (Comment)    Behavior During Therapy  [facility] for tasks assessed/performed       Past Medical History:  Diagnosis Date  . Stroke [facility])     Past Surgical History:  Procedure Laterality Date  . LOOP RECORDER INSERTION N/A 06/20/2017   Procedure: LOOP RECORDER INSERTION;  Surgeon: Thompson Grayer, MD;  Location: [place] CV LAB;  Service: Cardiovascular;  Laterality: N/A;  . TEE WITHOUT CARDIOVERSION N/A 06/19/2017   Procedure: TRANSESOPHAGEAL ECHOCARDIOGRAM (TEE) WITH LOOP;  Surgeon: Jerline Pain, MD;  [facility] ENDOS[facility];  Service: Cardiovascular;  Laterality: N/A;    There were no vitals filed for this visit.  Subjective Assessment - 08/22/17 0911    Subjective   Pt. reports she is practicing at the senior games.    Patient is accompained by:  Family member    Pertinent History  Pt. is a 82 y.o. 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 Pt. received TPA. Pt. presented to the hospital with dizziness, right sided weakness, slurred speech, and blurred vision. Pt. PMHx includes: Hyperlipidemia, HTN, GERD, and Constipation.     Currently in Pain?  No/denies         [facility] OT Assessment - 08/22/17 0001      Coordination   Right 9 Hole Peg Test  26      Strength   Overall Strength Comments  BUE strength 5/5 overall.      Hand  Function   Right Hand Lateral Pinch  13 lbs    Right Hand 3 Point Pinch  15 lbs        OT TREATMENT    Therapeutic Exercise:  Pt. Performed hand strengthening with green theraputty. Pt. required cues for proper technique. Pt. worked on gross grip loop, lateral pinch, 3pt. pinch, gross digit extension, digit extension table spread, digit abduction loop, single digit extension loop, thumb opposition, and lumbical ex,  Pt. required verbal and tactile cues for proper technique.  Selfcare:  Pt. Education was provided about visual, and cognitive compensatory strategies. Pt. Reports she is missing her targets by a lot duirng cornhole. Pt. Education was provided about visual compensatory strategies. Pt. Education was provided about visual, and cognitive compensatory strategies, and tabletop ADL/IADL tasks, and navigating within her environment secondary to vision.                      OT Education - 08/22/17 0914    Education provided  Yes    Education Details  green theraputty, UE strength, and coordination skills. Visual compensatory strategies.    Person(s) Educated  Patient    Methods  Explanation    Comprehension  Verbalized understanding          OT Long Term  Goals - 08/22/17 0915      OT LONG TERM GOAL #1   Title  Pt. will independently demonstrate visual scanning and visual search strategies to be able to navigate within her environment during ADL, and IADL tasks.    Baseline  Pt. has difficulty    Time  12    Period  Weeks    Status  Partially Met    Target Date  09/25/17      OT LONG TERM GOAL #2   Title  Pt. will independently demonstrate visual scanning, and visual search strategies to be able to complete paperwork, and forms.    Baseline  Pt. has difficulty    Time  12    Period  Weeks    Status  Partially Met    Target Date  09/25/17      OT LONG TERM GOAL #3   Title  Pt. will indepndently demonstrate visual compensatory strategies during ADLs,  and IADLs.    Baseline  Pt. has difficulty    Time  12    Period  Weeks    Status  Partially Met    Target Date  09/25/17      OT LONG TERM GOAL #4   Title  Pt. will independently demonstrate cognitive compensatory strategies during ADLs, and IADLs.    Baseline  Pt. has difficulty    Period  Weeks    Status  Partially Met    Target Date  09/25/17      OT LONG TERM GOAL #5   Title  Pt. will increase RUE strength to improve engagement in ADLs, and IADLs.    Baseline  decreased RUE strength    Time  12    Period  Weeks      OT LONG TERM GOAL #6   Title  Pt. will increase right lateral pinch strength by 2# to assist with ADLs.    Baseline  Decreased strength    Time  12    Period  Weeks    Target Date  09/25/17            Plan - 08/22/17 0940    Clinical Impression Statement Pt. reports she is signed up for 8 senior games, and will not be able to do both the senior games, and therapy. Pt. reports she has finished PT, and ST, and is now finishing with OT. Pt. reports she is now also turning in her heart monitor. Pt. has made progress, however would continue to benefit from skilled OT services. Pt. education was provided about pt. current functional status, and visual perceptual functioning. Pt. is encouraged to continue using visual compensatory strategies during ADLs, and IADLs. Pt. was advised not to drive, or operate a lawn mower until she has been cleared by her physician to do so. Pt. is now being discharged at this time per her request.    Occupational performance deficits (Please refer to evaluation for details):  ADL's    OT Frequency  2x / week    OT Duration  12 weeks    OT Treatment/Interventions  Self-care/ADL training;Therapeutic exercise;Patient/family education;Cognitive remediation/compensation;Therapeutic activities;Visual/perceptual remediation/compensation    Plan  Assess visual to determine how it may be affecting ADL, and IADL functioning.    Clinical Decision  Making  Several treatment options, min-mod task modification necessary    Consulted and Agree with Plan of Care  Patient       Patient will benefit from skilled therapeutic intervention in order to improve  the following deficits and impairments:  Decreased strength, Impaired UE functional use, Impaired vision/preception, Decreased cognition, Decreased knowledge of precautions, Decreased safety awareness, Decreased activity tolerance, Decreased coordination  Visit Diagnosis: Other lack of coordination  Muscle weakness (generalized)    Problem List Patient Active Problem List   Diagnosis Date Noted  . Gait disturbance, post-stroke 07/18/2017  . Cerebral hemorrhage ([place]) 07/11/2017  . Cognitive deficit, post-stroke   . Hyperglycemia   . Hypertension   . Neurologic gait disorder   . Cerebrovascular accident (CVA) due to occlusion of left posterior communicating artery ([place]) 06/20/2017  . Stroke (cerebrum) ([place]) 06/15/2017    Harrel Carina, MS, OTR/L 08/22/2017, 9:57 AM  [place] MAIN [facility] SERVICES [address] [place], [place], [address] Phone: [phone]   Fax:  [phone]  Name: [name] MRN: [id number] Date of Birth: 1935/07/19

## 2017-08-22 NOTE — Telephone Encounter (Signed)
Lorine Bearsonnie Mullins (DPR on file) called and stated that pt wants to discontinue remote monitoring because it is no use to her. I informed Mrs. Sabino GasserMullins that pt had the loop recorder implanted for cryptogenic stroke. I informed Mrs. Sabino GasserMullins that we are looking for afib and that if the home monitor is not hooked up we will not know if the pt has atrial fib and this can put her at risk for another stroke. I informed Mrs. Sabino GasserMullins that pt can unplug the monitor and take it with her if she plans to travel. I informed Mrs. Sabino GasserMullins that the way our system works we have to schedule the monthly summary reports on a date and time but the time is irrelevant. That the monitor sends the information automatically b/w 12 AM - 5 AM. Informed Mrs. Sabino GasserMullins that I would send MD a note for recommendations. Junious DresserConnie believes that pt is set on the decision to stop following the loop recorder b/c pt told her that the monitor is no use to her.

## 2017-08-22 NOTE — Progress Notes (Signed)
[facility] Summary Report / Loop Recorder 

## 2017-08-23 ENCOUNTER — Telehealth: Payer: Self-pay | Admitting: *Deleted

## 2017-08-23 DIAGNOSIS — I480 Paroxysmal atrial fibrillation: Secondary | ICD-10-CM | POA: Insufficient documentation

## 2017-08-23 MED ORDER — APIXABAN 5 MG PO TABS: 5.0000 mg | ORAL_TABLET | Freq: Two times a day (BID) | ORAL | 0 refills | Status: DC

## 2017-08-23 MED ORDER — APIXABAN 5 MG PO TABS: 5.0000 mg | ORAL_TABLET | Freq: Two times a day (BID) | ORAL | 10 refills | Status: DC

## 2017-08-23 NOTE — Telephone Encounter (Addendum)
Spoke with patient again as I am unable to reach [place].  She reports that Junious DresserConnie will know which pharmacy I should send the prescription to.  Patient gave me Connie's mobile number, but no answer and no VM.  Will continue trying to reach [place].  Discussed with patient the importance of keeping her Carelink monitor plugged-in and transmitting.  She plugged it back in last night and agrees to keep it transmitting for now.

## 2017-08-23 NOTE — Telephone Encounter (Signed)
Junious Dresseronnie called via [facility].  Advised that the samples are sitting at the front desk at the [facility] office, along with a 30-day free trial card.  Junious DresserConnie is aware of instructions to STOP ASA and START Eliquis 5mg  BID.  She is unsure where to send the prescription, but will contact the patient's daughter to confirm patient's established pharmacy and call me back.  She is also agreeable to an AF Clinic appointment in the next few weeks, but will have to get home and call back to setup appointment.  She denies additional questions at this time.

## 2017-08-23 NOTE — Telephone Encounter (Signed)
Spoke with patient regarding new PAF noted on LINQ from 08/20/17 (transmitted on 08/23/17).  Patient is agreeable to Dr. Johney FrameAllred and Dr. Marlis EdelsonSethi's recommendation to STOP ASA and START Eliquis 5mg  BID.  Patient lives in [place] and her sister-in-law, Junious DresserConnie, would be able to pick up samples and a trial card for her at the [place] office.  Advised I will contact the [place] office to determine if they have samples available.  Patient is agreeable.   Patient is also agreeable to an appointment in the AF Clinic, but will not have a ride to [place] available until next week.  She requests that I discuss an appointment date/time with Junious Dresseronnie.

## 2017-08-23 NOTE — Telephone Encounter (Signed)
Spoke with patient and Junious DresserConnie.  Junious DresserConnie picked up samples and trial card.  Patient read the side effects insert included with the medication samples and is not sure that she wants to take Eliquis.  Patient and Junious DresserConnie verbalize understanding that this is Dr. Johney FrameAllred and Dr. Marlis EdelsonSethi's recommendation, but patient reports she is going to wait to discuss her concerns with a provider prior to starting it.  Able to schedule patient with Rudi Cocoonna Carroll, NP, on 08/25/17 at 9:00am.  Junious DresserConnie is aware of date/time and parking instructions and will bring patient to this appointment.  30-day trial prescription and full prescription sent to patient's preferred pharmacy.

## 2017-08-23 NOTE — Telephone Encounter (Signed)
See encounter from 08/23/17.  Patient agrees to keep Carelink monitor plugged-in and transmitting for now.

## 2017-08-23 NOTE — Telephone Encounter (Signed)
Pt calling back. She stated that her sister is with her. Wants you to call her back when you can.

## 2017-08-24 ENCOUNTER — Ambulatory Visit: Payer: Medicare Other | Admitting: Physical Therapy

## 2017-08-25 ENCOUNTER — Ambulatory Visit: Payer: Medicare Other | Admitting: Physical Therapy

## 2017-08-25 ENCOUNTER — Encounter: Payer: Medicare Other | Admitting: Speech Pathology

## 2017-08-25 ENCOUNTER — Encounter (HOSPITAL_COMMUNITY): Payer: Self-pay | Admitting: Nurse Practitioner

## 2017-08-25 ENCOUNTER — Ambulatory Visit (HOSPITAL_COMMUNITY)
Admission: RE | Admit: 2017-08-25 | Discharge: 2017-08-25 | Disposition: A | Discharge status: Home / Self Care | Payer: Medicare Other | Source: Ambulatory Visit | Attending: Nurse Practitioner | Admitting: Nurse Practitioner

## 2017-08-25 VITALS — Ht 63.0 in | Wt 131.0 lb

## 2017-08-25 DIAGNOSIS — Z8673 Personal history of transient ischemic attack (TIA), and cerebral infarction without residual deficits: Secondary | ICD-10-CM | POA: Insufficient documentation

## 2017-08-25 DIAGNOSIS — Z79899 Other long term (current) drug therapy: Secondary | ICD-10-CM | POA: Diagnosis not present

## 2017-08-25 DIAGNOSIS — I4891 Unspecified atrial fibrillation: Secondary | ICD-10-CM | POA: Diagnosis present

## 2017-08-25 DIAGNOSIS — I480 Paroxysmal atrial fibrillation: Secondary | ICD-10-CM

## 2017-08-25 NOTE — Progress Notes (Signed)
Primary Care Physician: Leotis Shames, MD Referring Physician:Dr. Allred Neurologist: Dr. Shawn Stall is a 82 y.o. female with a h/o cryptogenic stroke in January 2019 and received a Linq, otherwise very healthy and active for her age.. Monitor recently showed paroxysmal afib and pt was advised to stop ASA and start Eliquis 5 mg bid by Dr. Lewie Loron. She is in the afib clinic for f/u. She has received samples of eliquis and brings in the bag of eliquis but has not started, she leads off the appointment declaring" I am not going to take ASA or Eliquis, if I die, I die."  She points to a quarter size bruise on her rt forearm that came from ASA and is fearful that she will bruise a lot more from DOAC.She states that she has a very active lifestyle participating in Senior games, is out of the house all day long and she is convinced that taking 2 pills a day will burden her and change her lifestyle. She asks, " have you read all the side effects from this drug?" She only takes cholesterol med now, and is fearful that the drug will be more money than she cares to spend.  Today, she denies symptoms of palpitations, chest pain, shortness of breath, orthopnea, PND, lower extremity edema, dizziness, presyncope, syncope, or neurologic sequela. The patient is tolerating medications without difficulties and is otherwise without complaint today.   Past Medical History:  Diagnosis Date  . Stroke [facility])    Past Surgical History:  Procedure Laterality Date  . LOOP RECORDER INSERTION N/A 06/20/2017   Procedure: LOOP RECORDER INSERTION;  Surgeon: [name], MD;  [facility] INV[facility] CV LAB;  Service: Cardiovascular;  Laterality: N/A;  . TEE WITHOUT CARDIOVERSION N/A 06/19/2017   Procedure: TRANSESOPHAGEAL ECHOCARDIOGRAM (TEE) WITH LOOP;  Surgeon: Jake Bathe, MD;  Location: [facility] ENDOSCOPY;  Service: Cardiovascular;  Laterality: N/A;    Current Outpatient Medications  Medication Sig Dispense  Refill  . atorvastatin (LIPITOR) 20 MG tablet Take 1 tablet (20 mg total) by mouth daily at 6 PM. 30 tablet 0   No current facility-administered medications for this encounter.     No Known Allergies  Social History   Socioeconomic History  . Marital status: Widowed    Spouse name: Not on file  . Number of children: Not on file  . Years of education: Not on file  . Highest education level: Not on file  Social Needs  . Financial resource strain: Not on file  . Food insecurity - worry: Not on file  . Food insecurity - inability: Not on file  . Transportation needs - medical: Not on file  . Transportation needs - non-medical: Not on file  Occupational History  . Not on file  Tobacco Use  . Smoking status: Never Smoker  . Smokeless tobacco: Never Used  . Tobacco comment: None  Substance and Sexual Activity  . Alcohol use: No    Frequency: Never    Comment: None  . Drug use: No    Comment: None  . Sexual activity: No  Other Topics Concern  . Not on file  Social History Narrative  . Not on file    Family History  Problem Relation Age of Onset  . Heart attack Father   . Breast cancer Sister   . Breast cancer Brother     ROS- All systems are reviewed and negative except as per the HPI above  Physical Exam: Vitals:  08/25/17 0849  Weight: 131 lb (59.4 kg)  Height: 5\' 3"  (1.6 m)   Wt Readings from Last 3 Encounters:  08/25/17 131 lb (59.4 kg)  07/11/17 134 lb (60.8 kg)  06/28/17 134 lb 7.7 oz (61 kg)    Labs: Lab Results  Component Value Date   NA 140 07/12/2017   K 4.1 07/12/2017   CL 108 07/12/2017   CO2 25 07/12/2017   GLUCOSE 104 (H) 07/12/2017   BUN 16 07/12/2017   CREATININE 0.80 07/12/2017   CALCIUM 9.5 07/12/2017   Lab Results  Component Value Date   INR 0.95 07/11/2017   Lab Results  Component Value Date   CHOL 213 (H) 06/16/2017   HDL 80 06/16/2017   LDLCALC 120 (H) 06/16/2017   TRIG 65 06/16/2017     GEN- The patient is well  appearing, alert and oriented x 3 today.   Head- normocephalic, atraumatic Eyes-  Sclera clear, conjunctiva pink Ears- hearing intact Oropharynx- clear Neck- supple, no JVP Lymph- no cervical lymphadenopathy Lungs- Clear to ausculation bilaterally, normal work of breathing Heart- Regular rate and rhythm, no murmurs, rubs or gallops, PMI not laterally displaced GI- soft, NT, ND, + BS Extremities- no clubbing, cyanosis, or edema MS- no significant deformity or atrophy Skin- no rash or lesion Psych- euthymic mood, full affect Neuro- strength and sensation are intact  EKG- Sinus rhythm with one PAC, v rate 77 bpm, qrs int 78 ms, qtc 416 ms Epic records reviewed    Assessment and Plan: 1. Paroxysmal afib in the setting of a CVA in 06/2017 Chadsvasc score is 5 Bleeding precautions discussed I tried my best to reassure pt that the drug is safe with a low bleeding risk profile and that she is high risk for another stroke with the dx of afib and not on anticoagulation She listened but is very feisty and I am not sure if I convinced her to take the drug but I gave it my best argument. She will check on the price of the drug and has enough samples to last  until she sees Dr. Pearlean BrownieSethi 4/23 If she does start drug in 6 months she will turn 82 and with body weight of 59.4 kg, she will then need 2.5 mg eliquis bid Of course now , she is also off ASA and does not want to restart this either  If on drug with Dr. Pearlean BrownieSethi, would recommend CBC as she has slightly low plts at baseline, she lives in [place] and it would be hard to come back here.  Elvina Sidleonna C. Matthew Folksarroll, ANP-C Afib Clinic [facility] [address] [place], [place] [phone]

## 2017-08-29 ENCOUNTER — Encounter: Payer: Medicare Other | Admitting: Occupational Therapy

## 2017-08-29 ENCOUNTER — Encounter: Payer: Medicare Other | Admitting: Speech Pathology

## 2017-08-29 ENCOUNTER — Ambulatory Visit: Payer: Medicare Other | Admitting: Physical Therapy

## 2017-08-31 ENCOUNTER — Ambulatory Visit: Payer: Medicare Other | Admitting: Physical Therapy

## 2017-08-31 ENCOUNTER — Encounter: Payer: Medicare Other | Admitting: Speech Pathology

## 2017-08-31 ENCOUNTER — Encounter: Payer: Medicare Other | Admitting: Occupational Therapy

## 2017-09-04 ENCOUNTER — Encounter: Payer: Medicare Other | Admitting: Speech Pathology

## 2017-09-04 ENCOUNTER — Encounter: Payer: Medicare Other | Admitting: Occupational Therapy

## 2017-09-04 ENCOUNTER — Ambulatory Visit: Payer: Medicare Other | Admitting: Physical Therapy

## 2017-09-05 ENCOUNTER — Encounter: Payer: Medicare Other | Admitting: Speech Pathology

## 2017-09-06 ENCOUNTER — Encounter: Payer: Medicare Other | Admitting: Speech Pathology

## 2017-09-06 ENCOUNTER — Encounter: Payer: Medicare Other | Admitting: Occupational Therapy

## 2017-09-06 ENCOUNTER — Ambulatory Visit: Payer: Medicare Other | Admitting: Physical Therapy

## 2017-09-06 ENCOUNTER — Other Ambulatory Visit: Payer: Self-pay | Admitting: Internal Medicine

## 2017-09-12 ENCOUNTER — Ambulatory Visit: Payer: Medicare Other | Admitting: Speech Pathology

## 2017-09-12 ENCOUNTER — Ambulatory Visit: Payer: Medicare Other | Admitting: Physical Therapy

## 2017-09-15 ENCOUNTER — Ambulatory Visit: Payer: Medicare Other | Admitting: Physical Therapy

## 2017-09-19 ENCOUNTER — Ambulatory Visit: Payer: Medicare Other | Admitting: Physical Therapy

## 2017-09-19 ENCOUNTER — Encounter: Payer: Medicare Other | Admitting: Speech Pathology

## 2017-09-19 ENCOUNTER — Encounter: Payer: Medicare Other | Admitting: Occupational Therapy

## 2017-09-21 ENCOUNTER — Encounter: Payer: Medicare Other | Admitting: Occupational Therapy

## 2017-09-21 ENCOUNTER — Ambulatory Visit: Payer: Medicare Other | Admitting: Physical Therapy

## 2017-09-25 ENCOUNTER — Encounter: Payer: Medicare Other | Admitting: Occupational Therapy

## 2017-09-25 ENCOUNTER — Encounter: Payer: Medicare Other | Admitting: Speech Pathology

## 2017-09-25 ENCOUNTER — Ambulatory Visit (INDEPENDENT_AMBULATORY_CARE_PROVIDER_SITE_OTHER): Payer: Medicare Other | Admitting: *Deleted

## 2017-09-25 ENCOUNTER — Other Ambulatory Visit: Payer: Self-pay

## 2017-09-25 ENCOUNTER — Ambulatory Visit: Payer: Medicare Other | Admitting: Physical Therapy

## 2017-09-25 DIAGNOSIS — I6329 Cerebral infarction due to unspecified occlusion or stenosis of other precerebral arteries: Secondary | ICD-10-CM

## 2017-09-26 ENCOUNTER — Other Ambulatory Visit: Payer: Self-pay

## 2017-09-26 NOTE — Progress Notes (Signed)
[facility] Summary Report / Loop Recorder 

## 2017-09-28 ENCOUNTER — Encounter: Payer: Medicare Other | Admitting: Occupational Therapy

## 2017-09-28 ENCOUNTER — Other Ambulatory Visit: Payer: Self-pay

## 2017-09-28 ENCOUNTER — Encounter: Payer: Medicare Other | Admitting: Speech Pathology

## 2017-09-28 ENCOUNTER — Ambulatory Visit: Payer: Medicare Other

## 2017-09-29 ENCOUNTER — Telehealth: Payer: Self-pay | Admitting: *Deleted

## 2017-09-29 NOTE — Telephone Encounter (Signed)
LMOVM requesting call back to the Device Clinic.    Patient continues to have AF noted on LINQ.  Per notes, patient cancelled her appointment with Dr. Pearlean BrownieSethi on 4/23 because she is "much better."  Patient was reluctant to start Eliquis at AF clinic appointment on 08/25/17 per note.  Reviewed with Dr. Graciela HusbandsKlein, who offered to see patient in [place] next week to discuss OAC.  Will offer appointment.

## 2017-10-02 ENCOUNTER — Encounter: Payer: Medicare Other | Admitting: Occupational Therapy

## 2017-10-02 ENCOUNTER — Encounter: Payer: Medicare Other | Admitting: Speech Pathology

## 2017-10-02 LAB — CUP PACEART REMOTE DEVICE CHECK
Implantable Pulse Generator Implant Date: 20190108
MDC IDC SESS DTM: 20190312180936

## 2017-10-03 ENCOUNTER — Ambulatory Visit: Payer: Self-pay | Admitting: Neurology

## 2017-10-03 ENCOUNTER — Encounter: Payer: Medicare Other | Admitting: Speech Pathology

## 2017-10-03 NOTE — Telephone Encounter (Signed)
Able to reach patient.  She reports that she never started Eliquis, despite her extensive discussion with Rudi Cocoonna Carroll, NP, on 08/25/17.  Patient is agreeable to an appointment with Dr. Graciela [facility]bandsKlein in [facility] to discuss risks/benefits of Eliquis/alternative [facility] as this office is convenient to her home.  Her sister brings her to appointments and volunteers at [facility] per patient.  Patient is aware of appointment on 10/05/17 at 11:00am.  She is appreciative of call and denies additional questions at this time.

## 2017-10-05 ENCOUNTER — Ambulatory Visit (INDEPENDENT_AMBULATORY_CARE_PROVIDER_SITE_OTHER): Payer: Medicare Other | Admitting: Internal Medicine

## 2017-10-05 ENCOUNTER — Encounter: Payer: Medicare Other | Admitting: Occupational Therapy

## 2017-10-05 ENCOUNTER — Encounter: Payer: Self-pay | Admitting: Internal Medicine

## 2017-10-05 VITALS — BP 150/82 | HR 75 | Ht 63.0 in | Wt 126.2 lb

## 2017-10-05 DIAGNOSIS — I480 Paroxysmal atrial fibrillation: Secondary | ICD-10-CM

## 2017-10-05 DIAGNOSIS — I639 Cerebral infarction, unspecified: Secondary | ICD-10-CM | POA: Diagnosis not present

## 2017-10-05 DIAGNOSIS — Z959 Presence of cardiac and vascular implant and graft, unspecified: Secondary | ICD-10-CM

## 2017-10-05 NOTE — Patient Instructions (Signed)
Medication Instructions: - Your physician recommends that you continue on your current medications as directed. Please refer to the Current Medication list given to you today.  Labwork: - none ordered  Procedures/Testing: - none ordered  Follow-Up: - as needed  Any Additional Special Instructions Will Be Listed Below (If Applicable).     If you need a refill on your cardiac medications before your next appointment, please call your pharmacy.   

## 2017-10-05 NOTE — Progress Notes (Signed)
      Patient Care Team: Leotis ShamesSingh, Jasmine, MD as PCP - General (Internal Medicine)   HPI  Cecile SheererReva R Gilbert is a 82 y.o. female Seen as an add-on today.  She has a history of cryptogenic stroke and was noted to have atrial fibrillation on her monitor.  It was recommended that she start on an anticoagulant.  She was disinclined.  She is here today to review, with her daughter present, recommendations regarding anticoagulation.  Records and Results Reviewed outpt records and hospital reports   Past Medical History:  Diagnosis Date  . Hyperlipidemia   . Stroke [facility](H[facility])     Past Surgical History:  Procedure Laterality Date  . LOOP RECORDER INSERTION N/A 06/20/2017   Procedure: LOOP RECORDER INSERTION;  Surgeon: Hillis RangeAllred, James, MD;  [facility];  Service: Cardiovascular;  Laterality: N/A;  . TEE WITHOUT CARDIOVERSION N/A 06/19/2017   Procedure: TRANSESOPHAGEAL ECHOCARDIOGRAM (TEE) WITH LOOP;  Surgeon: Jake BatheSkains, Mark C, MD;  [facility] ENDOS[facility];  Service: Cardiovascular;  Laterality: N/A;    Current Meds  Medication Sig  . aspirin 81 MG tablet Take 81 mg by mouth daily.  Marland Kitchen. atorvastatin (LIPITOR) 20 MG tablet Take 1 tablet (20 mg total) by mouth daily at 6 PM.    No Known Allergies    Review of Systems negative except from HPI and PMH  Physical Exam BP (!) 150/82 (BP Location: Left Arm, Patient Position: Sitting, Cuff Size: Normal)   Pulse 75   Ht 5\' 3"  (1.6 m)   Wt 126 lb 4 oz (57.3 kg)   BMI 22.36 kg/m  Well developed and well nourished in no acute distress HENT normal E scleral and icterus clear Neck Supple JVP flat; carotids brisk and full Clear to ausculation Regular rate and rhythm, no murmurs gallops or rub Soft with active bowel sounds No clubbing cyanosis  Edema Alert and oriented, grossly normal motor and sensory function Skin Warm and Dry    Assessment and  Plan  Cryptogenic Stroke  Atrial fib patient and I and her daughter had an extensive  discussion regarding the risks of recurrent stroke in the setting of a CHADS-VASc score of greater than or equal to 6.  We also reviewed the data on the mortality of atrial fibrillation related strokes both in 30 days and at 6 months stressing that at both timeframes even at the latter it was less than 50% and the concerns were not that she was stroking.hypertension stroke and be disabled.  She reiterated to me and her daughter she was not interested.  In this context, there are no data to support the use of aspirin.  I suggested she discontinue it.  I encouraged her to continue taking her statin  We spent more than 50% of our >25 min visit in face to face counseling regarding the above     Current medicines are reviewed at length with the patient today .  The patient does have concerns regarding medicines As above

## 2017-10-10 ENCOUNTER — Encounter: Payer: Medicare Other | Admitting: Speech Pathology

## 2017-10-10 ENCOUNTER — Encounter: Payer: Medicare Other | Admitting: Occupational Therapy

## 2017-10-12 ENCOUNTER — Encounter: Payer: Medicare Other | Admitting: Occupational Therapy

## 2017-10-18 ENCOUNTER — Telehealth: Payer: Self-pay | Admitting: Cardiology

## 2017-10-18 NOTE — Telephone Encounter (Signed)
Spoke w/ pt and requested that she send a manual transmission b/c her home monitor has not updated in at least 14 days.   

## 2017-10-19 ENCOUNTER — Other Ambulatory Visit: Payer: Self-pay | Admitting: Internal Medicine

## 2017-10-25 LAB — CUP PACEART REMOTE DEVICE CHECK
Date Time Interrogation Session: 20190414180757
MDC IDC PG IMPLANT DT: 20190108

## 2017-10-27 ENCOUNTER — Ambulatory Visit (INDEPENDENT_AMBULATORY_CARE_PROVIDER_SITE_OTHER): Payer: Medicare Other | Admitting: *Deleted

## 2017-10-27 DIAGNOSIS — I639 Cerebral infarction, unspecified: Secondary | ICD-10-CM | POA: Diagnosis not present

## 2017-10-30 NOTE — Progress Notes (Signed)
[facility] Summary Report / Loop Recorder 

## 2017-11-10 ENCOUNTER — Telehealth: Payer: Self-pay | Admitting: *Deleted

## 2017-11-10 NOTE — Telephone Encounter (Signed)
LMOM requesting call back to the Device Clinic.  Per Dr. Graciela HusbandsKlein, ok to d/c Carelink monitoring if patient and daughter are agreeable.

## 2017-11-17 LAB — CUP PACEART REMOTE DEVICE CHECK
Date Time Interrogation Session: 20190517180554
MDC IDC PG IMPLANT DT: 20190108

## 2017-11-17 NOTE — Telephone Encounter (Signed)
Spoke with patient, who still wishes to d/c Carelink monitoring.  She will plan to unplug home monitor.  Unenrolled from [employer], return kit ordered to home address on file.  Patient is agreeable to me making her daughter aware of this as well.  Advised return kit should arrive in 4-6 weeks.  Patient verbalizes understanding and denies questions or concerns at this time.  Spoke with patient's daughter.  She is in agreement with plan, states that Dr. Caryl Comes discussed this with both her and the patient at her OV.  Advised return kit should arrive in 4-6 weeks, but to call if it is not received.  Olivia Mackie verbalizes understanding and denies questions or concerns at this time.

## 2018-06-22 ENCOUNTER — Emergency Department
Admission: EM | Admit: 2018-06-22 | Discharge: 2018-06-22 | Disposition: A | Discharge status: Home / Self Care | Payer: Medicare Other | Attending: Emergency Medicine | Admitting: Emergency Medicine

## 2018-06-22 ENCOUNTER — Other Ambulatory Visit: Payer: Self-pay

## 2018-06-22 ENCOUNTER — Emergency Department: Payer: Medicare Other

## 2018-06-22 DIAGNOSIS — Z7982 Long term (current) use of aspirin: Secondary | ICD-10-CM | POA: Insufficient documentation

## 2018-06-22 DIAGNOSIS — N390 Urinary tract infection, site not specified: Secondary | ICD-10-CM

## 2018-06-22 DIAGNOSIS — H539 Unspecified visual disturbance: Secondary | ICD-10-CM | POA: Diagnosis not present

## 2018-06-22 DIAGNOSIS — I10 Essential (primary) hypertension: Secondary | ICD-10-CM | POA: Insufficient documentation

## 2018-06-22 DIAGNOSIS — Z79899 Other long term (current) drug therapy: Secondary | ICD-10-CM | POA: Diagnosis not present

## 2018-06-22 LAB — URINALYSIS, ROUTINE W REFLEX MICROSCOPIC
Bilirubin Urine: NEGATIVE
Glucose, UA: NEGATIVE mg/dL
Hgb urine dipstick: NEGATIVE
Ketones, ur: 5 mg/dL — AB
Nitrite: POSITIVE — AB
Protein, ur: NEGATIVE mg/dL
SPECIFIC GRAVITY, URINE: 1.012 (ref 1.005–1.030)
pH: 7 (ref 5.0–8.0)

## 2018-06-22 LAB — URINE DRUG SCREEN, QUALITATIVE (ARMC ONLY)
Amphetamines, Ur Screen: NOT DETECTED
BARBITURATES, UR SCREEN: NOT DETECTED
Benzodiazepine, Ur Scrn: NOT DETECTED
CANNABINOID 50 NG, UR ~~LOC~~: NOT DETECTED
COCAINE METABOLITE, UR ~~LOC~~: NOT DETECTED
MDMA (ECSTASY) UR SCREEN: NOT DETECTED
Methadone Scn, Ur: NOT DETECTED
Opiate, Ur Screen: NOT DETECTED
Phencyclidine (PCP) Ur S: NOT DETECTED
Tricyclic, Ur Screen: NOT DETECTED

## 2018-06-22 LAB — COMPREHENSIVE METABOLIC PANEL
ALT: 21 U/L (ref 0–44)
AST: 24 U/L (ref 15–41)
Albumin: 4.4 g/dL (ref 3.5–5.0)
Alkaline Phosphatase: 74 U/L (ref 38–126)
Anion gap: 6 (ref 5–15)
BUN: 16 mg/dL (ref 8–23)
CO2: 26 mmol/L (ref 22–32)
CREATININE: 0.75 mg/dL (ref 0.44–1.00)
Calcium: 9.4 mg/dL (ref 8.9–10.3)
Chloride: 106 mmol/L (ref 98–111)
GFR calc Af Amer: >60 mL/min (ref >60)
GFR calc non Af Amer: >60 mL/min (ref >60)
Glucose, Bld: 108 mg/dL — ABNORMAL HIGH (ref 70–99)
Potassium: 4 mmol/L (ref 3.5–5.1)
SODIUM: 138 mmol/L (ref 135–145)
Total Bilirubin: 1 mg/dL (ref 0.3–1.2)
Total Protein: 6.8 g/dL (ref 6.5–8.1)

## 2018-06-22 LAB — CBC
HCT: 40.8 % (ref 36.0–46.0)
Hemoglobin: 13.9 g/dL (ref 12.0–15.0)
MCH: 33.1 pg (ref 26.0–34.0)
MCHC: 34.1 g/dL (ref 30.0–36.0)
MCV: 97.1 fL (ref 80.0–100.0)
Platelets: 137 10*3/uL — ABNORMAL LOW (ref 150–400)
RBC: 4.2 MIL/uL (ref 3.87–5.11)
RDW: 11.9 % (ref 11.5–15.5)
WBC: 4.3 10*3/uL (ref 4.0–10.5)
nRBC: 0 % (ref 0.0–0.2)

## 2018-06-22 LAB — DIFFERENTIAL
Abs Immature Granulocytes: 0.01 10*3/uL (ref 0.00–0.07)
Basophils Absolute: 0 10*3/uL (ref 0.0–0.1)
Basophils Relative: 1 %
Eosinophils Absolute: 0 10*3/uL (ref 0.0–0.5)
Eosinophils Relative: 1 %
Immature Granulocytes: 0 %
Lymphocytes Relative: 27 %
Lymphs Abs: 1.2 10*3/uL (ref 0.7–4.0)
MONO ABS: 0.3 10*3/uL (ref 0.1–1.0)
Monocytes Relative: 8 %
Neutro Abs: 2.7 10*3/uL (ref 1.7–7.7)
Neutrophils Relative %: 63 %

## 2018-06-22 LAB — PROTIME-INR
INR: 0.99
Prothrombin Time: 13 seconds (ref 11.4–15.2)

## 2018-06-22 LAB — TROPONIN I: Troponin I: <0.03 ng/mL (ref <0.03)

## 2018-06-22 LAB — ETHANOL: Alcohol, Ethyl (B): <10 mg/dL (ref <10)

## 2018-06-22 LAB — APTT: aPTT: 32 seconds (ref 24–36)

## 2018-06-22 MED ORDER — SODIUM CHLORIDE 0.9 % IV SOLN
1.0000 g | Freq: Once | INTRAVENOUS | Status: AC
  Administered 2018-06-22: 1 g via INTRAVENOUS
  Filled 2018-06-22: qty 10

## 2018-06-22 MED ORDER — CEPHALEXIN 500 MG PO CAPS: 500.0000 mg | ORAL_CAPSULE | Freq: Three times a day (TID) | ORAL | 0 refills | Status: DC

## 2018-06-22 NOTE — Discharge Instructions (Addendum)
Please seek medical attention for any high fevers, chest pain, shortness of breath, change in behavior, persistent vomiting, bloody stool or any other new or concerning symptoms.  

## 2018-06-22 NOTE — ED Notes (Signed)
Pt no longer in room at this time.

## 2018-06-22 NOTE — ED Notes (Signed)
ED Provider at bedside. 

## 2018-06-22 NOTE — ED Triage Notes (Signed)
Pt states that for the past couple days she has been having vision changes to both eyes. States that now it is starting to scare her. Pt had someone drive. Pt denies any new weakness, numbness or tingling, reports that she had a stroke jan 3 last year

## 2018-06-22 NOTE — ED Notes (Signed)
Pt transported to MRI at this time 

## 2018-06-22 NOTE — ED Provider Notes (Signed)
[facility] Emergency Department Provider Note  ____________________________________________   I have reviewed the triage vital signs and the nursing notes.   HISTORY  Chief Complaint Visual Field Change   History limited by: Not Limited   HPI [name] is a 83 y.o. female who presents to the emergency department today because of concerns for visual changes.  Patient states this change started a few days ago.  She describes it as seeing objects in the front of her vision.  She has also had a faint blurry vision.  She also describes difficulty with understanding what she is reading.  She denies any actual loss of vision.  She states that she does wear corrective lenses when she reads small print. She states she does have a history of stroke last year. No headache.    Per medical record review patient has a history of HLD, stroke.   Past Medical History:  Diagnosis Date  . Hyperlipidemia   . Stroke [facility])     Patient Active Problem List   Diagnosis Date Noted  . Paroxysmal atrial fibrillation (HCC) 08/23/2017  . Gait disturbance, post-stroke 07/18/2017  . Cerebral hemorrhage (HCC) 07/11/2017  . Cognitive deficit, post-stroke   . Hyperglycemia   . Hypertension   . Neurologic gait disorder   . Cerebrovascular accident (CVA) due to occlusion of left posterior communicating artery (HCC) 06/20/2017  . Stroke (cerebrum) (HCC) 06/15/2017    Past Surgical History:  Procedure Laterality Date  . LOOP RECORDER INSERTION N/A 06/20/2017   Procedure: LOOP RECORDER INSERTION;  Surgeon: [name], MD;  Location: [facility] INVASIVE CV LAB;  Service: Cardiovascular;  Laterality: N/A;  . TEE WITHOUT CARDIOVERSION N/A 06/19/2017   Procedure: TRANSESOPHAGEAL ECHOCARDIOGRAM (TEE) WITH LOOP;  Surgeon: Jake Bathe, MD;  Location: [facility] ENDOSCOPY;  Service: Cardiovascular;  Laterality: N/A;    Prior to Admission medications   Medication Sig Start Date End Date Taking?  Authorizing Provider  aspirin 81 MG tablet Take 81 mg by mouth daily.    [provider]  atorvastatin (LIPITOR) 20 MG tablet Take 1 tablet (20 mg total) by mouth daily at 6 PM. 06/28/17   Angiulli, Mcarthur Rossetti, PA-C    Allergies Patient has no known allergies.  Family History  Problem Relation Age of Onset  . Heart attack Father   . Breast cancer Sister   . Breast cancer Brother     Social History Social History   Tobacco Use  . Smoking status: Never Smoker  . Smokeless tobacco: Never Used  . Tobacco comment: None  Substance Use Topics  . Alcohol use: No    Frequency: Never    Comment: None  . Drug use: No    Comment: None    Review of Systems Constitutional: No fever/chills Eyes: Positive for visual change. ENT: No sore throat. Cardiovascular: Denies chest pain. Respiratory: Denies shortness of breath. Gastrointestinal: No abdominal pain.  No nausea, no vomiting.  No diarrhea.   Genitourinary: Negative for dysuria. Musculoskeletal: Negative for back pain. Skin: Negative for rash. Neurological: Negative for headaches, focal weakness or numbness.  ____________________________________________   PHYSICAL EXAM:  VITAL SIGNS: ED Triage Vitals [06/22/18 1025]  Enc Vitals Group     BP (!) 183/80     Pulse Rate 79     Resp 18     Temp 97.7 F (36.5 C)     Temp Source Oral     SpO2 97 %     Weight 122 lb 9.6  oz (55.6 kg)     Height 5\' 3"  (1.6 m)     Head Circumference      Peak Flow      Pain Score 0   Constitutional: Alert and oriented.  Eyes: Conjunctivae are normal.  ENT      Head: Normocephalic and atraumatic.      Nose: No congestion/rhinnorhea.      Mouth/Throat: Mucous membranes are moist.      Neck: No stridor. Hematological/Lymphatic/Immunilogical: No cervical lymphadenopathy. Cardiovascular: Normal rate, regular rhythm.  No murmurs, rubs, or gallops.  Respiratory: Normal respiratory effort without tachypnea nor retractions. Breath sounds  are clear and equal bilaterally. No wheezes/rales/rhonchi. Gastrointestinal: Soft and non tender. No rebound. No guarding.  Genitourinary: Deferred Musculoskeletal: Normal range of motion in all extremities. No lower extremity edema. Neurologic:  Normal speech and language. No gross focal neurologic deficits are appreciated.  Skin:  Skin is warm, dry and intact. No rash noted. Psychiatric: Mood and affect are normal. Speech and behavior are normal. Patient exhibits appropriate insight and judgment.  ____________________________________________    LABS (pertinent positives/negatives)  Ethanol <10 INR .99 CBC wbc 4.3, hgb 13.9, plt 137 Trop <0.03 CMP wnl except glu 108  ____________________________________________   EKG  I, Phineas Semen, attending physician, personally viewed and interpreted this EKG  EKG Time: 1033 Rate: 76 Rhythm: normal sinus rhythm Axis: normal Intervals: qtc 414 QRS: narrow ST changes: no st elevation Impression: normal ekg   ____________________________________________    RADIOLOGY  CT head Prior infarct medial occipital lobe, nonacute appearing infarct in posterior left thalamus.   MR brain No acute stroke ____________________________________________   PROCEDURES  Procedures  ____________________________________________   INITIAL IMPRESSION / ASSESSMENT AND PLAN / ED COURSE  Pertinent labs & imaging results that were available during my care of the patient were reviewed by me and considered in my medical decision making (see chart for details).   Patient presented to the emergency department today because of concerns for vision changes.  In discussion with the patient is unclear if it is purely vision change or also also some inability to understand the words.  CT head performed at triage not show any acute findings.  Given history of stroke MRI was performed which not show any acute stroke.  Patient did have signs of urinary tract  infection.  I wonder if this could be playing a role in the patient's symptoms.  Patient was given dose of IV antibiotics here.  Will discharge with further antibiotics.    ____________________________________________   FINAL CLINICAL IMPRESSION(S) / ED DIAGNOSES  Final diagnoses:  Vision changes  Lower urinary tract infection     Note: This dictation was prepared with Dragon dictation. Any transcriptional errors that result from this process are unintentional     Phineas Semen, MD 06/22/18 2307

## 2018-06-22 NOTE — ED Notes (Signed)
Pt speaking to [facility] with MRI for screening questions.

## 2019-03-15 ENCOUNTER — Encounter: Payer: Self-pay | Admitting: Intensive Care

## 2019-03-15 ENCOUNTER — Other Ambulatory Visit: Payer: Self-pay

## 2019-03-15 ENCOUNTER — Emergency Department: Payer: Medicare Other

## 2019-03-15 ENCOUNTER — Emergency Department
Admission: EM | Admit: 2019-03-15 | Discharge: 2019-03-15 | Disposition: A | Discharge status: Home / Self Care | Payer: Medicare Other | Attending: Emergency Medicine | Admitting: Emergency Medicine

## 2019-03-15 DIAGNOSIS — R262 Difficulty in walking, not elsewhere classified: Secondary | ICD-10-CM | POA: Insufficient documentation

## 2019-03-15 DIAGNOSIS — N390 Urinary tract infection, site not specified: Secondary | ICD-10-CM | POA: Insufficient documentation

## 2019-03-15 DIAGNOSIS — Z79899 Other long term (current) drug therapy: Secondary | ICD-10-CM | POA: Diagnosis not present

## 2019-03-15 DIAGNOSIS — H538 Other visual disturbances: Secondary | ICD-10-CM | POA: Diagnosis present

## 2019-03-15 DIAGNOSIS — I10 Essential (primary) hypertension: Secondary | ICD-10-CM | POA: Diagnosis not present

## 2019-03-15 DIAGNOSIS — Z20828 Contact with and (suspected) exposure to other viral communicable diseases: Secondary | ICD-10-CM | POA: Insufficient documentation

## 2019-03-15 DIAGNOSIS — Z8673 Personal history of transient ischemic attack (TIA), and cerebral infarction without residual deficits: Secondary | ICD-10-CM | POA: Insufficient documentation

## 2019-03-15 LAB — COMPREHENSIVE METABOLIC PANEL
ALT: 17 U/L (ref 0–44)
AST: 23 U/L (ref 15–41)
Albumin: 4.5 g/dL (ref 3.5–5.0)
Alkaline Phosphatase: 62 U/L (ref 38–126)
Anion gap: 9 (ref 5–15)
BUN: 27 mg/dL — ABNORMAL HIGH (ref 8–23)
CO2: 26 mmol/L (ref 22–32)
Calcium: 9.6 mg/dL (ref 8.9–10.3)
Chloride: 105 mmol/L (ref 98–111)
Creatinine, Ser: 0.85 mg/dL (ref 0.44–1.00)
GFR calc Af Amer: >60 mL/min (ref >60)
GFR calc non Af Amer: >60 mL/min (ref >60)
Glucose, Bld: 99 mg/dL (ref 70–99)
Potassium: 3.7 mmol/L (ref 3.5–5.1)
Sodium: 140 mmol/L (ref 135–145)
Total Bilirubin: 1.2 mg/dL (ref 0.3–1.2)
Total Protein: 6.8 g/dL (ref 6.5–8.1)

## 2019-03-15 LAB — CBC WITH DIFFERENTIAL/PLATELET
Abs Immature Granulocytes: 0.01 10*3/uL (ref 0.00–0.07)
Basophils Absolute: 0 10*3/uL (ref 0.0–0.1)
Basophils Relative: 1 %
Eosinophils Absolute: 0.1 10*3/uL (ref 0.0–0.5)
Eosinophils Relative: 1 %
HCT: 37.8 % (ref 36.0–46.0)
Hemoglobin: 12.9 g/dL (ref 12.0–15.0)
Immature Granulocytes: 0 %
Lymphocytes Relative: 28 %
Lymphs Abs: 1.7 10*3/uL (ref 0.7–4.0)
MCH: 32.5 pg (ref 26.0–34.0)
MCHC: 34.1 g/dL (ref 30.0–36.0)
MCV: 95.2 fL (ref 80.0–100.0)
Monocytes Absolute: 0.5 10*3/uL (ref 0.1–1.0)
Monocytes Relative: 8 %
Neutro Abs: 3.8 10*3/uL (ref 1.7–7.7)
Neutrophils Relative %: 62 %
Platelets: 139 10*3/uL — ABNORMAL LOW (ref 150–400)
RBC: 3.97 MIL/uL (ref 3.87–5.11)
RDW: 12.1 % (ref 11.5–15.5)
WBC: 6 10*3/uL (ref 4.0–10.5)
nRBC: 0 % (ref 0.0–0.2)

## 2019-03-15 LAB — URINALYSIS, COMPLETE (UACMP) WITH MICROSCOPIC
Bilirubin Urine: NEGATIVE
Glucose, UA: NEGATIVE mg/dL
Hgb urine dipstick: NEGATIVE
Ketones, ur: 5 mg/dL — AB
Nitrite: POSITIVE — AB
Protein, ur: NEGATIVE mg/dL
Specific Gravity, Urine: 1.011 (ref 1.005–1.030)
pH: 5 (ref 5.0–8.0)

## 2019-03-15 LAB — SARS CORONAVIRUS 2 (TAT 6-24 HRS): SARS Coronavirus 2: NEGATIVE

## 2019-03-15 LAB — PROTIME-INR
INR: 1 (ref 0.8–1.2)
Prothrombin Time: 13.5 seconds (ref 11.4–15.2)

## 2019-03-15 LAB — CKMB (ARMC ONLY): CK, MB: 8.4 ng/mL — ABNORMAL HIGH (ref 0.5–5.0)

## 2019-03-15 MED ORDER — SODIUM CHLORIDE 0.9 % IV SOLN
1.0000 g | Freq: Once | INTRAVENOUS | Status: AC
  Administered 2019-03-15: 1 g via INTRAVENOUS
  Filled 2019-03-15: qty 10

## 2019-03-15 MED ORDER — CEPHALEXIN 250 MG PO CAPS: 250.0000 mg | ORAL_CAPSULE | Freq: Two times a day (BID) | ORAL | 0 refills | Status: AC

## 2019-03-15 NOTE — ED Notes (Signed)
Patient transported to CT 

## 2019-03-15 NOTE — Discharge Instructions (Signed)

## 2019-03-15 NOTE — ED Provider Notes (Signed)
[facility] Emergency Department Provider Note   ____________________________________________   First MD Initiated Contact with Patient 03/15/19 [id number]     (approximate)  I have reviewed the triage vital signs and the nursing notes.   HISTORY  Chief Complaint Numbness and Blurred Vision    HPI [name] is a 83 y.o. female history of previous stroke and hyperlipidemia  Patient presents today reports for 2 days now she has been having trouble walking.  Her balance feels off.  She still out doors being active but just feels like her balance is off and her vision has felt blurry.  No weakness in the face no trouble speaking.  Her muscle still feels strong.  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  Symptoms started 2 days ago  No chest pain no nausea vomiting.  No exposure to COVID.   Past Medical History:  Diagnosis Date  . Hyperlipidemia   . Stroke [facility])     Patient Active Problem List   Diagnosis Date Noted  . Paroxysmal atrial fibrillation ([place]) 08/23/2017  . Gait disturbance, post-stroke 07/18/2017  . Cerebral hemorrhage ([place]) 07/11/2017  . Cognitive deficit, post-stroke   . Hyperglycemia   . Hypertension   . Neurologic gait disorder   . Cerebrovascular accident (CVA) due to occlusion of left posterior communicating artery ([place]) 06/20/2017  . Stroke (cerebrum) ([place]) 06/15/2017    Past Surgical History:  Procedure Laterality Date  . LOOP RECORDER INSERTION N/A 06/20/2017   Procedure: LOOP RECORDER INSERTION;  Surgeon: Thompson Grayer, MD;  Location: [place] CV LAB;  Service: Cardiovascular;  Laterality: N/A;  . TEE WITHOUT CARDIOVERSION N/A 06/19/2017   Procedure: TRANSESOPHAGEAL ECHOCARDIOGRAM (TEE) WITH LOOP;  Surgeon: Jerline Pain, MD;  Location: [facility] ENDOSCOPY;  Service: Cardiovascular;  Laterality: N/A;     Prior to Admission medications   Medication Sig Start Date End Date Taking? Authorizing Provider  atorvastatin (LIPITOR) 20 MG tablet Take 1 tablet (20 mg total) by mouth daily at 6 PM. Patient not taking: Reported on 06/22/2018 06/28/17   Angiulli, Lavon Paganini, PA-C  cephALEXin (KEFLEX) 250 MG capsule Take 1 capsule (250 mg total) by mouth 2 (two) times daily for 7 days. 03/15/19 03/22/19  Delman Kitten, MD    Allergies Patient has no known allergies.  Family History  Problem Relation Age of Onset  . Heart attack Father   . Breast cancer Sister   . Breast cancer Brother     Social History Social History   Tobacco Use  . Smoking status: Never Smoker  . Smokeless tobacco: Never Used  . Tobacco comment: None  Substance Use Topics  . Alcohol use: No    Frequency: Never    Comment: None  . Drug use: No    Comment: None    Review of Systems Constitutional: No fever/chills or recent illness Eyes: Has not lost vision but feels like a blurring of her visual field last 2 days hard to describe ENT: No sore throat. Cardiovascular: Denies chest pain. Respiratory: Denies shortness of breath. Gastrointestinal: No abdominal pain.   Genitourinary: Negative for dysuria. Musculoskeletal: Negative for back pain. Skin: Negative for rash. Neurological: Negative for headaches, areas of focal weakness or numbness.  She does report difficulty walking, her coordination and balance seem poor the last 2 days.    ____________________________________________  PHYSICAL EXAM:  VITAL SIGNS: ED Triage Vitals  Enc Vitals Group     BP 03/15/19 0747 (!) 149/87     Pulse Rate 03/15/19 0750 77     Resp 03/15/19 0750 16     Temp 03/15/19 0750 97.8 F (36.6 C)     Temp Source 03/15/19 0750 Oral     SpO2 03/15/19 0750 100 %     Weight 03/15/19 0748 130 lb (59 kg)     Height 03/15/19 0748 5\' 4"  (1.626 m)     Head Circumference --      Peak Flow --      Pain Score 03/15/19 0748 0     Pain Loc --       Pain Edu? --      Excl. in GC? --     Constitutional: Alert and oriented. Well appearing and in no acute distress. Eyes: Conjunctivae are normal. Head: Atraumatic. Nose: No congestion/rhinnorhea. Mouth/Throat: Mucous membranes are moist. Neck: No stridor.  Cardiovascular: Normal rate, regular rhythm. Grossly normal heart sounds.  Good peripheral circulation. Respiratory: Normal respiratory effort.  No retractions. Lungs CTAB. Gastrointestinal: Soft and nontender. No distention. Musculoskeletal: No lower extremity tenderness nor edema.  She has excellent strength in all extremities.  I notice no deficits.  No ataxia is noted either. Neurologic:  Normal speech and language. No gross focal neurologic deficits are appreciated.  She reports her visual fields intact.  Just reports a sense of blurring in the vision in both eyes slight. Skin:  Skin is warm, dry and intact. No rash noted. Psychiatric: Mood and affect are normal. Speech and behavior are normal.  ____________________________________________   LABS (all labs ordered are listed, but only abnormal results are displayed)  Labs Reviewed  COMPREHENSIVE METABOLIC PANEL - Abnormal; Notable for the following components:      Result Value   BUN 27 (*)    All other components within normal limits  CBC WITH DIFFERENTIAL/PLATELET - Abnormal; Notable for the following components:   Platelets 139 (*)    All other components within normal limits  CKMB([facility] ONLY) - Abnormal; Notable for the following components:   CK, MB 8.4 (*)    All other components within normal limits  URINALYSIS, COMPLETE (UACMP) WITH MICROSCOPIC - Abnormal; Notable for the following components:   Color, Urine YELLOW (*)    APPearance HAZY (*)    Ketones, ur 5 (*)    Nitrite POSITIVE (*)    Leukocytes,Ua LARGE (*)    Bacteria, UA FEW (*)    All other components within normal limits  SARS CORONAVIRUS 2 (TAT 6-24 HRS)  URINE CULTURE  PROTIME-INR    ____________________________________________  EKG  Reviewed entered by me at 8 AM Heart rate 79 QRS 85 QTc 430 Normal sinus rhythm, no evidence ischemia.  Single PVC ____________________________________________  RADIOLOGY  Ct Head Wo Contrast  Result Date: 03/15/2019 CLINICAL DATA:  Lower extremity numbness with slurred speech and blurred vision. Difficulty ambulating. Confusion. EXAM: CT HEAD WITHOUT CONTRAST TECHNIQUE: Contiguous axial images were obtained from the base of the skull through the vertex without intravenous contrast. COMPARISON:  June 22, 2018 FINDINGS: Brain: Stable age related volume loss. No mass, hemorrhage, extra-axial fluid collection or midline shift evident. Prior infarct in the medial left occipital lobe is again noted without change. There is evidence of a prior infarct in the posterior left thalamus. There is slight small vessel disease in the centra semiovale bilaterally. No acute infarct is demonstrable on this  study. Vascular: No hyperdense vessel. There is slight calcification in the left carotid siphon region. Skull: The bony calvarium appears intact. Sinuses/Orbits: There is mild mucosal thickening in several ethmoid air cells. Other visualized paranasal sinuses are clear. Visualized orbits appear symmetric bilaterally. Other: Visualized mastoid air cells are clear. IMPRESSION: Prior infarcts in the medial left occipital lobe and left thalamus posteriorly. There is slight periventricular small vessel disease. No acute appearing infarct is demonstrable on this study. There is no mass or hemorrhage evident. Slight arterial vascular calcification. Mild mucosal thickening in several ethmoid air cells. Electronically Signed   By: Bretta BangWilliam  Woodruff III M.D.   On: 03/15/2019 09:03    Imaging studies reviewed ____________________________________________   PROCEDURES  Procedure(s) performed: None  Procedures  Critical Care performed: No  ____________________________________________   INITIAL IMPRESSION / ASSESSMENT AND PLAN / ED COURSE  Pertinent labs & imaging results that were available during my care of the patient were reviewed by me and considered in my medical decision making (see chart for details).   Patient notes for 48 hours of difficulty with balance and also feeling of blurring of vision.  Patient family reports he is extremely active at baseline, but her symptoms have been noticeable and persistent for 2 days.  No COVID risk factors.  Denies any infectious symptoms.  No dysuria.  Differential diagnosis is broad, but given her previous history neurologic etiology such as stroke remains high in differential.  Will initiate work-up with head CT, lab work, and have placed consultation to neurology for their input as well per Dr. Loretha BrasilZeylikman.  Clinical Course as of Mar 14 1150  Fri Mar 15, 2019  [id number] Consult to Dr. Loretha BrasilZeylikman placed   [MQ]  1031 Patient resting comfortably.  Reassuring work-up except does appear likely urinary tract infection.  Dr. Loretha BrasilZeylikman talked with me and saw the patient, he reports he does not find any signs to indicate further work-up and is also not see any signs of stroke but would recommend also treatment for possible UTI.  We are in agreement with the plan.  Patient also agreeable with plan, starting antibiotic here   [MQ]    Clinical Course User Index [MQ] Sharyn CreamerQuale, Mark, MD   ----------------------------------------- 11:51 AM on 03/15/2019 -----------------------------------------  Return precautions and treatment recommendations and follow-up discussed with the patient who is agreeable with the plan.   ____________________________________________   FINAL CLINICAL IMPRESSION(S) / ED DIAGNOSES  Final diagnoses:  Urinary tract infection, acute        Note:  This document was prepared using Dragon voice recognition software and may include unintentional dictation errors        Sharyn CreamerQuale, Mark, MD 03/15/19 1151

## 2019-03-15 NOTE — Progress Notes (Signed)
 [facility] - Documentation Encounter  Preventive Care-  Gap Closure 03/15/2019  Annual Wellness Visits No Action Taken  Comments: Chart reviewed.  Pt currently admitted.  [facility] ED - stroke-like symptoms per [facility].  HTN should be addressed during this admission.    Suzen Fenton  For more information on [employer], click here.

## 2019-03-15 NOTE — ED Triage Notes (Signed)
Patient arrived by POV with daughter. Around two days ago daughter reports patient started having bilateral leg numbness, slurred speech, blurry vision, impaired hearing, and unsteady on feet with confusion. Patient had stroke last year that left her with cognitive issues.

## 2019-03-15 NOTE — ED Notes (Signed)
First Nurse Note: Pt brought to ED by sister who states that she thinks pt is having or has had a stroke. Pt was ambulatory into ED without difficulty. No slurred speech or facial droop noted. Pt is in NAD.

## 2019-03-17 LAB — URINE CULTURE
Culture: >=100000 — AB
Special Requests: NORMAL

## 2019-08-19 ENCOUNTER — Other Ambulatory Visit: Payer: Self-pay | Admitting: Physician Assistant

## 2019-08-19 DIAGNOSIS — H9071 Mixed conductive and sensorineural hearing loss, unilateral, right ear, with unrestricted hearing on the contralateral side: Secondary | ICD-10-CM

## 2019-08-28 ENCOUNTER — Ambulatory Visit
Admission: RE | Admit: 2019-08-28 | Discharge: 2019-08-28 | Disposition: A | Discharge status: Home / Self Care | Payer: Medicare Other | Source: Ambulatory Visit | Attending: Physician Assistant | Admitting: Physician Assistant

## 2019-08-28 ENCOUNTER — Other Ambulatory Visit: Payer: Self-pay

## 2019-08-28 DIAGNOSIS — H9071 Mixed conductive and sensorineural hearing loss, unilateral, right ear, with unrestricted hearing on the contralateral side: Secondary | ICD-10-CM | POA: Diagnosis present

## 2019-12-09 NOTE — Progress Notes (Signed)
  Subjective:    Chief Complaint  Patient presents with  . Dizziness    x 3 days  . Other    Unbalanced x 3 days  . Urinary Frequency    x 3 days  . Altered Mental Status    x 3 days    History was provided by the patient and caregiver. [name] is a 84 y.o., female  who presents with concerns for possible urinary tract infection.  Patient with history of confusion surrounding previous urinary tract infections.  Also with history of CVA, hypertension, hyperlipidemia.  Patient last seen in ED on October 2020, CT scan revealed no acute changes, found to have urinary tract infection, treated with cephalosporin, with resolution.  Symptoms sound quite similar to today's presentation.  Patient here caregiver, who understands the limitations of our services, and cannot exclude other causes of her confusion at this time. Caregiver voices understanding appreciation.  Reports daughter usually brings her in, daughter is out of town at this time.  Reports patient is typically active, working out garden all day.  Patient also hard of hearing, visual deficit. Symptoms include: above   Patient denies: above Treatment to date: above   Past Medical History:  Diagnosis Date  . Hyperlipidemia    The following portions of the patient's history were reviewed and updated as appropriate: allergies, current medications, past social history, and problem list.  Review of Systems A complete review of systems was performed.  Positive and pertinent negative responses are documented in the HPI, and all other systems are negative.   Objective:     Vitals:   12/09/19 1622  BP: (!) 167/97  Pulse: 80  Temp: 36.9 C (98.5 F)  TempSrc: Oral  SpO2: 96%  Weight: 52.6 kg (116 lb)  Height: 160 cm (5' 3)    General Appearance:  Well-developed, well-nourished.  Pleasant and cooperative.  Appears well tanned.  Hard of hearing.  Confused at times. Neck:  Supple, no JVD or adenopathy. Cardiovascular:   Regular rate and rhythm. Lungs:  Clear to auscultation.  Abdomen:  Soft, nontender, nondistended. Normoactive bowel sounds.  Back: No CVA tenderness. Extremities:   No cyanosis, clubbing, or edema. Neuro: Awake and alert, pleasant, cooperative, confused at times.    Lab/X-ray/Treatments:   UA-positive for nitrites, moderate leukocyte esterase, moderate bacteria Urine culture-pending       Assessment:      1. Bacterial UTI - cefdinir (OMNICEF) 300 mg capsule; Take 1 capsule (300 mg total) by mouth 2 (two) times daily for 10 days  Dispense: 20 capsule; Refill: 0  2. Confusion, unspecified  3. UTI symptoms - Urinalysis w/Microscopic - Urine Culture, Routine - [facility]  Omnicef twice daily for 1 week.  Fluids.  Pyridium..  Again reinforced inability to exclude other possible causes of altered mentation.  Caregiver to communicate with patient's daughter, and understands should be brought directly to ED if further concerns.    Plan:   Requested Prescriptions   Signed Prescriptions Disp Refills  . cefdinir (OMNICEF) 300 mg capsule 20 capsule 0    Sig: Take 1 capsule (300 mg total) by mouth 2 (two) times daily for 10 days

## 2020-10-06 IMAGING — CT CT HEAD W/O CM
3 of 4 series · 15 of 47 positions shown, 18 images · non-contrast
Comparison: July 12, 2017

CLINICAL DATA: Altered vision.

EXAM:
CT HEAD WITHOUT CONTRAST
TECHNIQUE: Contiguous axial images were obtained from the base of the skull
through the vertex without intravenous contrast.

[Series 3: head wo · axial · 0.41mm/px · z∈[-80,+50]mm · 9 of 30 slices shown, 12 images]
[im 2/30  brain]
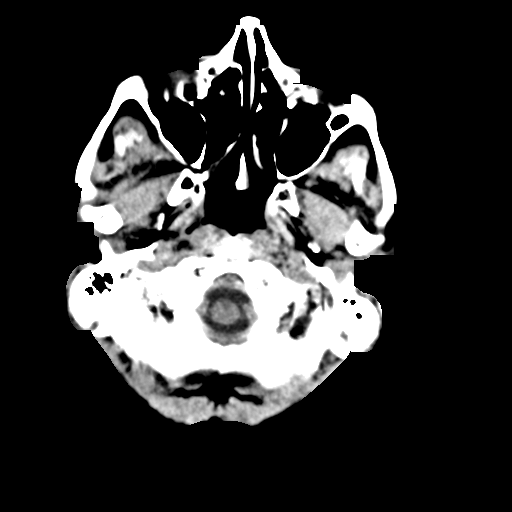
[im 2/30  bone]
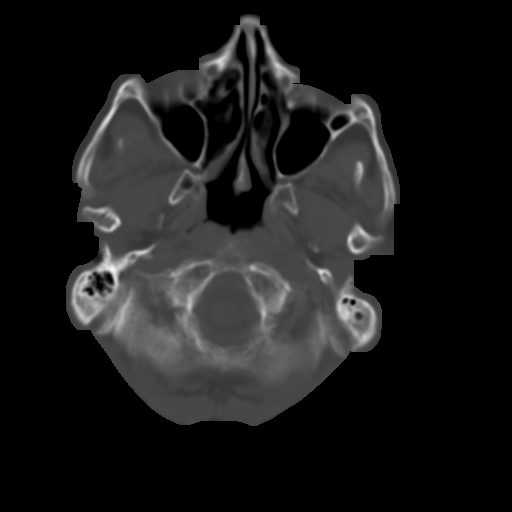
[im 6/30  brain]
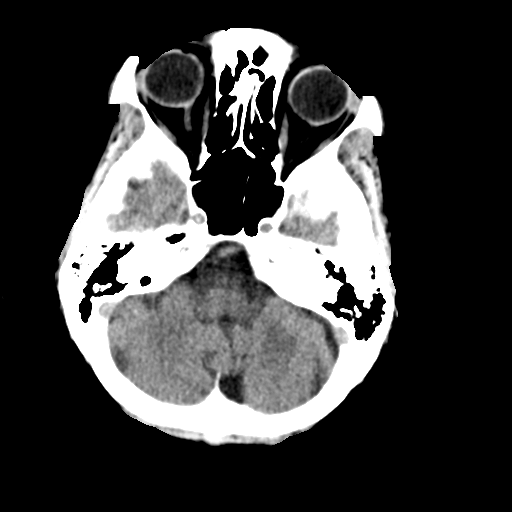
[im 8/30  brain]
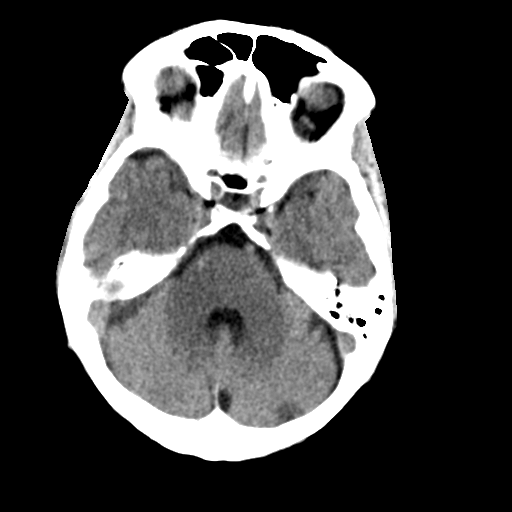
[im 12/30  brain]
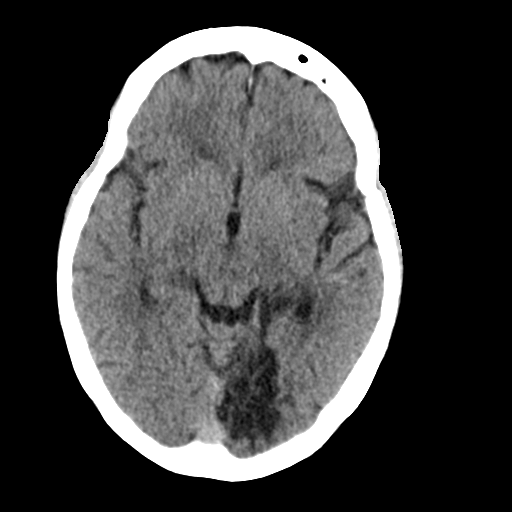
[im 16/30  brain]
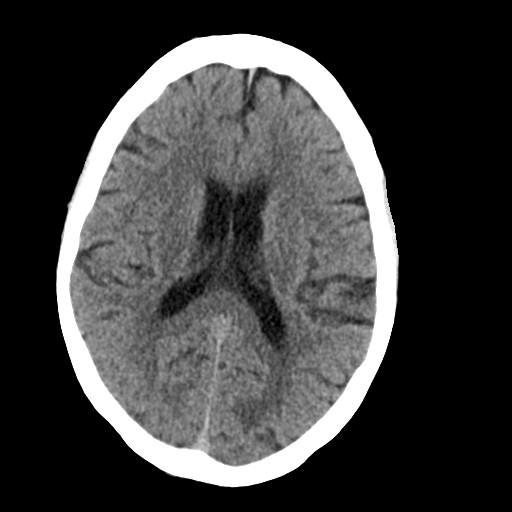
[im 16/30  bone]
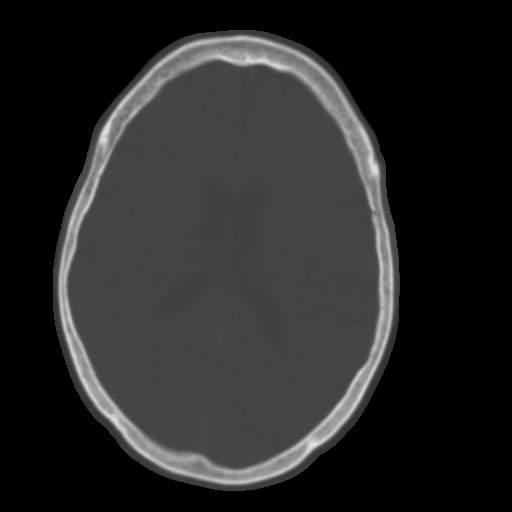
[im 18/30  brain]
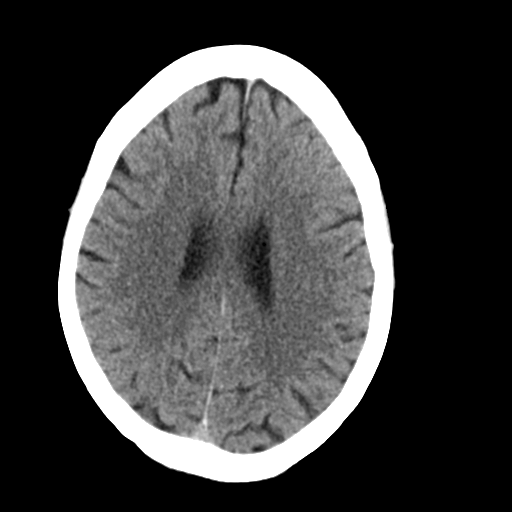
[im 22/30  brain]
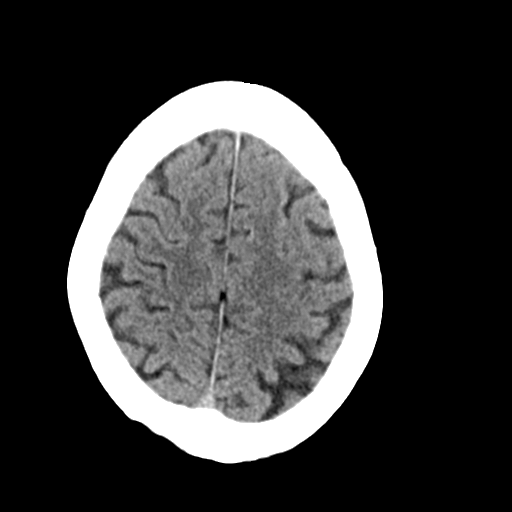
[im 24/30  brain]
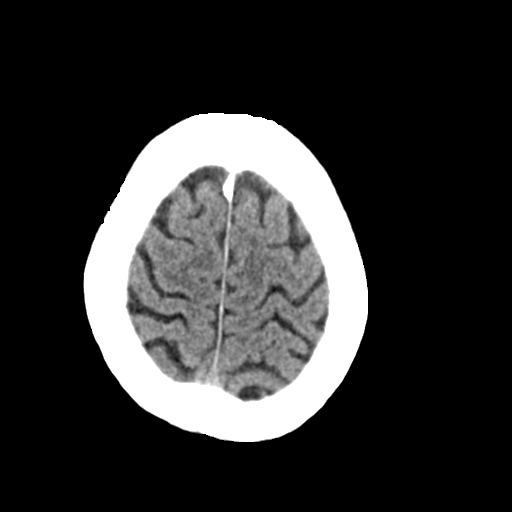
[im 28/30  brain]
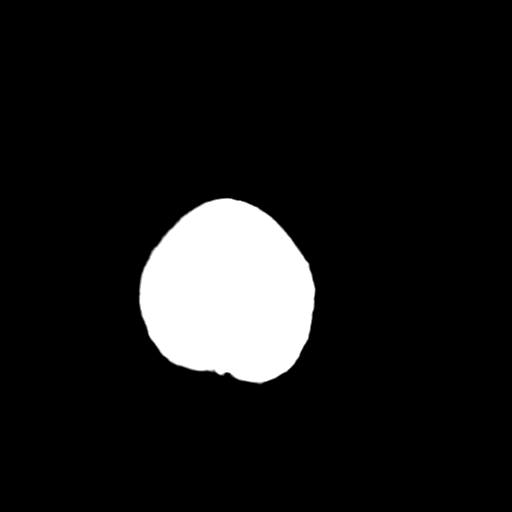
[im 28/30  bone]
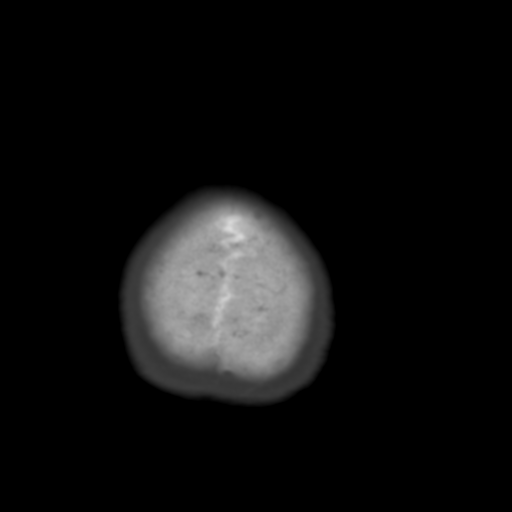

[Series 5: coronal soft tissue · coronal · 0.31mm/px · 3 of 63 slices shown]
[im 21/63  brain]
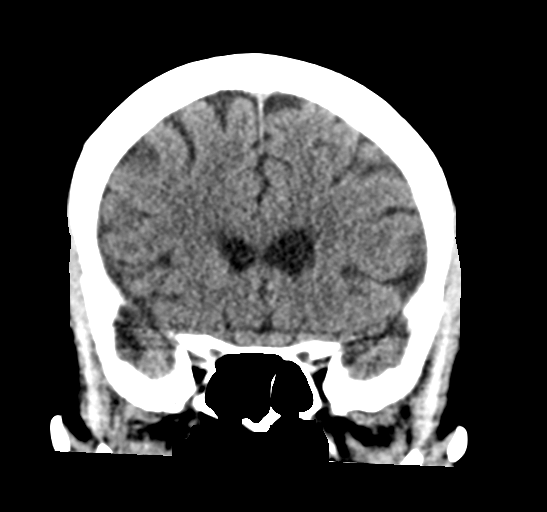
[im 28/63  brain]
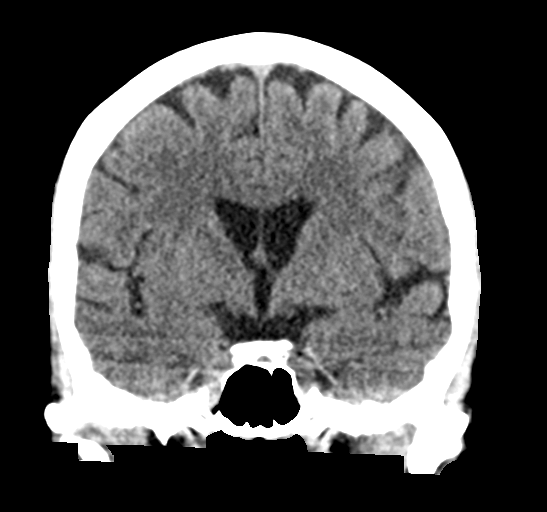
[im 35/63  brain]
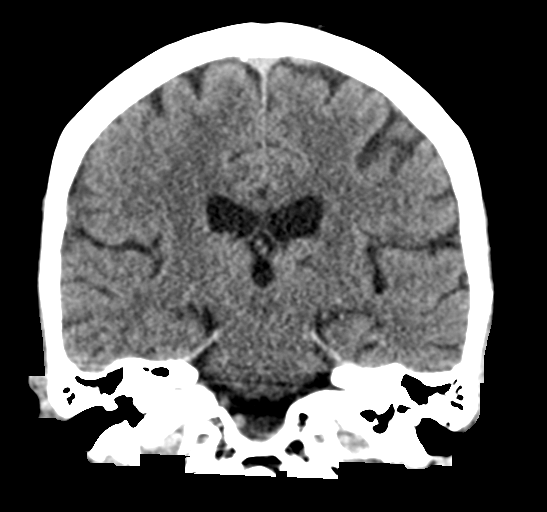

[Series 6: sagittal soft tissue · sagittal · 0.31mm/px · 3 of 48 slices shown]
[im 16/48  brain]
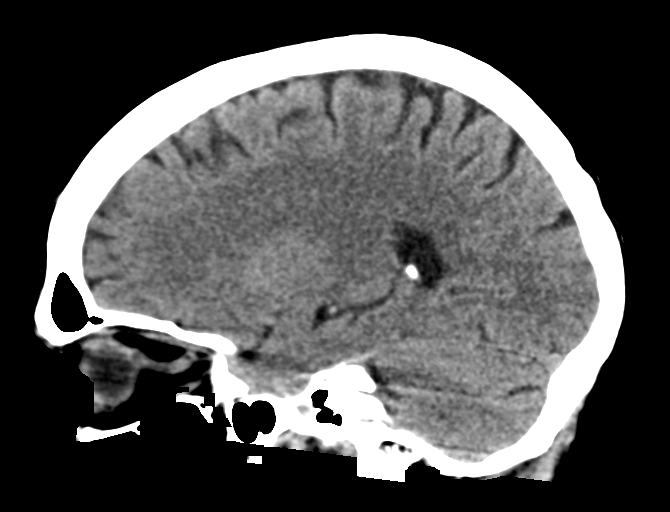
[im 24/48  brain]
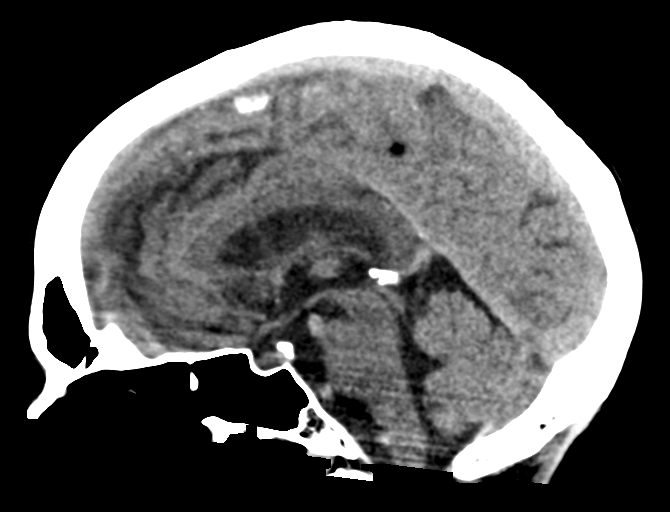
[im 32/48  brain]
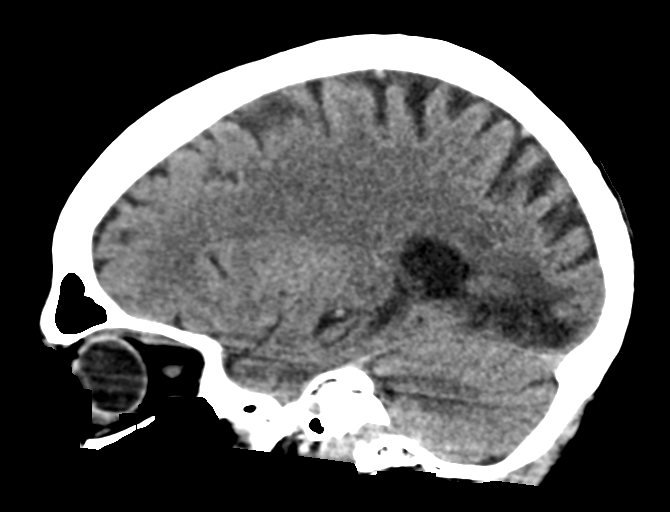

[15 of 47 positions shown; findings below may reference images not displayed]

FINDINGS: Brain: There is age related volume loss, stable. There is no
demonstrable intracranial mass, hemorrhage, extra-axial fluid
collection, or midline shift. Prior infarct in the medial occipital
lobe is again noted. The previous hemorrhage in this area has
resolved. There is evidence of a prior infarct in the left posterior
thalamus, not convincingly appreciable on the previous study. No
acute appearing infarct is evident.

Vascular: No hyperdense vessel. There is slight calcification in the
left carotid siphon.

Skull: The bony calvarium appears intact.

Sinuses/Orbits: There is mucosal thickening involving several
ethmoid air cells. Other visualized paranasal sinuses are clear.
Orbits appear symmetric bilaterally.

Other: Mastoid air cells are clear.
IMPRESSION: 1. Prior infarct medial left occipital lobe. Prior hemorrhage in
this area no longer appreciable.

2. Nonacute appearing infarct in the posterior left thalamus, not
appreciable on prior CT.

3.  No acute appearing infarct.  No mass or hemorrhage.

4.  Slight arterial vascular calcification.

5.  Focal thickening in several ethmoid air cells.

## 2024-03-29 ENCOUNTER — Encounter: Payer: Self-pay | Admitting: Student

## 2024-03-29 ENCOUNTER — Other Ambulatory Visit: Payer: Self-pay | Admitting: Student

## 2024-03-29 ENCOUNTER — Ambulatory Visit: Admitting: Student

## 2024-03-29 ENCOUNTER — Telehealth: Payer: Self-pay | Admitting: Student

## 2024-03-29 VITALS — BP 160/98 | HR 59 | Ht 62.0 in | Wt 116.0 lb

## 2024-03-29 DIAGNOSIS — Z8673 Personal history of transient ischemic attack (TIA), and cerebral infarction without residual deficits: Secondary | ICD-10-CM | POA: Insufficient documentation

## 2024-03-29 DIAGNOSIS — R600 Localized edema: Secondary | ICD-10-CM | POA: Diagnosis not present

## 2024-03-29 DIAGNOSIS — I480 Paroxysmal atrial fibrillation: Secondary | ICD-10-CM

## 2024-03-29 DIAGNOSIS — I10 Essential (primary) hypertension: Secondary | ICD-10-CM | POA: Diagnosis not present

## 2024-03-29 MED ORDER — APIXABAN 2.5 MG PO TABS: 2.5000 mg | ORAL_TABLET | Freq: Two times a day (BID) | ORAL | 1 refills | Status: DC

## 2024-03-29 MED ORDER — OLMESARTAN MEDOXOMIL 20 MG PO TABS: 20.0000 mg | ORAL_TABLET | Freq: Every day | ORAL | 1 refills | Status: DC

## 2024-03-29 NOTE — Telephone Encounter (Signed)
 Copied from CRM #[id number]. Topic: Clinical - Prescription Issue >> Mar 29, 2024  4:26 PM Zebedee SAUNDERS wrote: Reason for CRM: Pt's niece calling on behalf of pt regarding apixaban  (ELIQUIS ) 2.5 MG TABS tablet which is $300.00 with insurance. Pt would like alternate medication that is coved by insurance. Please call Gini Na at [phone] pt is with niece.

## 2024-03-29 NOTE — Telephone Encounter (Signed)
 Please review

## 2024-03-29 NOTE — Progress Notes (Unsigned)
 Established Patient Office Visit  Subjective   Patient ID: [name], female    DOB: 12-28-1935  Age: 88 y.o. MRN: [id number]  Chief Complaint  Patient presents with   Establish Care    Patient is here to establish care, had a stroke January 2019, nurse came     [name] is a 88 y.o. 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 She has not had routine medical care since around 2020 and is not currently taking any medication. Yesterday had home visit from nurse through insurance and noted to have elevated BP, atrial fibrillation, and LE edema and was advised to go to the ED, however she did not want to go to the ED. Patient presents with sister present. Sister reports she has been urging patient to establish with primary care for several years however patient has been reluctant. Please refer to problem based charting for further details and assessment and plan of current problem and chronic medical conditions.   Patient Active Problem List   Diagnosis Date Noted   Leg edema, right 04/04/2024   History of CVA (cerebrovascular accident) 03/29/2024   Paroxysmal atrial fibrillation (HCC) 08/23/2017   Gait disturbance, post-stroke 07/18/2017   Cerebral hemorrhage (HCC) 07/11/2017   Cognitive deficit, post-stroke    Hyperglycemia    Hypertension    Cerebrovascular accident (CVA) due to occlusion of left posterior communicating artery (HCC) 06/20/2017      ROS Refer to HPI    Objective:     Outpatient Encounter Medications as of 03/29/2024  Medication Sig   olmesartan (BENICAR) 20 MG tablet Take 1 tablet (20 mg total) by mouth daily.   [DISCONTINUED] apixaban  (ELIQUIS ) 2.5 MG TABS tablet Take 1 tablet (2.5 mg total) by mouth 2 (two) times daily.   No facility-administered encounter medications on file as of 03/29/2024.    BP (!)  160/98   Pulse (!) 59   Ht 5' 2 (1.575 m)   Wt 116 lb (52.6 kg)   LMP  (LMP Unknown)   SpO2 97%   BMI 21.22 kg/m  BP Readings from Last 3 Encounters:  03/29/24 (!) 160/98  03/15/19 (!) 177/88  06/22/18 (!) 186/98    Physical Exam Constitutional:      Comments: Chronically ill appearing  HENT:     Mouth/Throat:     Mouth: Mucous membranes are moist.     Pharynx: Oropharynx is clear.  Eyes:     Extraocular Movements: Extraocular movements intact.     Pupils: Pupils are equal, round, and reactive to light.  Cardiovascular:     Rate and Rhythm: Normal rate. Rhythm irregular.     Heart sounds: No murmur heard.    Comments: Bilateral PT pulses in take, decrease DP on the left compare to right Pulmonary:     Effort: Pulmonary effort is normal.     Breath sounds: No rhonchi or rales.  Abdominal:     General: Abdomen is flat. Bowel sounds are normal. There is no distension.     Palpations: Abdomen is soft.     Tenderness: There is no abdominal tenderness.  Musculoskeletal:        General: Normal range of motion.     Right lower leg: Edema (2+ up to knee) present.     Left lower leg: Edema (1+) present.  Skin:    Capillary Refill: Capillary refill  takes less than 2 seconds.     Comments: Dry  healing eschar of the lateral RLE new the knee, tortuous varicosities   Neurological:     General: No focal deficit present.     Mental Status: She is alert and oriented to person, place, and time.     Gait: Gait abnormal.        03/29/2024    1:56 PM 07/18/2017    1:23 PM  Depression screen PHQ 2/9  Decreased Interest 0 0  Down, Depressed, Hopeless 0 0  PHQ - 2 Score 0 0  Altered sleeping 0   Tired, decreased energy 0   Change in appetite 0   Feeling bad or failure about yourself  0   Trouble concentrating 1   Moving slowly or fidgety/restless 2   Suicidal thoughts 0   PHQ-9 Score 3   Difficult doing work/chores Not difficult at all        03/29/2024    1:56 PM  GAD 7  : Generalized Anxiety Score  Nervous, Anxious, on Edge 1  Control/stop worrying 1  Worry too much - different things 0  Trouble relaxing 0  Restless 0  Easily annoyed or irritable 1  Afraid - awful might happen 1  Total GAD 7 Score 4  Anxiety Difficulty Not difficult at all    Results for orders placed or performed in visit on 03/29/24  CBC with Differential/Platelet  Result Value Ref Range   WBC 5.1 3.4 - 10.8 x10E3/uL   RBC 4.02 3.77 - 5.28 x10E6/uL   Hemoglobin 13.3 11.1 - 15.9 g/dL   Hematocrit 59.5 65.9 - 46.6 %   MCV 101 (H) 79 - 97 fL   MCH 33.1 (H) 26.6 - 33.0 pg   MCHC 32.9 31.5 - 35.7 g/dL   RDW 87.4 88.2 - 84.5 %   Platelets 134 (L) 150 - 450 x10E3/uL   Neutrophils 66 Not Estab. %   Lymphs 25 Not Estab. %   Monocytes 8 Not Estab. %   Eos 1 Not Estab. %   Basos 0 Not Estab. %   Neutrophils Absolute 3.3 1.4 - 7.0 x10E3/uL   Lymphocytes Absolute 1.3 0.7 - 3.1 x10E3/uL   Monocytes Absolute 0.4 0.1 - 0.9 x10E3/uL   EOS (ABSOLUTE) 0.1 0.0 - 0.4 x10E3/uL   Basophils Absolute 0.0 0.0 - 0.2 x10E3/uL   Immature Granulocytes 0 Not Estab. %   Immature Grans (Abs) 0.0 0.0 - 0.1 x10E3/uL  Comprehensive metabolic panel with GFR  Result Value Ref Range   Glucose 93 70 - 99 mg/dL   BUN 14 8 - 27 mg/dL   Creatinine, Ser 9.20 0.57 - 1.00 mg/dL   eGFR 73 >40 fO/fpw/8.26   BUN/Creatinine Ratio 18 12 - 28   Sodium 142 134 - 144 mmol/L   Potassium 5.6 (H) 3.5 - 5.2 mmol/L   Chloride 105 96 - 106 mmol/L   CO2 23 20 - 29 mmol/L   Calcium  9.7 8.7 - 10.3 mg/dL   Total Protein 6.7 6.0 - 8.5 g/dL   Albumin 4.5 3.7 - 4.7 g/dL   Globulin, Total 2.2 1.5 - 4.5 g/dL   Bilirubin Total 0.6 0.0 - 1.2 mg/dL   Alkaline Phosphatase 87 48 - 129 IU/L   AST 48 (H) 0 - 40 IU/L   ALT 41 (H) 0 - 32 IU/L  Lipid Panel With LDL/HDL Ratio  Result Value Ref Range   Cholesterol, Total 188 100 - 199 mg/dL   Triglycerides  48 0 - 149 mg/dL   HDL 87 >60 mg/dL   VLDL Cholesterol Cal 9 5 - 40 mg/dL    LDL Chol Calc (NIH) 92 0 - 99 mg/dL   LDL/HDL Ratio 1.1 0.0 - 3.2 ratio  Hgb A1c w/o eAG  Result Value Ref Range   Hgb A1c MFr Bld 5.6 4.8 - 5.6 %      The ASCVD Risk score (Arnett DK, et al., 2019) failed to calculate for the following reasons:   The 2019 ASCVD risk score is only valid for ages 10 to 54   Risk score cannot be calculated because patient has a medical history suggesting prior/existing ASCVD    Assessment & Plan:  History of CVA (cerebrovascular accident) Assessment & Plan: Hx of left PCA stroke in 2019. Found to have PAF. Has gait abnormalities, unclear if this is a residual deficit. Lipid panel and A1c. Start atorvastatin  40 mg daily.   Orders: -     AMB Referral [facility]BCI Care Management  Paroxysmal atrial fibrillation [facility]) Assessment & Plan: Afib with rate of 111 on ECG today. Previously seen cardiology and discussed anticoagulation with cardiology given hx of stroke in 2019 but she never started this. CHA2DS2-VASc Score = 6 for age, hypertension, hx of stroke, and sex. HAS-BLED of 3. Discussed risks and benefit of anticoagulation and she is open to this. Will send in low dose of Eliquis  given age and weight. Referral made to cardiology    Orders: -     AMB Referral VBCI Care Management -     EKG 12-Lead -     Ambulatory referral to Cardiology  Lower extremity edema -     US  Venous Img Lower Unilateral Right (DVT); Future  Leg edema, right Assessment & Plan: Bilateral edema however R> significantly greater than the left. She reports 2 weeks ago legs were both significantly swollen and she did not feel well although denies claudication symptoms. Given leg size discrepancy will order DVT study of the RLE to rule out DVT.     Primary hypertension Assessment & Plan: Uncontrolled today at 160/98, previously on amlodipine  although it is unclear if she ever took this. Start olmesartan 20 mg daily. Follow up in 2 weeks    Other orders -     Olmesartan Medoxomil;  Take 1 tablet (20 mg total) by mouth daily.  Dispense: 30 tablet; Refill: 1     Return in about 2 weeks (around 04/12/2024) for HTN.    Harlene Saddler, MD

## 2024-03-30 LAB — CBC WITH DIFFERENTIAL/PLATELET
Basophils Absolute: 0 x10E3/uL (ref 0.0–0.2)
Basos: 0 %
EOS (ABSOLUTE): 0.1 x10E3/uL (ref 0.0–0.4)
Eos: 1 %
Hematocrit: 40.4 % (ref 34.0–46.6)
Hemoglobin: 13.3 g/dL (ref 11.1–15.9)
Immature Grans (Abs): 0 x10E3/uL (ref 0.0–0.1)
Immature Granulocytes: 0 %
Lymphocytes Absolute: 1.3 x10E3/uL (ref 0.7–3.1)
Lymphs: 25 %
MCH: 33.1 pg — ABNORMAL HIGH (ref 26.6–33.0)
MCHC: 32.9 g/dL (ref 31.5–35.7)
MCV: 101 fL — ABNORMAL HIGH (ref 79–97)
Monocytes Absolute: 0.4 x10E3/uL (ref 0.1–0.9)
Monocytes: 8 %
Neutrophils Absolute: 3.3 x10E3/uL (ref 1.4–7.0)
Neutrophils: 66 %
Platelets: 134 x10E3/uL — ABNORMAL LOW (ref 150–450)
RBC: 4.02 x10E6/uL (ref 3.77–5.28)
RDW: 12.5 % (ref 11.7–15.4)
WBC: 5.1 x10E3/uL (ref 3.4–10.8)

## 2024-03-30 LAB — COMPREHENSIVE METABOLIC PANEL WITH GFR
ALT: 41 IU/L — ABNORMAL HIGH (ref 0–32)
AST: 48 IU/L — ABNORMAL HIGH (ref 0–40)
Albumin: 4.5 g/dL (ref 3.7–4.7)
Alkaline Phosphatase: 87 IU/L (ref 48–129)
BUN/Creatinine Ratio: 18 (ref 12–28)
BUN: 14 mg/dL (ref 8–27)
Bilirubin Total: 0.6 mg/dL (ref 0.0–1.2)
CO2: 23 mmol/L (ref 20–29)
Calcium: 9.7 mg/dL (ref 8.7–10.3)
Chloride: 105 mmol/L (ref 96–106)
Creatinine, Ser: 0.79 mg/dL (ref 0.57–1.00)
Globulin, Total: 2.2 g/dL (ref 1.5–4.5)
Glucose: 93 mg/dL (ref 70–99)
Potassium: 5.6 mmol/L — ABNORMAL HIGH (ref 3.5–5.2)
Sodium: 142 mmol/L (ref 134–144)
Total Protein: 6.7 g/dL (ref 6.0–8.5)
eGFR: 73 mL/min/1.73 (ref >59)

## 2024-03-30 LAB — LIPID PANEL WITH LDL/HDL RATIO
Cholesterol, Total: 188 mg/dL (ref 100–199)
HDL: 87 mg/dL (ref >39)
LDL Chol Calc (NIH): 92 mg/dL (ref 0–99)
LDL/HDL Ratio: 1.1 ratio (ref 0.0–3.2)
Triglycerides: 48 mg/dL (ref 0–149)
VLDL Cholesterol Cal: 9 mg/dL (ref 5–40)

## 2024-03-30 LAB — HGB A1C W/O EAG: Hgb A1c MFr Bld: 5.6 % (ref 4.8–5.6)

## 2024-04-01 ENCOUNTER — Encounter: Payer: Self-pay | Admitting: Intensive Care

## 2024-04-01 ENCOUNTER — Other Ambulatory Visit: Payer: Self-pay | Admitting: Student

## 2024-04-01 ENCOUNTER — Ambulatory Visit: Payer: Self-pay | Admitting: Student

## 2024-04-01 ENCOUNTER — Telehealth: Payer: Self-pay

## 2024-04-01 MED ORDER — RIVAROXABAN 20 MG PO TABS: 20.0000 mg | ORAL_TABLET | Freq: Every day | ORAL | 1 refills | Status: DC

## 2024-04-01 NOTE — Telephone Encounter (Signed)
 Spoke with patient's niece, verbalized understanding and will have patient's daughter call the insurance company and call the office back once they get an answer from [employer].

## 2024-04-01 NOTE — Telephone Encounter (Signed)
 Copied from CRM #[id number]. Topic: Clinical - Medication Question >> Apr 01, 2024 10:12 AM Hadassah PARAS wrote: Reason for CRM: Pt's blood thinner medication is expensive and pt is unable to obtain. The original medication that was prescribed was around $300 and the new one sent in to replace is $450. Pt would like a medication that is covered by insurance. Please advise Cena [phone]

## 2024-04-01 NOTE — Telephone Encounter (Signed)
 Please review

## 2024-04-02 NOTE — Telephone Encounter (Unsigned)
 Copied from CRM [id number]. Topic: Clinical - Medication Question >> Apr 02, 2024  4:24 PM Delon DASEN wrote: Reason for CRM: Insurance stated Warfarin or Coumadin would be no cost to the patient, patient is taking baby aspirin , Miranda asking if that is ok.  Please call [phone]

## 2024-04-02 NOTE — Telephone Encounter (Signed)
 Referral placed.

## 2024-04-03 ENCOUNTER — Other Ambulatory Visit: Payer: Self-pay | Admitting: Pharmacist

## 2024-04-03 ENCOUNTER — Other Ambulatory Visit (HOSPITAL_COMMUNITY): Payer: Self-pay

## 2024-04-03 DIAGNOSIS — Z8673 Personal history of transient ischemic attack (TIA), and cerebral infarction without residual deficits: Secondary | ICD-10-CM

## 2024-04-03 MED ORDER — ATORVASTATIN CALCIUM 40 MG PO TABS: 40.0000 mg | ORAL_TABLET | Freq: Every day | ORAL | 3 refills | Status: DC

## 2024-04-03 NOTE — Telephone Encounter (Signed)
 Spoke with patient's niece, Cena. Discussed Dr. Maximiano recommendations and verbalized understanding and will have patient continue to do the low dose aspirin  until seeing cardiology on 04/18/24.

## 2024-04-03 NOTE — Progress Notes (Unsigned)
   04/03/2024 Name: [name] MRN: [id number] DOB: 03-23-1936  Chief Complaint  Patient presents with   Medication Assistance    [name] is a 88 y.o. year old female who was referred to the pharmacist by their PCP for assistance in managing medication access.   Today reach both patient's niece, Cena Na and daughter, Randine Slade, (both listed on DPR in chart), as requested to follow up regarding medication access.   Subjective:  Care Team: Primary Care Provider: Lemon Raisin, MD ; Next Scheduled Visit: 04/12/2024 Cardiologist: Argentina Clap, MD; Next Scheduled Visit: 04/18/2024   Medication Access/Adherence  Current Pharmacy:  [id number] [place], [place] - [address] [place] [address] Phone: [phone] Fax: [phone]   Patient reports affordability concerns with their medications: Yes  Patient reports access/transportation concerns to their pharmacy: No  Patient reports adherence concerns with their medications:  No    Patient's niece advises that patient was previously advised by Cardiologist in 2019 to start Eliquis , but refused. States patient now willing to consider starting anticoagulant if affordable. Reports cost of either Eliquis  or Xarelto through patient's Medicare prescription coverage is >$300 and that per patient this cost is unaffordable even if cost goes down after her annual deductible    Objective:  Lab Results  Component Value Date   CREATININE 0.79 03/29/2024   BUN 14 03/29/2024   NA 142 03/29/2024   K 5.6 (H) 03/29/2024   CL 105 03/29/2024   CO2 23 03/29/2024    Lab Results  Component Value Date   CHOL 188 03/29/2024   HDL 87 03/29/2024   LDLCALC 92 03/29/2024   TRIG 48 03/29/2024   CHOLHDL 2.7 06/16/2017    Current Outpatient Medications on File Prior to Visit  Medication Sig Dispense Refill   atorvastatin  (LIPITOR) 40 MG tablet Take 1 tablet (40 mg total) by mouth daily. 90 tablet 3    olmesartan (BENICAR) 20 MG tablet Take 1 tablet (20 mg total) by mouth daily. 30 tablet 1   rivaroxaban (XARELTO) 20 MG TABS tablet Take 1 tablet (20 mg total) by mouth daily with supper. 90 tablet 1   No current facility-administered medications on file prior to visit.     Assessment/Plan:   Atrial Fibrillation: - Currently uncontrolled - Reviewed importance of adherence to anticoagulant for stroke prevention. - Based on reported income from daughter, patient meets eligibility criteria for Xarelto patient assistance program from [employer] Will collaborate with PCP and CPhT to support patient with applying for patient assistance program - Will collaborate with PCP to see if office has a sample of Xarelto to offer patient while waiting on approval/supply for assistance program  Follow Up Plan: Clinical Pharmacist will follow up with patient/caregivers by telephone again within the next 3 weeks  [name], PharmD, [facility] [phone]

## 2024-04-04 ENCOUNTER — Encounter: Payer: Self-pay | Admitting: Student

## 2024-04-04 ENCOUNTER — Telehealth: Payer: Self-pay

## 2024-04-04 DIAGNOSIS — R600 Localized edema: Secondary | ICD-10-CM | POA: Insufficient documentation

## 2024-04-04 NOTE — Telephone Encounter (Signed)
 PAP: Patient assistance application for Xarelto through [employer] ([employer]) has been mailed to pt's home address on file. Provider portion of application will be faxed to provider's office.

## 2024-04-04 NOTE — Telephone Encounter (Signed)
 Received Provider portion PAP application [employer] for Xarelto

## 2024-04-04 NOTE — Assessment & Plan Note (Signed)
 Bilateral edema however R> significantly greater than the left. She reports 2 weeks ago legs were both significantly swollen and she did not feel well although denies claudication symptoms. Given leg size discrepancy will order DVT study of the RLE to rule out DVT.

## 2024-04-04 NOTE — Assessment & Plan Note (Addendum)
 Hx of left PCA stroke in 2019. Found to have PAF. Has gait abnormalities, unclear if this is a residual deficit. Lipid panel and A1c. Start atorvastatin  40 mg daily.

## 2024-04-04 NOTE — Assessment & Plan Note (Signed)
 Uncontrolled today at 160/98, previously on amlodipine  although it is unclear if she ever took this. Start olmesartan 20 mg daily. Follow up in 2 weeks

## 2024-04-04 NOTE — Assessment & Plan Note (Addendum)
 Afib with rate of 111 on ECG today. Previously seen cardiology and discussed anticoagulation with cardiology given hx of stroke in 2019 but she never started this. CHA2DS2-VASc Score = 6 for age, hypertension, hx of stroke, and sex. HAS-BLED of 3. Discussed risks and benefit of anticoagulation and she is open to this. Will send in low dose of Eliquis  given age and weight. Referral made to cardiology

## 2024-04-05 ENCOUNTER — Ambulatory Visit
Admission: RE | Admit: 2024-04-05 | Discharge: 2024-04-05 | Disposition: A | Discharge status: Home / Self Care | Source: Ambulatory Visit | Attending: Student | Admitting: Student

## 2024-04-05 ENCOUNTER — Other Ambulatory Visit: Payer: Self-pay | Admitting: Student

## 2024-04-05 ENCOUNTER — Other Ambulatory Visit: Payer: Self-pay | Admitting: Pharmacist

## 2024-04-05 DIAGNOSIS — R600 Localized edema: Secondary | ICD-10-CM | POA: Insufficient documentation

## 2024-04-05 DIAGNOSIS — I480 Paroxysmal atrial fibrillation: Secondary | ICD-10-CM

## 2024-04-05 MED ORDER — RIVAROXABAN 20 MG PO TABS: 20.0000 mg | ORAL_TABLET | Freq: Every day | ORAL | 1 refills | Status: AC

## 2024-04-05 NOTE — Progress Notes (Signed)
   04/05/2024  Patient ID: [name], female   DOB: February 15, 1936, 88 y.o.   MRN: [id number]  Sent message to PCP to see if office has a sample of Xarelto to offer patient while waiting on approval/supply for assistance program   PCP advises that office does not have samples of Xarelto  Collaborate with [employer] representative and obtain a 30-day Free Trial Xarelto Card for patient: BIN: [id number] GROUP: [id number] PCN: PDMI ID: [id number]  [facility] [place]. Provide savings card information to [facility] who bills this through and confirms that patient can pick up the 30 day supply for no charge - Provide update to patient's niece  [name], PharmD, [facility] [phone]

## 2024-04-05 NOTE — Patient Instructions (Signed)
 Please watch the mail for an envelope from [facility] containing the patient assistance program application. Please complete this application and bring to office to have it faxed back to Attention: Palmer Bobo at Fax # [phone] along with a copy of your Medicare Part D prescription card and a copy of your proof of income document.   If you need to call Jullie Oiler, you can reach her at [phone].   Thank you!   Arthur Lash, PharmD, [facility] [phone]

## 2024-04-08 ENCOUNTER — Ambulatory Visit: Payer: Self-pay | Admitting: Student

## 2024-04-08 NOTE — Progress Notes (Signed)
 Spoke with patient and she was very thankful for results. She verbalized understanding  JM

## 2024-04-11 ENCOUNTER — Ambulatory Visit (INDEPENDENT_AMBULATORY_CARE_PROVIDER_SITE_OTHER): Admitting: Emergency Medicine

## 2024-04-11 VITALS — Ht 63.0 in | Wt 114.0 lb

## 2024-04-11 DIAGNOSIS — Z0000 Encounter for general adult medical examination without abnormal findings: Secondary | ICD-10-CM | POA: Diagnosis not present

## 2024-04-11 NOTE — Patient Instructions (Signed)
 [name],  Thank you for taking the time for your Medicare Wellness Visit. I appreciate your continued commitment to your health goals. Please review the care plan we discussed, and feel free to reach out if I can assist you further.  Medicare recommends these wellness visits once per year to help you and your care team stay ahead of potential health issues. These visits are designed to focus on prevention, allowing your provider to concentrate on managing your acute and chronic conditions during your regular appointments.  Please note that Annual Wellness Visits do not include a physical exam. Some assessments may be limited, especially if the visit was conducted virtually. If needed, we may recommend a separate in-person follow-up with your provider.  Ongoing Care Seeing your primary care provider every 3 to 6 months helps us  monitor your health and provide consistent, personalized care.   Referrals If a referral was made during today's visit and you haven't received any updates within two weeks, please contact the referred provider directly to check on the status.  Recommended Screenings:  Health Maintenance  Topic Date Due   DTaP/Tdap/Td vaccine (1 - Tdap) Never done   Pneumococcal Vaccine for age over 54 (1 of 1 - PCV) Never done   Zoster (Shingles) Vaccine (1 of 2) Never done   DEXA scan (bone density measurement)  Never done   Flu Shot  09/10/2024*   COVID-19 Vaccine (1 - 2025-26 season) 04/14/2025*   Medicare Annual Wellness Visit  04/11/2025   Meningitis B Vaccine  Aged Out  *Topic was postponed. The date shown is not the original due date.       04/11/2024    8:53 AM  Advanced Directives  Does Patient Have a Medical Advance Directive? Yes  Type of Estate Agent of [place];Living will  Does patient want to make changes to medical advance directive? No - Patient declined  Copy of Healthcare Power of Attorney in Chart? No - copy requested    Advance Care Planning is important because it: Ensures you receive medical care that aligns with your values, goals, and preferences. Provides guidance to your family and loved ones, reducing the emotional burden of decision-making during critical moments.  Vision: Annual vision screenings are recommended for early detection of glaucoma, cataracts, and diabetic retinopathy. These exams can also reveal signs of chronic conditions such as diabetes and high blood pressure.  There are several [facility] in your area. Here are a few that usually accept all insurance types:  [facility] [address] [place], [place] [address] Phone: [phone]  [facility] [address] [place], [place] [address] Phone: [phone]  [facility] [address] [place], [place] [address] Phone: [phone]  [facility] [address] [place], [place] [address] Phone: [phone]  [facility] [address] [place], [place] [address] Phone: [phone]  [facility] [address] [place], [place] [address] Phone: [phone]    Dental: Annual dental screenings help detect early signs of oral cancer, gum disease, and other conditions linked to overall health, including heart disease and diabetes.  Please see the attached documents for additional preventive care recommendations.   Fall Prevention in the Home, Adult Falls can cause injuries and affect people of all ages. There are many simple things that you can do to make your home safe and to help prevent falls. If you need it, ask for help making these changes. What actions can I take to  prevent falls? General information Use good lighting in all rooms. Make sure to: Replace any light bulbs that burn out. Turn on lights if it is dark and use night-lights. Keep items that you use often in easy-to-reach places. Lower the shelves around your home if needed. Move furniture so that there are clear paths around  it. Do not keep throw rugs or other things on the floor that can make you trip. If any of your floors are uneven, fix them. Add color or contrast paint or tape to clearly mark and help you see: Grab bars or handrails. First and last steps of staircases. Where the edge of each step is. If you use a ladder or stepladder: Make sure that it is fully opened. Do not climb a closed ladder. Make sure the sides of the ladder are locked in place. Have someone hold the ladder while you use it. Know where your pets are as you move through your home. What can I do in the bathroom?     Keep the floor dry. Clean up any water that is on the floor right away. Remove soap buildup in the bathtub or shower. Buildup makes bathtubs and showers slippery. Use non-skid mats or decals on the floor of the bathtub or shower. Attach bath mats securely with double-sided, non-slip rug tape. If you need to sit down while you are in the shower, use a non-slip stool. Install grab bars by the toilet and in the bathtub and shower. Do not use towel bars as grab bars. What can I do in the bedroom? Make sure that you have a light by your bed that is easy to reach. Do not use any sheets or blankets on your bed that hang to the floor. Have a firm bench or chair with side arms that you can use for support when you get dressed. What can I do in the kitchen? Clean up any spills right away. If you need to reach something above you, use a sturdy step stool that has a grab bar. Keep electrical cables out of the way. Do not use floor polish or wax that makes floors slippery. What can I do with my stairs? Do not leave anything on the stairs. Make sure that you have a light switch at the top and the bottom of the stairs. Have them installed if you do not have them. Make sure that there are handrails on both sides of the stairs. Fix handrails that are broken or loose. Make sure that handrails are as long as the staircases. Install  non-slip stair treads on all stairs in your home if they do not have carpet. Avoid having throw rugs at the top or bottom of stairs, or secure the rugs with carpet tape to prevent them from moving. Choose a carpet design that does not hide the edge of steps on the stairs. Make sure that carpet is firmly attached to the stairs. Fix any carpet that is loose or worn. What can I do on the outside of my home? Use bright outdoor lighting. Repair the edges of walkways and driveways and fix any cracks. Clear paths of anything that can make you trip, such as tools or rocks. Add color or contrast paint or tape to clearly mark and help you see high doorway thresholds. Trim any bushes or trees on the main path into your home. Check that handrails are securely fastened and in good repair. Both sides of all steps should have handrails. Install guardrails along the  edges of any raised decks or porches. Have leaves, snow, and ice cleared regularly. Use sand, salt, or ice melt on walkways during winter months if you live where there is ice and snow. In the garage, clean up any spills right away, including grease or oil spills. What other actions can I take? Review your medicines with your health care provider. Some medicines can make you confused or feel dizzy. This can increase your chance of falling. Wear closed-toe shoes that fit well and support your feet. Wear shoes that have rubber soles and low heels. Use a cane, walker, scooter, or crutches that help you move around if needed. Talk with your provider about other ways that you can decrease your risk of falls. This may include seeing a physical therapist to learn to do exercises to improve movement and strength. Where to find more information Centers for Disease Control and Prevention, [employer]AD[employer]: tonerpromos.no [employer] on Aging: baseringtones.pl National Institute on Aging: baseringtones.pl Contact a health care provider if: You are afraid of falling at home. You  feel weak, drowsy, or dizzy at home. You fall at home. Get help right away if you: Lose consciousness or have trouble moving after a fall. Have a fall that causes a head injury. These symptoms may be an emergency. Get help right away. Call 911. Do not wait to see if the symptoms will go away. Do not drive yourself to the hospital. This information is not intended to replace advice given to you by your health care provider. Make sure you discuss any questions you have with your health care provider. Document Revised: 01/31/2022 Document Reviewed: 01/31/2022 Elsevier Patient Education  2024 [employer].

## 2024-04-11 NOTE — Progress Notes (Signed)
 Subjective:   [name] is a 88 y.o. who presents for a Medicare Wellness preventive visit.  As a reminder, Annual Wellness Visits don't include a physical exam, and some assessments may be limited, especially if this visit is performed virtually. We may recommend an in-person follow-up visit with your provider if needed.  Visit Complete: Virtual I connected with  [name] on 04/11/24 by a audio enabled telemedicine application and verified that I am speaking with the correct person using two identifiers.  Patient Location: Home  Provider Location: Home Office  I discussed the limitations of evaluation and management by telemedicine. The patient expressed understanding and agreed to proceed.  Vital Signs: Because this visit was a virtual/telehealth visit, some criteria may be missing or patient reported. Any vitals not documented were not able to be obtained and vitals that have been documented are patient reported.  VideoDeclined- This patient declined Librarian, academic. Therefore the visit was completed with audio only.  Persons Participating in Visit: Patient.  AWV Questionnaire: No: Patient Medicare AWV questionnaire was not completed prior to this visit.  Cardiac Risk Factors include: advanced age (>66men, >19 women);hypertension;sedentary lifestyle;dyslipidemia     Objective:    Today's Vitals   04/11/24 0835  Weight: 114 lb (51.7 kg)  Height: 5' 3 (1.6 m)   Body mass index is 20.19 kg/m.     04/11/2024    8:53 AM 03/15/2019    7:49 AM 06/22/2018   10:28 AM 07/25/2017    4:25 PM 07/11/2017    2:00 PM 07/11/2017    9:44 AM 07/03/2017   11:01 AM  Advanced Directives  Does Patient Have a Medical Advance Directive? Yes Yes Yes  Yes  Yes  Yes  Yes   Type of Estate Agent of [place];Living will Living will Living will;Healthcare Power of Attorney  Living will Living will Living will  Does patient want to make  changes to medical advance directive? No - Patient declined    No - Patient declined     Copy of Healthcare Power of Attorney in Chart? No - copy requested  No - copy requested       Would patient like information on creating a medical advance directive?  No - Patient declined   No - Patient declined  No - Patient declined       Data saved with a previous flowsheet row definition    Current Medications (verified) Outpatient Encounter Medications as of 04/11/2024  Medication Sig   aspirin  EC 81 MG tablet Take 81 mg by mouth daily. Swallow whole.   atorvastatin  (LIPITOR) 40 MG tablet Take 1 tablet (40 mg total) by mouth daily.   olmesartan (BENICAR) 20 MG tablet Take 1 tablet (20 mg total) by mouth daily.   rivaroxaban (XARELTO) 20 MG TABS tablet Take 1 tablet (20 mg total) by mouth daily with supper. (Patient not taking: Reported on 04/11/2024)   No facility-administered encounter medications on file as of 04/11/2024.    Allergies (verified) Patient has no known allergies.   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   Stroke [facility])    Past Surgical History:  Procedure Laterality Date   LOOP RECORDER INSERTION N/A 06/20/2017   Procedure: LOOP RECORDER INSERTION;  Surgeon: Kelsie Agent, MD;  Location: [facility] INVASIVE CV LAB;  Service: Cardiovascular;  Laterality: N/A;   TEE WITHOUT CARDIOVERSION N/A 06/19/2017   Procedure: TRANSESOPHAGEAL  ECHOCARDIOGRAM (TEE) WITH LOOP;  Surgeon: Jeffrie Oneil BROCKS, MD;  Location: [facility] ENDOSCOPY;  Service: Cardiovascular;  Laterality: N/A;   Family History  Problem Relation Age of Onset   Other Mother        unknown medical history   Heart attack Father    Breast cancer Sister    Breast cancer Brother    Social History   Socioeconomic History   Marital status: Widowed    Spouse name: Not on file   Number of children: 2   Years of education: Not on file   Highest education level: Not on file   Occupational History   Not on file  Tobacco Use   Smoking status: Never   Smokeless tobacco: Never   Tobacco comments:    None  Vaping Use   Vaping status: Never Used  Substance and Sexual Activity   Alcohol use: Never    Comment: None   Drug use: No    Comment: None   Sexual activity: Never  Other Topics Concern   Not on file  Social History Narrative   Not on file   Social Drivers of Health   Financial Resource Strain: Low Risk  (04/11/2024)   Overall Financial Resource Strain (CARDIA)    Difficulty of Paying Living Expenses: Not hard at all  Food Insecurity: No Food Insecurity (04/11/2024)   Hunger Vital Sign    Worried About Running Out of Food in the Last Year: Never true    Ran Out of Food in the Last Year: Never true  Transportation Needs: No Transportation Needs (04/11/2024)   PRAPARE - Administrator, Civil Service (Medical): No    Lack of Transportation (Non-Medical): No  Physical Activity: Inactive (04/11/2024)   Exercise Vital Sign    Days of Exercise per Week: 0 days    Minutes of Exercise per Session: 0 min  Stress: No Stress Concern Present (04/11/2024)   [employer] of Occupational Health - Occupational Stress Questionnaire    Feeling of Stress: Only a little  Social Connections: Socially Isolated (04/11/2024)   Social Connection and Isolation Panel    Frequency of Communication with Friends and Family: Once a week    Frequency of Social Gatherings with Friends and Family: Three times a week    Attends Religious Services: Never    Active Member of Clubs or Organizations: No    Attends Banker Meetings: Never    Marital Status: Widowed    Tobacco Counseling Counseling given: Not Answered Tobacco comments: None    Clinical Intake:  Pre-visit preparation completed: Yes  Pain : No/denies pain     BMI - recorded: 20.19 Nutritional Status: BMI of 19-24  Normal Nutritional Risks: None Diabetes: No  Lab  Results  Component Value Date   HGBA1C 5.6 03/29/2024   HGBA1C 5.2 06/16/2017     How often do you need to have someone help you when you read instructions, pamphlets, or other written materials from your doctor or pharmacy?: 3 - Sometimes (daughter helps with reading)  Interpreter Needed?: No  Information entered by :: Vina Ned, CMA   Activities of Daily Living     04/11/2024    8:40 AM  In your present state of health, do you have any difficulty performing the following activities:  Hearing? 1  Comment wears hearing aids  Vision? 0  Difficulty concentrating or making decisions? 1  Comment hx of CVA  Walking or climbing stairs? 1  Dressing or  bathing? 0  Doing errands, shopping? 1  Comment daughter takes to appointments  Preparing Food and eating ? N  Using the Toilet? N  In the past six months, have you accidently leaked urine? N  Do you have problems with loss of bowel control? N  Managing your Medications? N  Managing your Finances? Y  Comment daughter helps  Housekeeping or managing your Housekeeping? N    Patient Care Team: Lemon Raisin, MD as PCP - General (Internal Medicine)  I have updated your Care Teams any recent Medical Services you may have received from other providers in the past year.     Assessment:   This is a routine wellness examination for Lilyian.  Hearing/Vision screen Hearing Screening - Comments:: Wears hearing aids  Vision Screening - Comments:: Recommended routine eye exam. Included list of eye doctors in [facility]   Goals Addressed               This Visit's Progress     Patient Stated (pt-stated)        Be able to drive and take care of myself       Depression Screen     04/11/2024    8:51 AM 03/29/2024    1:56 PM 07/18/2017    1:23 PM  PHQ 2/9 Scores  PHQ - 2 Score 0 0 0  PHQ- 9 Score 3 3     Fall Risk     04/11/2024    8:55 AM 03/29/2024    1:56 PM 07/18/2017    1:23 PM  Fall Risk   Falls in the past year? 1 0  No   Number falls in past yr: 0 0   Injury with Fall? 1 0   Risk for fall due to : History of fall(s);Impaired balance/gait;Orthopedic patient No Fall Risks   Follow up Falls evaluation completed;Education provided Falls evaluation completed      Data saved with a previous flowsheet row definition    MEDICARE RISK AT HOME:  Medicare Risk at Home Any stairs in or around the home?: Yes If so, are there any without handrails?: No Home free of loose throw rugs in walkways, pet beds, electrical cords, etc?: Yes Adequate lighting in your home to reduce risk of falls?: Yes Life alert?: No Use of a cane, walker or w/c?: No Grab bars in the bathroom?: Yes Shower chair or bench in shower?: Yes Elevated toilet seat or a handicapped toilet?: Yes  TIMED UP AND GO:  Was the test performed?  No  Cognitive Function: 6CIT completed        04/11/2024    8:57 AM  6CIT Screen  What Year? 0 points  What month? 0 points  What time? 0 points  Count back from 20 0 points  Months in reverse 4 points  Repeat phrase 6 points  Total Score 10 points    Immunizations  There is no immunization history on file for this patient.  Screening Tests Health Maintenance  Topic Date Due   DTaP/Tdap/Td (1 - Tdap) Never done   Pneumococcal Vaccine: 50+ Years (1 of 1 - PCV) Never done   Zoster Vaccines- Shingrix (1 of 2) Never done   DEXA SCAN  Never done   Influenza Vaccine  09/10/2024 (Originally 01/12/2024)   COVID-19 Vaccine (1 - 2025-26 season) 04/14/2025 (Originally 02/12/2024)   Medicare Annual Wellness (AWV)  04/11/2025   Meningococcal B Vaccine  Aged Out    Health Maintenance Items Addressed: See Nurse Notes  at the end of this note  Additional Screening:  Vision Screening: Recommended annual ophthalmology exams for early detection of glaucoma and other disorders of the eye. Is the patient up to date with their annual eye exam?  No  Who is the provider or what is the name of the office in  which the patient attends annual eye exams? Included list of eye doctors in [facility]  Dental Screening: Recommended annual dental exams for proper oral hygiene  Community Resource Referral / Chronic Care Management: CRR required this visit?  No   CCM required this visit?  No   Plan:    I have personally reviewed and noted the following in the patient's chart:   Medical and social history Use of alcohol, tobacco or illicit drugs  Current medications and supplements including opioid prescriptions. Patient is not currently taking opioid prescriptions. Functional ability and status Nutritional status Physical activity Advanced directives List of other physicians Hospitalizations, surgeries, and ER visits in previous 12 months Vitals Screenings to include cognitive, depression, and falls Referrals and appointments  In addition, I have reviewed and discussed with patient certain preventive protocols, quality metrics, and best practice recommendations. A written personalized care plan for preventive services as well as general preventive health recommendations were provided to patient.   Vina Ned, CMA   04/11/2024   After Visit Summary: (Mail) Due to this being a telephonic visit, the after visit summary with patients personalized plan was offered to patient via mail   Notes:  6 CIT Score - 10 Declined all vaccines Needs routine eye exam. Included list of eye doctors in [facility] Screening colonoscopy, MMG and DEXA scan no longer recommended due to age.

## 2024-04-12 ENCOUNTER — Encounter: Payer: Self-pay | Admitting: Student

## 2024-04-12 ENCOUNTER — Ambulatory Visit: Admitting: Student

## 2024-04-12 VITALS — BP 126/82 | HR 82 | Ht 63.0 in | Wt 119.0 lb

## 2024-04-12 DIAGNOSIS — I10 Essential (primary) hypertension: Secondary | ICD-10-CM

## 2024-04-12 DIAGNOSIS — R600 Localized edema: Secondary | ICD-10-CM

## 2024-04-12 DIAGNOSIS — D696 Thrombocytopenia, unspecified: Secondary | ICD-10-CM | POA: Diagnosis not present

## 2024-04-12 DIAGNOSIS — R7401 Elevation of levels of liver transaminase levels: Secondary | ICD-10-CM

## 2024-04-12 DIAGNOSIS — R413 Other amnesia: Secondary | ICD-10-CM

## 2024-04-12 DIAGNOSIS — B351 Tinea unguium: Secondary | ICD-10-CM | POA: Diagnosis not present

## 2024-04-12 DIAGNOSIS — I480 Paroxysmal atrial fibrillation: Secondary | ICD-10-CM

## 2024-04-12 MED ORDER — ATORVASTATIN CALCIUM 40 MG PO TABS: 40.0000 mg | ORAL_TABLET | Freq: Every day | ORAL | 3 refills | Status: AC

## 2024-04-12 MED ORDER — OLMESARTAN MEDOXOMIL 20 MG PO TABS: 20.0000 mg | ORAL_TABLET | Freq: Every day | ORAL | 3 refills | Status: AC

## 2024-04-12 NOTE — Assessment & Plan Note (Addendum)
 BP much better today. On olmesartan 20 mg daily. Uses sea salt rarely and canned vegetables. Discussed continuing medications for BP as she is tolerating this well. She will consider this. Please refer to problem based charting for further details and assessment and plan of current problem and chronic medical conditions.

## 2024-04-12 NOTE — Patient Instructions (Signed)
  We hope you enjoyed your visit with our office! Your feedback means so much to our team, and it helps us  to continue providing the best care possible. If you had a positive experience, we'd love if you could share it by leaving us  a [employer] Review and also completing our patient survey that you'll receive soon.  Your kind words not only brighten our day but also help other patients feel confident in choosing our office for their care.  Thank you for being a part of our practice family!   Dr. [facility]  Primary Care & [facility] [address]  [place] [address] Office [phone]  Fax: [phone]'

## 2024-04-12 NOTE — Telephone Encounter (Signed)
 PAP: Application for Xarelto  has been submitted to [employer] ([employer]), via fax

## 2024-04-13 LAB — COMPREHENSIVE METABOLIC PANEL WITH GFR
ALT: 68 IU/L — ABNORMAL HIGH (ref 0–32)
AST: 60 IU/L — ABNORMAL HIGH (ref 0–40)
Albumin: 4.5 g/dL (ref 3.7–4.7)
Alkaline Phosphatase: 101 IU/L (ref 48–129)
BUN/Creatinine Ratio: 25 (ref 12–28)
BUN: 21 mg/dL (ref 8–27)
Bilirubin Total: 0.9 mg/dL (ref 0.0–1.2)
CO2: 23 mmol/L (ref 20–29)
Calcium: 9.8 mg/dL (ref 8.7–10.3)
Chloride: 103 mmol/L (ref 96–106)
Creatinine, Ser: 0.85 mg/dL (ref 0.57–1.00)
Globulin, Total: 1.9 g/dL (ref 1.5–4.5)
Glucose: 114 mg/dL — ABNORMAL HIGH (ref 70–99)
Potassium: 4.2 mmol/L (ref 3.5–5.2)
Sodium: 141 mmol/L (ref 134–144)
Total Protein: 6.4 g/dL (ref 6.0–8.5)
eGFR: 67 mL/min/1.73 (ref >59)

## 2024-04-13 LAB — HEPATITIS B SURFACE ANTIBODY,QUALITATIVE: Hep B Surface Ab, Qual: NONREACTIVE

## 2024-04-13 LAB — PROTIME-INR
INR: 1 (ref 0.9–1.2)
Prothrombin Time: 10.8 s (ref 9.1–12.0)

## 2024-04-13 LAB — IRON,TIBC AND FERRITIN PANEL
Ferritin: 144 ng/mL (ref 15–150)
Iron Saturation: 27 % (ref 15–55)
Iron: 88 ug/dL (ref 27–139)
Total Iron Binding Capacity: 332 ug/dL (ref 250–450)
UIBC: 244 ug/dL (ref 118–369)

## 2024-04-13 LAB — HEPATITIS B CORE ANTIBODY, TOTAL: Hep B Core Total Ab: NEGATIVE

## 2024-04-13 LAB — HEPATITIS B SURFACE ANTIGEN: Hepatitis B Surface Ag: NEGATIVE

## 2024-04-13 LAB — HEPATITIS C ANTIBODY: Hep C Virus Ab: NONREACTIVE

## 2024-04-13 LAB — VITAMIN B12: Vitamin B-12: 217 pg/mL — ABNORMAL LOW (ref 232–1245)

## 2024-04-13 LAB — TSH: TSH: 1.26 u[IU]/mL (ref 0.450–4.500)

## 2024-04-15 ENCOUNTER — Ambulatory Visit: Payer: Self-pay | Admitting: Student

## 2024-04-16 DIAGNOSIS — B351 Tinea unguium: Secondary | ICD-10-CM | POA: Insufficient documentation

## 2024-04-16 DIAGNOSIS — R413 Other amnesia: Secondary | ICD-10-CM | POA: Insufficient documentation

## 2024-04-16 DIAGNOSIS — R7401 Elevation of levels of liver transaminase levels: Secondary | ICD-10-CM | POA: Insufficient documentation

## 2024-04-16 NOTE — Progress Notes (Unsigned)
 Cardiology Office Note   Date:  04/18/2024  ID:  [name], DOB [birth year], MRN [id number] PCP: Lemon Raisin, MD  [facility] Providers Cardiologist:  Caron Poser, MD     History of Present Illness [name] is a 88 y.o. female PMH paroxysmal atrial fibrillation, history of left posterior circulation stroke 2019 who presents for further evaluation management of atrial fibrillation.  Patient recently seen by PCP for this issue.  She was noted to be in atrial fibrillation with heart rate of 111 at recent appointment.  Low-dose Eliquis  was started given age and body weight.  This was unaffordable, so Xarelto was started instead.  Last LDL 92 03/2024.  On interview today, patient tells me she is really not interested in any medications or procedures at all.  We discussed the risk of stroke with atrial fibrillation without anticoagulation; she says that she is willing to accept this risk and does not wish to be on blood thinners.  We also discussed left atrial appendage occlusion devices and she is not interested in this either.  Discussed obtaining an echocardiogram, she is not interested in this.  She is, however, amenable to a monitor to assess her mean heart rates.  Relevant CVD History -TEE 06/2017 normal LV function, moderate anterior mitral valve prolapse with mild MR, no atrial level shunt identified, no LAA thrombus -TTE 06/2017 LVEF 65 to 70%   ROS: Pt denies any chest discomfort, jaw pain, arm pain, palpitations, syncope, presyncope, orthopnea, PND, or LE edema.  Studies Reviewed I have independently reviewed the patient's ECG, previous cardiac testing, previous medical records.  Physical Exam VS:  BP 132/86 (BP Location: Left Arm, Patient Position: Sitting, Cuff Size: Normal)   Pulse (!) 109   Ht 5' 3 (1.6 m)   Wt 119 lb (54 kg)   LMP  (LMP Unknown)   SpO2 96%   BMI 21.08 kg/m        Wt Readings from Last 3 Encounters:  04/18/24 119 lb (54 kg)   04/12/24 119 lb (54 kg)  04/11/24 114 lb (51.7 kg)    GEN: No acute distress. NECK: No JVD; No carotid bruits. CARDIAC: Tachycardic with irregular rhythm, no murmurs, rubs, gallops. RESPIRATORY:  Clear to auscultation. EXTREMITIES:  Warm and well-perfused. No edema.  ASSESSMENT AND PLAN Permanent atrial fibrillation History of stroke Patient presents for further evaluation of atrial fibrillation.  She is in persistent atrial fibrillation with a heart rate of 109 today in clinic.  She is asymptomatic from this.  We had a long discussion regarding anticoagulation and CVA PPx, especially given her history of a stroke which was thought to be from atrial fibrillation.  She is not interested in starting a blood thinner at this time.  She says she is willing to except the stroke risk associated with atrial fibrillation.  We also discussed left atrial appendage occlusion devices, she is not interested in this either.  Since we are unable to do anticoagulation, we cannot pursue any rhythm control strategies.  She would prefer to avoid any medications under all circumstances anyways.  Therefore we will stick with a rate control approach.  Plan: - Zio monitor to assess mean heart rate; if her heart rate is less than 110, we can leave things as is.  If her heart rate is significantly greater than 110, then I will recommend a low-dose of metoprolol.  Does not seem as though she would want to take this medication, but this is the  agreement we came to. - We will hold off on echocardiogram since patient does not wish to have this done - As above, patient does not desire anticoagulation or any other kind of procedures or medications, so we will hold off on any further action  Status post ILR placement 2019 Patient has an ILR that was placed in 2019.  She would like this removed.  We will refer her to our EP colleagues in order to facilitate this.        Dispo: RTC as needed  Signed, Caron Poser, MD

## 2024-04-16 NOTE — Assessment & Plan Note (Signed)
 Noted on labs at last visit. Elevated fib 4 score. No hx of alcohol use or OTC medications/supplements. Has had some difficulty sleeping lately, and intermittent LE edema. Agreeable to laboratory investigation but declined US . Declined extensive workup for this.

## 2024-04-16 NOTE — Telephone Encounter (Signed)
 PAP: Patient assistance application for Xarelto has been approved by PAP Companies: [employer] from 04/15/2024 to 06/12/2025. Medication should be delivered to PAP Delivery: Home. For further shipping updates, please contact Vicci ODESSIA Vicci ([employer]) at [phone]. Patient ID is: approval letter in media

## 2024-04-16 NOTE — Assessment & Plan Note (Signed)
 Discussed treatment options including oral antifungals and risk of reinfection. She does not want to be on oral medications. Discussed home remedies including vinegar soaks and yellow Listerine.

## 2024-04-16 NOTE — Progress Notes (Signed)
 Established Patient Office Visit  Subjective   Patient ID: [name], female    DOB: September 10, 1935  Age: 88 y.o. MRN: [id number]  Chief Complaint  Patient presents with   Hypertension   Nail Problem    fungus    [name] with medical hx listed below presents today for follow up for hypertension. Daughter present in room with her today. Reports she is feeling well aside from difficulty sleeping lately. States she has been taking ASA, statin, and ARB but would like to stop after completing one month. Does not reports any specific side effect with the medications. States she does not have time to take medication. Please refer to problem based charting for further details and assessment and plan of current problem and chronic medical conditions.  Patient Active Problem List   Diagnosis Date Noted   Onychomycosis 04/16/2024   Transaminitis 04/16/2024   Memory change 04/16/2024   Leg edema, right 04/04/2024   History of CVA (cerebrovascular accident) 03/29/2024   Paroxysmal atrial fibrillation (HCC) 08/23/2017   Gait disturbance, post-stroke 07/18/2017   Cerebral hemorrhage (HCC) 07/11/2017   Cognitive deficit, post-stroke    Hypertension    Cerebrovascular accident (CVA) due to occlusion of left posterior communicating artery (HCC) 06/20/2017      ROS Refer to HPI    Objective:     Outpatient Encounter Medications as of 04/12/2024  Medication Sig   aspirin  EC 81 MG tablet Take 81 mg by mouth daily. Swallow whole.   [DISCONTINUED] atorvastatin  (LIPITOR) 40 MG tablet Take 1 tablet (40 mg total) by mouth daily.   [DISCONTINUED] olmesartan (BENICAR) 20 MG tablet Take 1 tablet (20 mg total) by mouth daily.   atorvastatin  (LIPITOR) 40 MG tablet Take 1 tablet (40 mg total) by mouth daily.   olmesartan (BENICAR) 20 MG tablet Take 1 tablet (20 mg total) by mouth daily.   rivaroxaban (XARELTO) 20 MG TABS tablet Take 1 tablet (20 mg total) by mouth daily with supper. (Patient  not taking: Reported on 04/12/2024)   No facility-administered encounter medications on file as of 04/12/2024.    BP 126/82   Pulse 82   Ht 5' 3 (1.6 m)   Wt 119 lb (54 kg)   LMP  (LMP Unknown)   SpO2 96%   BMI 21.08 kg/m  BP Readings from Last 3 Encounters:  04/12/24 126/82  03/29/24 (!) 160/98  03/15/19 (!) 177/88    Physical Exam Constitutional:      Comments: frail  HENT:     Mouth/Throat:     Mouth: Mucous membranes are moist.     Pharynx: Oropharynx is clear.  Eyes:     Extraocular Movements: Extraocular movements intact.     Conjunctiva/sclera: Conjunctivae normal.     Pupils: Pupils are equal, round, and reactive to light.  Cardiovascular:     Rate and Rhythm: Normal rate and regular rhythm.  Pulmonary:     Effort: Pulmonary effort is normal.     Breath sounds: No rhonchi or rales.  Abdominal:     General: Abdomen is flat. Bowel sounds are normal. There is no distension.     Palpations: Abdomen is soft.     Tenderness: There is no abdominal tenderness.  Musculoskeletal:        General: Normal range of motion.     Right lower leg: Edema (trace) present.     Left lower leg: Edema (trace) present.  Skin:    General: Skin is warm and  dry.     Capillary Refill: Capillary refill takes less than 2 seconds.     Comments: Chronic fungal changes to toenails particularly of bilateral great toes.  Neurological:     General: No focal deficit present.     Mental Status: She is alert and oriented to person, place, and time.     Gait: Gait abnormal.     Comments: Poor short term memory  Psychiatric:        Mood and Affect: Mood normal.        Behavior: Behavior normal.        04/11/2024    8:51 AM 03/29/2024    1:56 PM 07/18/2017    1:23 PM  Depression screen PHQ 2/9  Decreased Interest 0 0 0  Down, Depressed, Hopeless 0 0 0  PHQ - 2 Score 0 0 0  Altered sleeping 1 0   Tired, decreased energy 0 0   Change in appetite 0 0   Feeling bad or failure about  yourself  1 0   Trouble concentrating 1 1   Moving slowly or fidgety/restless 0 2   Suicidal thoughts 0 0   PHQ-9 Score 3 3   Difficult doing work/chores Somewhat difficult Not difficult at all        03/29/2024    1:56 PM  GAD 7 : Generalized Anxiety Score  Nervous, Anxious, on Edge 1  Control/stop worrying 1  Worry too much - different things 0  Trouble relaxing 0  Restless 0  Easily annoyed or irritable 1  Afraid - awful might happen 1  Total GAD 7 Score 4  Anxiety Difficulty Not difficult at all    Results for orders placed or performed in visit on 04/12/24  Comprehensive Metabolic Panel (CMET)  Result Value Ref Range   Glucose 114 (H) 70 - 99 mg/dL   BUN 21 8 - 27 mg/dL   Creatinine, Ser 9.14 0.57 - 1.00 mg/dL   eGFR 67 >40 fO/fpw/8.26   BUN/Creatinine Ratio 25 12 - 28   Sodium 141 134 - 144 mmol/L   Potassium 4.2 3.5 - 5.2 mmol/L   Chloride 103 96 - 106 mmol/L   CO2 23 20 - 29 mmol/L   Calcium  9.8 8.7 - 10.3 mg/dL   Total Protein 6.4 6.0 - 8.5 g/dL   Albumin 4.5 3.7 - 4.7 g/dL   Globulin, Total 1.9 1.5 - 4.5 g/dL   Bilirubin Total 0.9 0.0 - 1.2 mg/dL   Alkaline Phosphatase 101 48 - 129 IU/L   AST 60 (H) 0 - 40 IU/L   ALT 68 (H) 0 - 32 IU/L  B12  Result Value Ref Range   Vitamin B-12 217 (L) 232 - 1,245 pg/mL  INR/PT  Result Value Ref Range   INR 1.0 0.9 - 1.2   Prothrombin Time 10.8 9.1 - 12.0 sec  Iron, TIBC and Ferritin Panel  Result Value Ref Range   Total Iron Binding Capacity 332 250 - 450 ug/dL   UIBC 755 881 - 630 ug/dL   Iron 88 27 - 860 ug/dL   Iron Saturation 27 15 - 55 %   Ferritin 144 15 - 150 ng/mL  TSH  Result Value Ref Range   TSH 1.260 0.450 - 4.500 uIU/mL  Hepatitis C antibody  Result Value Ref Range   Hep C Virus Ab Non Reactive Non Reactive  Hepatitis B Core Antibody, total  Result Value Ref Range   Hep B Core Total Ab Negative Negative  Hepatitis B surface antibody,qualitative  Result Value Ref Range   Hep B Surface Ab, Qual  Non Reactive   Hepatitis B Surface AntiGEN  Result Value Ref Range   Hepatitis B Surface Ag Negative Negative      The ASCVD Risk score (Arnett DK, et al., 2019) failed to calculate for the following reasons:   The 2019 ASCVD risk score is only valid for ages 19 to 28   Risk score cannot be calculated because patient has a medical history suggesting prior/existing ASCVD    Assessment & Plan:  Primary hypertension Assessment & Plan: BP much better today. On olmesartan 20 mg daily. Uses sea salt rarely and canned vegetables. Discussed continuing medications for BP as she is tolerating this well. She will consider this. Please refer to problem based charting for further details and assessment and plan of current problem and chronic medical conditions.   Orders: -     Comprehensive metabolic panel with GFR -     Vitamin B12 -     Protime-INR -     Iron, TIBC and Ferritin Panel -     TSH  Transaminitis Assessment & Plan: Noted on labs at last visit. Elevated fib 4 score. No hx of alcohol use or OTC medications/supplements. Has had some difficulty sleeping lately, and intermittent LE edema. Agreeable to laboratory investigation but declined US . Declined extensive workup for this.   Orders: -     Protime-INR -     Iron, TIBC and Ferritin Panel -     Hepatitis C antibody -     Hepatitis B core antibody, total -     Hepatitis B surface antibody,qualitative -     Hepatitis B surface antigen  Thrombocytopenia -     Protime-INR -     Iron, TIBC and Ferritin Panel  Onychomycosis Assessment & Plan: Discussed treatment options including oral antifungals and risk of reinfection. She does not want to be on oral medications. Discussed home remedies including vinegar soaks and yellow Listerine.    Paroxysmal atrial fibrillation (HCC) Assessment & Plan: Currently taking baby ASA. Has not started on Xarelto is working on patient assistance. She will discuss with cardiology at appointment  next week.    Leg edema, right Assessment & Plan: This has resolved, no DVT on US . Continue to monitor.    Memory change Assessment & Plan: Reports difficulty with short term memory and driving longer distance/unfamiliar places. Denies anxiety for depression. Does not want to be on medication for memory issues. May have some element of vascular dementia.TSH and B12 today for reversible causes does eat a nearly vegan diet.   Other orders -     Olmesartan Medoxomil; Take 1 tablet (20 mg total) by mouth daily.  Dispense: 90 tablet; Refill: 3 -     Atorvastatin  Calcium ; Take 1 tablet (40 mg total) by mouth daily.  Dispense: 90 tablet; Refill: 3     Return in about 4 months (around 08/10/2024) for HTN, HLD.    Harlene Saddler, MD

## 2024-04-16 NOTE — Assessment & Plan Note (Signed)
 Currently taking baby ASA. Has not started on Xarelto is working on patient assistance. She will discuss with cardiology at appointment next week.

## 2024-04-16 NOTE — Assessment & Plan Note (Signed)
 This has resolved, no DVT on US . Continue to monitor.

## 2024-04-16 NOTE — Assessment & Plan Note (Signed)
 Reports difficulty with short term memory and driving longer distance/unfamiliar places. Denies anxiety for depression. Does not want to be on medication for memory issues. May have some element of vascular dementia.TSH and B12 today for reversible causes does eat a nearly vegan diet.

## 2024-04-18 ENCOUNTER — Ambulatory Visit

## 2024-04-18 VITALS — BP 132/86 | HR 109 | Ht 63.0 in | Wt 119.0 lb

## 2024-04-18 DIAGNOSIS — I4821 Permanent atrial fibrillation: Secondary | ICD-10-CM

## 2024-04-18 DIAGNOSIS — Z95818 Presence of other cardiac implants and grafts: Secondary | ICD-10-CM | POA: Diagnosis not present

## 2024-04-18 DIAGNOSIS — I4819 Other persistent atrial fibrillation: Secondary | ICD-10-CM

## 2024-04-18 DIAGNOSIS — Z8673 Personal history of transient ischemic attack (TIA), and cerebral infarction without residual deficits: Secondary | ICD-10-CM

## 2024-04-18 NOTE — Patient Instructions (Signed)
 Medication Instructions:  Your physician recommends that you continue on your current medications as directed. Please refer to the Current Medication list given to you today.  *If you need a refill on your cardiac medications before your next appointment, please call your pharmacy*  Lab Work: No labs ordered today  If you have labs (blood work) drawn today and your tests are completely normal, you will receive your results only by: MyChart Message (if you have MyChart) OR A paper copy in the mail If you have any lab test that is abnormal or we need to change your treatment, we will call you to review the results.  Testing/Procedures: GEOFFRY HEWS- Long Term Monitor Instructions  Your physician has requested you wear a ZIO patch monitor for 3 days.  This is a single patch monitor. [employer] supplies one patch monitor per enrollment. Additional stickers are not available. Please do not apply patch if you will be having a Nuclear Stress Test, Echocardiogram, Cardiac CT, MRI, or Chest Xray during the period you would be wearing the monitor. The patch cannot be worn during these tests. You cannot remove and re-apply the ZIO XT patch monitor.  Your ZIO patch monitor will be mailed 3 day USPS to your address on file. It may take 3-5 days to receive your monitor after you have been enrolled. Once you have received your monitor, please review the enclosed instructions. Your monitor has already been registered assigning a specific monitor serial number to you.  Billing and Patient Assistance Program Information  We have supplied [employer] with any of your insurance information on file for billing purposes.  [employer] offers a sliding scale Patient Assistance Program for patients that do not have insurance, or whose insurance does not completely cover the cost of the ZIO monitor.  You must apply for the Patient Assistance Program to qualify for this discounted rate.  To apply, please call [employer] at [phone],  select option 4, select option 2, ask to apply for Patient Assistance Program. Meredeth will ask your household income, and how many people are in your household. They will quote your out-of-pocket cost based on that information. [employer] will also be able to set up a 71-month, interest-free payment plan if needed.   After Applying Monitor: Do not shower for the first 24 hours. You may shower after the first 24 hours.  Press the button if you feel a symptom. You will hear a small click. Record Date, Time and Symptom in the Patient Logbook.   After Completing 3 Days: When you are ready to remove the patch, follow instructions on the last 2 pages of Patient Logbook.  Stick patch monitor into the tabs at the bottom of the return box.  Place Patient Logbook in the blue and white box. Use locking tab on box and tape box closed securely. The blue and white box has prepaid postage on it. Please place it in the mailbox as soon as possible. Your physician should have your test results approximately 7-14 days after the monitor has been mailed back to [facility].   Troubleshooting: Call [facility] at [phone] if you have questions regarding your ZIO XT patch monitor.  Call them immediately if you see an orange light blinking on your monitor.  If your monitor falls off in less than 4 days, contact our Monitor department at [phone].  If your monitor becomes loose or falls off after 4 days call [employer] at [phone] for suggestions on securing your monitor.   Follow-Up:  At [facility], you and your health needs are our priority.  As part of our continuing mission to provide you with exceptional heart care, our providers are all part of one team.  This team includes your primary Cardiologist (physician) and Advanced Practice Providers or APPs (Physician Assistants and Nurse Practitioners) who all work together to provide you with the care you need, when you need  it.  Your next appointment:   As Needed   Provider:   You may see Caron Poser, MD or one of the following Advanced Practice Providers on your designated Care Team:   Lonni Meager, NP Lesley Maffucci, PA-C Bernardino Bring, PA-C Cadence Wellston, PA-C Tylene Lunch, NP Barnie Hila, NP    We recommend signing up for the patient portal called MyChart.  Sign up information is provided on this After Visit Summary.  MyChart is used to connect with patients for Virtual Visits (Telemedicine).  Patients are able to view lab/test results, encounter notes, upcoming appointments, etc.  Non-urgent messages can be sent to your provider as well.   To learn more about what you can do with MyChart, go to [email or web address].

## 2024-04-24 ENCOUNTER — Other Ambulatory Visit: Payer: Self-pay | Admitting: Pharmacist

## 2024-04-24 DIAGNOSIS — I480 Paroxysmal atrial fibrillation: Secondary | ICD-10-CM

## 2024-04-24 NOTE — Progress Notes (Signed)
   04/24/2024 Name: [name] MRN: [id number] DOB: January 12, 1936  Chief Complaint  Patient presents with   Medication Assistance    [name] is a 88 y.o. year old female who was referred to the pharmacist by their PCP for assistance in managing medication access.    Today reach patient's daughter, [name], (listed on DPR in chart), as requested to follow up regarding medication access.     Subjective:   Care Team: Primary Care Provider: Lemon Raisin, MD ; Next Scheduled Visit: 08/16/2024 Cardiologist: Argentina Clap, MD; Next Scheduled Visit: 08/20/2024   Medication Access/Adherence  Current Pharmacy:  [id number] - [place], [place] - [address] [place] [address] Phone: [phone] Fax: [phone]   Patient reports affordability concerns with their medications: No Patient reports access/transportation concerns to their pharmacy: No  Patient reports adherence concerns with their medications:  No    Note patient was approved for patient assistance for Xarelto from [employer]. Daughter reports patient received supply from program, but never started taking.  From review of Cardiology Office Visit note in chart from 04/18/2024/per daughter today, patient has declined to start Xarelto or other anticoagulant at this time. States patient aware of stroke risk associated with remaining off of anticoagulant with having atrial fibrillation.   Objective:   Lab Results  Component Value Date   CREATININE 0.85 04/12/2024   BUN 21 04/12/2024   NA 141 04/12/2024   K 4.2 04/12/2024   CL 103 04/12/2024   CO2 23 04/12/2024    Current Outpatient Medications on File Prior to Visit  Medication Sig Dispense Refill   aspirin  EC 81 MG tablet Take 81 mg by mouth daily. Swallow whole.     atorvastatin  (LIPITOR) 40 MG tablet Take 1 tablet (40 mg total) by mouth daily. 90 tablet 3   olmesartan (BENICAR) 20 MG tablet Take 1 tablet (20 mg total) by  mouth daily. 90 tablet 3   rivaroxaban (XARELTO) 20 MG TABS tablet Take 1 tablet (20 mg total) by mouth daily with supper. (Patient not taking: Reported on 04/18/2024) 30 tablet 1   No current facility-administered medications on file prior to visit.        Assessment/Plan:   Provide daughter with information regarding mail order pharmacies as requested, including mail order through patient's insurance as well as [facility] mail order  Atrial Fibrillation: - Currently uncontrolled - Have previously reviewed indication of anticoagulant for stroke prevention. - Daughter plans to contact [employer] patient assistance program today to cancel any further shipments of Xarelto to patient  Follow Up Plan:   Patient denies further medication questions or concerns today Provide patient with contact information for clinic pharmacist to contact if needed in future for medication questions/concerns    [name], PharmD, [facility] [phone]

## 2024-05-02 ENCOUNTER — Ambulatory Visit: Payer: Self-pay

## 2024-05-02 DIAGNOSIS — I4819 Other persistent atrial fibrillation: Secondary | ICD-10-CM | POA: Diagnosis not present

## 2024-08-16 ENCOUNTER — Ambulatory Visit: Admitting: Student

## 2024-08-20 ENCOUNTER — Ambulatory Visit: Admitting: Cardiology

## 2025-04-23 ENCOUNTER — Ambulatory Visit
